# Patient Record
Sex: Female | Born: 1945
Health system: Southern US, Community
[De-identification: ages and names within clinical notes are randomized; demographics above are authoritative.]

## PROBLEM LIST (undated history)

## (undated) DIAGNOSIS — M1712 Unilateral primary osteoarthritis, left knee: Secondary | ICD-10-CM

## (undated) DIAGNOSIS — J45909 Unspecified asthma, uncomplicated: Secondary | ICD-10-CM

## (undated) DIAGNOSIS — I10 Essential (primary) hypertension: Secondary | ICD-10-CM

## (undated) DIAGNOSIS — G4700 Insomnia, unspecified: Secondary | ICD-10-CM

## (undated) DIAGNOSIS — M545 Low back pain, unspecified: Secondary | ICD-10-CM

## (undated) DIAGNOSIS — E785 Hyperlipidemia, unspecified: Secondary | ICD-10-CM

## (undated) DIAGNOSIS — E669 Obesity, unspecified: Secondary | ICD-10-CM

## (undated) HISTORY — DX: Low back pain, unspecified: M54.50

## (undated) HISTORY — DX: Insomnia, unspecified: G47.00

## (undated) HISTORY — PX: VAGINAL PROLAPSE REPAIR: SHX830

## (undated) HISTORY — DX: Unilateral primary osteoarthritis, left knee: M17.12

## (undated) HISTORY — DX: Hyperlipidemia, unspecified: E78.5

## (undated) HISTORY — DX: Low back pain: M54.5

## (undated) HISTORY — PX: OOPHORECTOMY: SHX86

## (undated) HISTORY — DX: Essential (primary) hypertension: I10

## (undated) HISTORY — PX: TUBAL LIGATION: SHX77

## (undated) HISTORY — DX: Obesity, unspecified: E66.9

## (undated) HISTORY — PX: ABDOMINAL HYSTERECTOMY: SHX81

---

## 2004-01-22 ENCOUNTER — Ambulatory Visit: Payer: Self-pay

## 2005-07-07 ENCOUNTER — Ambulatory Visit: Payer: Self-pay | Admitting: Family Medicine

## 2005-12-21 ENCOUNTER — Ambulatory Visit: Payer: Self-pay | Admitting: Unknown Physician Specialty

## 2006-07-21 ENCOUNTER — Ambulatory Visit: Payer: Self-pay

## 2006-08-04 ENCOUNTER — Ambulatory Visit: Payer: Self-pay | Admitting: Specialist

## 2006-10-12 ENCOUNTER — Ambulatory Visit: Payer: Self-pay | Admitting: Gastroenterology

## 2007-03-13 ENCOUNTER — Other Ambulatory Visit: Payer: Self-pay

## 2007-03-13 ENCOUNTER — Ambulatory Visit: Payer: Self-pay | Admitting: Unknown Physician Specialty

## 2007-03-22 ENCOUNTER — Inpatient Hospital Stay: Payer: Self-pay | Admitting: Unknown Physician Specialty

## 2007-09-27 ENCOUNTER — Ambulatory Visit: Payer: Self-pay | Admitting: Family Medicine

## 2009-09-24 DIAGNOSIS — M1710 Unilateral primary osteoarthritis, unspecified knee: Secondary | ICD-10-CM

## 2009-10-07 ENCOUNTER — Ambulatory Visit: Payer: Self-pay | Admitting: Family Medicine

## 2010-03-25 ENCOUNTER — Ambulatory Visit: Payer: Self-pay | Admitting: Family Medicine

## 2010-04-05 ENCOUNTER — Ambulatory Visit: Payer: Self-pay | Admitting: Orthopedic Surgery

## 2010-04-15 ENCOUNTER — Inpatient Hospital Stay: Payer: Self-pay | Admitting: Orthopedic Surgery

## 2010-04-18 HISTORY — PX: KNEE SURGERY: SHX244

## 2010-04-20 LAB — PATHOLOGY REPORT

## 2010-07-08 ENCOUNTER — Ambulatory Visit: Payer: Self-pay | Admitting: Orthopedic Surgery

## 2010-07-22 ENCOUNTER — Ambulatory Visit: Payer: Self-pay | Admitting: Pain Medicine

## 2010-08-04 ENCOUNTER — Ambulatory Visit: Payer: Self-pay | Admitting: Pain Medicine

## 2010-08-04 ENCOUNTER — Other Ambulatory Visit: Payer: Self-pay | Admitting: Pain Medicine

## 2010-08-05 ENCOUNTER — Ambulatory Visit: Payer: Self-pay | Admitting: Pain Medicine

## 2010-08-19 ENCOUNTER — Ambulatory Visit: Payer: Self-pay | Admitting: Pain Medicine

## 2010-08-30 ENCOUNTER — Ambulatory Visit: Payer: Self-pay | Admitting: Pain Medicine

## 2012-09-13 ENCOUNTER — Ambulatory Visit: Payer: Self-pay | Admitting: Family Medicine

## 2013-01-10 ENCOUNTER — Ambulatory Visit: Payer: Self-pay | Admitting: Family Medicine

## 2013-01-18 ENCOUNTER — Ambulatory Visit: Payer: Self-pay | Admitting: Family Medicine

## 2013-01-21 ENCOUNTER — Ambulatory Visit: Payer: Self-pay | Admitting: Family Medicine

## 2013-01-21 HISTORY — PX: BREAST BIOPSY: SHX20

## 2013-01-24 ENCOUNTER — Ambulatory Visit: Payer: Self-pay | Admitting: General Surgery

## 2013-02-05 ENCOUNTER — Ambulatory Visit: Payer: Self-pay | Admitting: General Surgery

## 2014-07-20 IMAGING — MG MM BREAST BX W/ LOC DEV 1ST LESION IMAGE BX SPEC STEREO GUIDE*R*
1 series · 1 of 1 positions shown · non-contrast
Comparison: Previous exams.

REASON FOR EXAM: rt calcs
COMMENTS:
CLINICAL DATA: Right breast calcifications from biopsy

EXAM:
STEREOTACTIC VACUUM ASSIST RIGHT

[R CC]
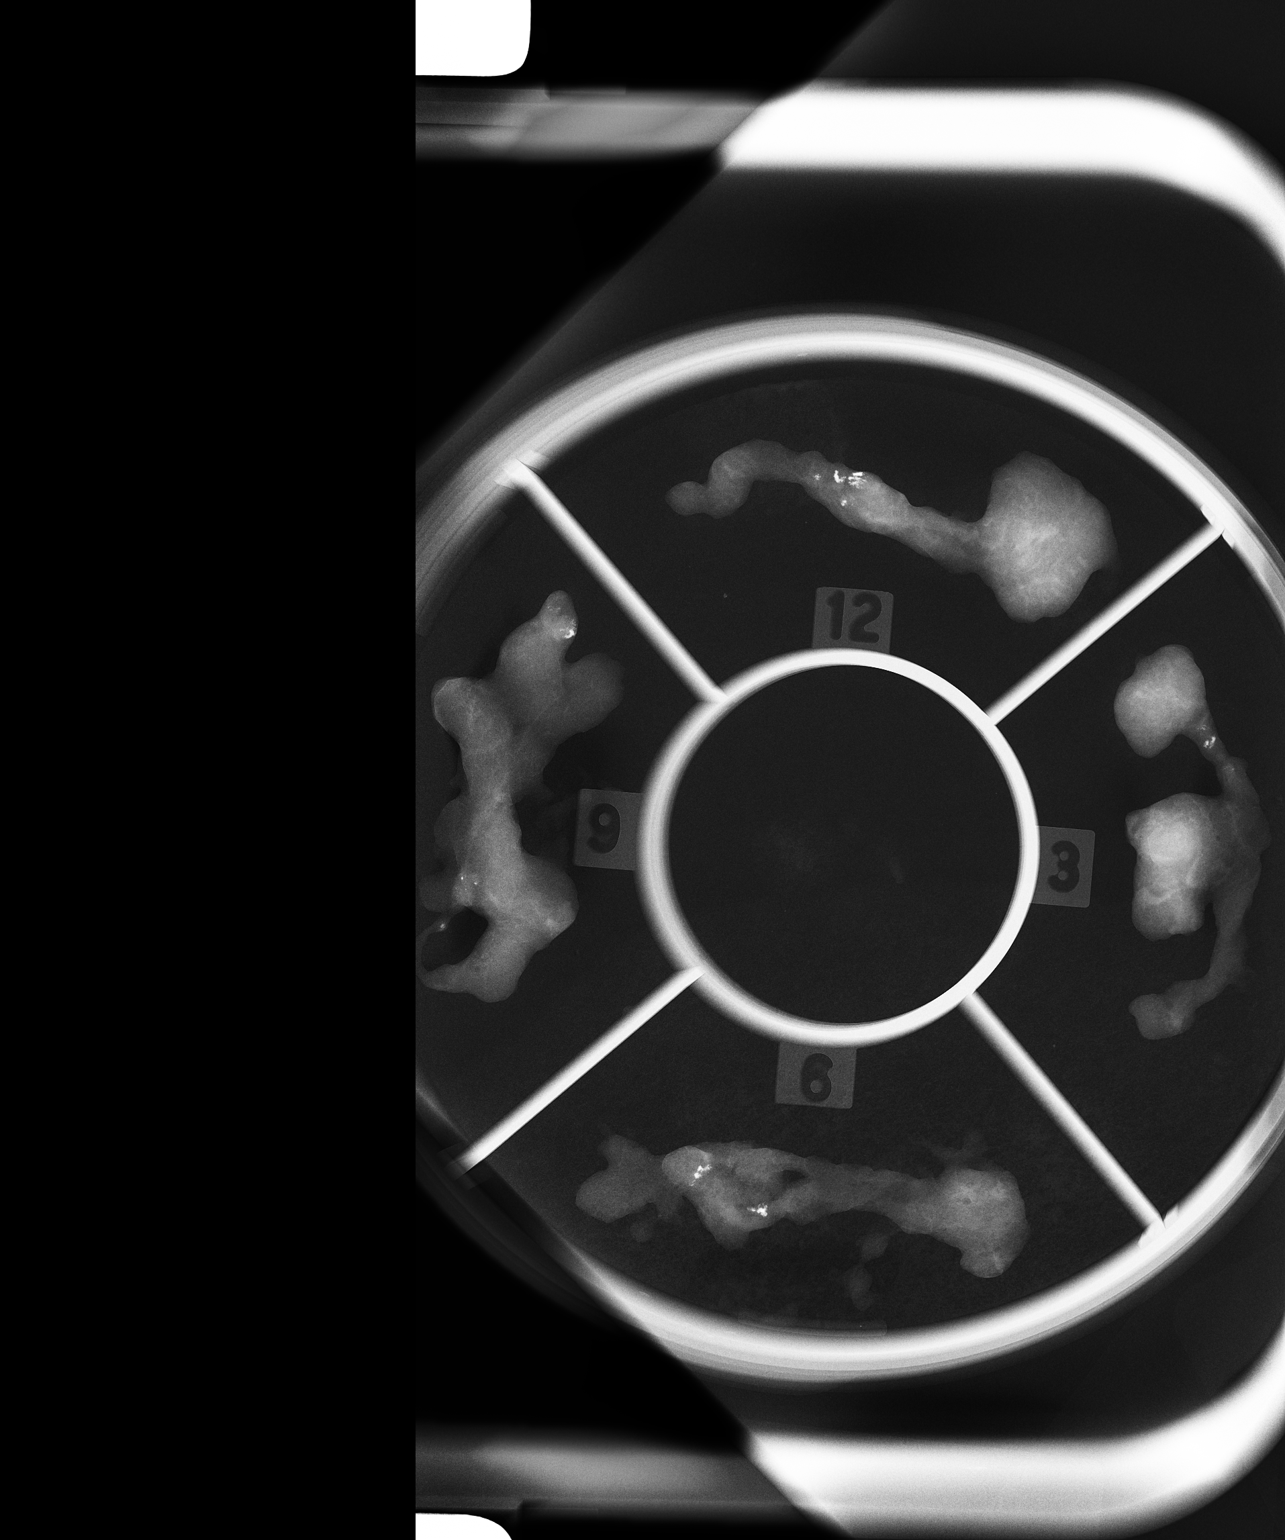

[1 of 1 positions shown; findings below may reference images not displayed]

PROCEDURE:     MAM - MAM STEREOTACTIC VACUUM ASSIST R  - January 21, 2013  [DATE]

ADDENDUM:
PATHOLOGY ADDENDUM:

Pathologyright breast calcifications: Benign fibroadenoma with
calcifications

Pathology concordance with imaging findings: Yes

Recommendation: Screening mammogram in January 2014

At the request of the patient, I spoke with her by telephone on
01/23/2013 at [DATE]. She reports doing well after the biopsy. She
reports no bleeding or bruising at the biopsy site..
FINDINGS: I met with the patient and we discussed the procedure of
stereotactic-guided biopsy, including benefits and alternatives. We
discussed the high likelihood of a successful procedure. We
discussed the risks of the procedure, including infection, bleeding,
tissue injury, clip migration, and inadequate sampling. Informed,
written consent was given.

Using sterile technique and 2% Lidocaine as local anesthetic, under
stereotactic guidance, a 9 gauge stereotactic vacuum assisted device
was used to perform core needle biopsy of calcifications in the
central posterior right breast using a medial approach. Specimen
radiograph was performed, showing inclusion of calcifications of
concern. Specimens with calcifications are identified for pathology.

At the conclusion of the procedure, a tissue marker clip was
deployed into the biopsy cavity. Follow-up 2-view mammogram
confirmed clip in the area of concern
IMPRESSION: Stereotactic-guided biopsy of right breast. No apparent
complications.

## 2014-08-28 DIAGNOSIS — H5210 Myopia, unspecified eye: Secondary | ICD-10-CM | POA: Diagnosis not present

## 2014-08-28 DIAGNOSIS — H524 Presbyopia: Secondary | ICD-10-CM | POA: Diagnosis not present

## 2014-10-27 ENCOUNTER — Encounter: Payer: Self-pay | Admitting: Family Medicine

## 2014-10-27 DIAGNOSIS — F5104 Psychophysiologic insomnia: Secondary | ICD-10-CM | POA: Insufficient documentation

## 2014-10-27 DIAGNOSIS — R51 Headache: Secondary | ICD-10-CM

## 2014-10-27 DIAGNOSIS — G4726 Circadian rhythm sleep disorder, shift work type: Secondary | ICD-10-CM | POA: Insufficient documentation

## 2014-10-27 DIAGNOSIS — G4700 Insomnia, unspecified: Secondary | ICD-10-CM | POA: Insufficient documentation

## 2014-10-27 DIAGNOSIS — E559 Vitamin D deficiency, unspecified: Secondary | ICD-10-CM | POA: Insufficient documentation

## 2014-10-27 DIAGNOSIS — R001 Bradycardia, unspecified: Secondary | ICD-10-CM | POA: Insufficient documentation

## 2014-10-27 DIAGNOSIS — E7800 Pure hypercholesterolemia, unspecified: Secondary | ICD-10-CM | POA: Insufficient documentation

## 2014-10-27 DIAGNOSIS — R519 Headache, unspecified: Secondary | ICD-10-CM | POA: Insufficient documentation

## 2014-10-27 DIAGNOSIS — I10 Essential (primary) hypertension: Secondary | ICD-10-CM | POA: Insufficient documentation

## 2014-10-27 DIAGNOSIS — E669 Obesity, unspecified: Secondary | ICD-10-CM | POA: Insufficient documentation

## 2014-10-30 ENCOUNTER — Ambulatory Visit (INDEPENDENT_AMBULATORY_CARE_PROVIDER_SITE_OTHER): Payer: BLUE CROSS/BLUE SHIELD | Admitting: Family Medicine

## 2014-10-30 ENCOUNTER — Encounter: Payer: Self-pay | Admitting: Family Medicine

## 2014-10-30 VITALS — BP 124/82 | HR 82 | Temp 97.8°F | Resp 18 | Ht 65.0 in | Wt 181.0 lb

## 2014-10-30 DIAGNOSIS — G8929 Other chronic pain: Secondary | ICD-10-CM | POA: Insufficient documentation

## 2014-10-30 DIAGNOSIS — Z1239 Encounter for other screening for malignant neoplasm of breast: Secondary | ICD-10-CM

## 2014-10-30 DIAGNOSIS — H6993 Unspecified Eustachian tube disorder, bilateral: Secondary | ICD-10-CM | POA: Diagnosis not present

## 2014-10-30 DIAGNOSIS — M170 Bilateral primary osteoarthritis of knee: Secondary | ICD-10-CM

## 2014-10-30 DIAGNOSIS — H6121 Impacted cerumen, right ear: Secondary | ICD-10-CM | POA: Diagnosis not present

## 2014-10-30 DIAGNOSIS — I10 Essential (primary) hypertension: Secondary | ICD-10-CM

## 2014-10-30 DIAGNOSIS — E7800 Pure hypercholesterolemia, unspecified: Secondary | ICD-10-CM

## 2014-10-30 DIAGNOSIS — G4726 Circadian rhythm sleep disorder, shift work type: Secondary | ICD-10-CM

## 2014-10-30 DIAGNOSIS — E559 Vitamin D deficiency, unspecified: Secondary | ICD-10-CM

## 2014-10-30 DIAGNOSIS — F5104 Psychophysiologic insomnia: Secondary | ICD-10-CM

## 2014-10-30 DIAGNOSIS — M545 Low back pain, unspecified: Secondary | ICD-10-CM | POA: Insufficient documentation

## 2014-10-30 MED ORDER — PRAVASTATIN SODIUM 40 MG PO TABS: 40.0000 mg | ORAL_TABLET | Freq: Every day | ORAL | Status: DC

## 2014-10-30 MED ORDER — ZOLPIDEM TARTRATE ER 12.5 MG PO TBCR: 12.5000 mg | EXTENDED_RELEASE_TABLET | Freq: Every day | ORAL | Status: DC

## 2014-10-30 MED ORDER — LISINOPRIL-HYDROCHLOROTHIAZIDE 20-12.5 MG PO TABS: 1.0000 | ORAL_TABLET | Freq: Two times a day (BID) | ORAL | Status: DC

## 2014-10-30 MED ORDER — TRAMADOL HCL 50 MG PO TABS: 50.0000 mg | ORAL_TABLET | Freq: Two times a day (BID) | ORAL | Status: DC | PRN

## 2014-10-30 MED ORDER — CYCLOBENZAPRINE HCL 10 MG PO TABS: 10.0000 mg | ORAL_TABLET | ORAL | Status: DC | PRN

## 2014-10-30 MED ORDER — FLUTICASONE PROPIONATE 50 MCG/ACT NA SUSP: 2.0000 | Freq: Every day | NASAL | Status: DC

## 2014-10-30 MED ORDER — AMLODIPINE BESYLATE 5 MG PO TABS: 5.0000 mg | ORAL_TABLET | Freq: Every day | ORAL | Status: DC

## 2014-10-30 NOTE — Patient Instructions (Signed)
Barotitis Media Barotitis media is inflammation of your middle ear. This occurs when the auditory tube (eustachian tube) leading from the back of your nose (nasopharynx) to your eardrum is blocked. This blockage may result from a cold, environmental allergies, or an upper respiratory infection. Unresolved barotitis media may lead to damage or hearing loss (barotrauma), which may become permanent. HOME CARE INSTRUCTIONS   Use medicines as recommended by your health care provider. Over-the-counter medicines will help unblock the canal and can help during times of air travel.  Do not put anything into your ears to clean or unplug them. Eardrops will not be helpful.  Do not swim, dive, or fly until your health care provider says it is all right to do so. If these activities are necessary, chewing gum with frequent, forceful swallowing may help. It is also helpful to hold your nose and gently blow to pop your ears for equalizing pressure changes. This forces air into the eustachian tube.  Only take over-the-counter or prescription medicines for pain, discomfort, or fever as directed by your health care provider.  A decongestant may be helpful in decongesting the middle ear and make pressure equalization easier. SEEK MEDICAL CARE IF:  You experience a serious form of dizziness in which you feel as if the room is spinning and you feel nauseated (vertigo).  Your symptoms only involve one ear. SEEK IMMEDIATE MEDICAL CARE IF:   You develop a severe headache, dizziness, or severe ear pain.  You have bloody or pus-like drainage from your ears.  You develop a fever.  Your problems do not improve or become worse. MAKE SURE YOU:   Understand these instructions.  Will watch your condition.  Will get help right away if you are not doing well or get worse. Document Released: 04/01/2000 Document Revised: 01/23/2013 Document Reviewed: 10/30/2012 Exit[employer] Patient Information 2015 [employer]. This  information is not intended to replace advice given to you by your health care provider. Make sure you discuss any questions you have with your health care provider.  

## 2014-10-30 NOTE — Progress Notes (Signed)
Name: Hayley Howe   MRN: [id number]    DOB: April 23, 1945   Date:10/30/2014       Progress Note  Subjective  Chief Complaint  Chief Complaint  Patient presents with  . Hypertension  . Hyperlipidemia  . Insomnia    pt states ambien not helping total sleep 6hrs, waking up and cannot go back to sleep  . Back Pain    unchanged  . Cerumen Impaction    possibly she states from cold    HPI  HTN: taking medication, denies side effects of medication, bp is at goal.  Hyperlipidemia: taking pravastatin and denies side effects of medication, due for labs  Chronic Low Back: she has a long history of low back pain, not secondary to trauma, DDD lumbar spine, taking Tramadol daily to control symptoms. Occasionally the pain radiates to right thigh, usually is described as a aching pain and sometimes sharp.  Pain at this time is 2/10.  Insomnia/Shift Work Sleep Disorder: she sleeps for about 6.5 hours when she sleeps during the day and about 5 hours during the night. Works third shift. Taking Ambien  Ear fullness: she states she had a cold /allergies for about one month and ears have been stopped up, feeling better but still not back to normal. Right is worse than left side  Patient Active Problem List   Diagnosis Date Noted  . Chronic low back pain 10/30/2014  . Benign essential HTN 10/27/2014  . Bradycardia 10/27/2014  . Insomnia 10/27/2014  . Headache, temporal 10/27/2014  . Hypercholesteremia 10/27/2014  . Obesity (BMI 30-39.9) 10/27/2014  . Changing sleep-work schedule, affecting sleep 10/27/2014  . Vitamin D deficiency 10/27/2014  . Arthritis of knee, degenerative 09/24/2009    No past surgical history on file.  No family history on file.  History   Social History  . Marital Status: Widowed    Spouse Name: N/A  . Number of Children: N/A  . Years of Education: N/A   Occupational History  . Not on file.   Social History Main Topics  . Smoking status: Former Research scientist (life sciences)  .  Smokeless tobacco: Never Used  . Alcohol Use: No  . Drug Use: No  . Sexual Activity: Not Currently   Other Topics Concern  . Not on file   Social History Narrative  . No narrative on file     Current outpatient prescriptions:  .  amLODipine (NORVASC) 5 MG tablet, Take 1 tablet (5 mg total) by mouth daily., Disp: 30 tablet, Rfl: 2 .  aspirin 81 MG tablet, Take 1 tablet by mouth daily., Disp: , Rfl:  .  Cholecalciferol (VITAMIN D) 2000 UNITS tablet, Take 1 tablet by mouth daily., Disp: , Rfl:  .  cyclobenzaprine (FLEXERIL) 10 MG tablet, Take 1 tablet (10 mg total) by mouth as needed., Disp: 30 tablet, Rfl: 2 .  lisinopril-hydrochlorothiazide (PRINZIDE,ZESTORETIC) 20-12.5 MG per tablet, Take 1 tablet by mouth 2 (two) times daily., Disp: 60 tablet, Rfl: 2 .  pravastatin (PRAVACHOL) 40 MG tablet, Take 1 tablet (40 mg total) by mouth daily., Disp: 30 tablet, Rfl: 2 .  traMADol (ULTRAM) 50 MG tablet, Take 1 tablet (50 mg total) by mouth 2 (two) times daily as needed., Disp: 60 tablet, Rfl: 2 .  zolpidem (AMBIEN CR) 12.5 MG CR tablet, Take 1 tablet (12.5 mg total) by mouth at bedtime., Disp: 30 tablet, Rfl: 2  No Known Allergies   ROS  Constitutional: Negative for fever or significant weight change.  Respiratory: Negative  for cough and shortness of breath.   Cardiovascular: Negative for chest pain or palpitations.  Gastrointestinal: Negative for abdominal pain, no bowel changes.  Musculoskeletal: Negative for gait problem or joint swelling.  Skin: Negative for rash.  Neurological: Negative for dizziness or headache.  No other specific complaints in a complete review of systems (except as listed in HPI above).  Objective  Filed Vitals:   10/30/14 1346  BP: 124/82  Pulse: 82  Temp: 97.8 F (36.6 C)  TempSrc: Oral  Resp: 18  Height: 5\' 5"  (1.651 m)  Weight: 181 lb (82.101 kg)  SpO2: 97%    Body mass index is 30.12 kg/(m^2).  Physical Exam  Constitutional: Patient appears  well-developed and well-nourished. No distress. Obese Eyes:  No scleral icterus. PERL Ears: cerumen impaction on right side Neck: Normal range of motion. Neck supple.  Cardiovascular: Normal rate, regular rhythm and normal heart sounds.  No murmur heard. No BLE edema. Pulmonary/Chest: Effort normal and breath sounds normal. No respiratory distress. Abdominal: Soft.  There is no tenderness. Psychiatric: Patient has a normal mood and affect. behavior is normal. Judgment and thought content normal.   PHQ2/9: Depression screen PHQ 2/9 10/30/2014  Decreased Interest 0  Down, Depressed, Hopeless 1  PHQ - 2 Score 1    Fall Risk: Fall Risk  10/30/2014  Falls in the past year? No     Assessment & Plan  1. Benign essential HTN At goal continue medication  - Comprehensive Metabolic Panel (CMET) - lisinopril-hydrochlorothiazide (PRINZIDE,ZESTORETIC) 20-12.5 MG per tablet; Take 1 tablet by mouth 2 (two) times daily.  Dispense: 60 tablet; Refill: 2 - amLODipine (NORVASC) 5 MG tablet; Take 1 tablet (5 mg total) by mouth daily.  Dispense: 30 tablet; Refill: 2  2. Hypercholesteremia Recheck labs - Lipid Profile - pravastatin (PRAVACHOL) 40 MG tablet; Take 1 tablet (40 mg total) by mouth daily.  Dispense: 30 tablet; Refill: 2  3. Vitamin D deficiency  - Vitamin D (25 hydroxy)  4. Primary osteoarthritis of both knees Discussed quad strength exercises  5. Chronic low back pain  - traMADol (ULTRAM) 50 MG tablet; Take 1 tablet (50 mg total) by mouth 2 (two) times daily as needed.  Dispense: 60 tablet; Refill: 2 - cyclobenzaprine (FLEXERIL) 10 MG tablet; Take 1 tablet (10 mg total) by mouth as needed.  Dispense: 30 tablet; Refill: 2  6. Changing sleep-work schedule, affecting sleep Continue Ambien for sleep   7. Chronic insomnia  - zolpidem (AMBIEN CR) 12.5 MG CR tablet; Take 1 tablet (12.5 mg total) by mouth at bedtime.  Dispense: 30 tablet; Refill: 2  8. Breast cancer screening  -  MM Digital Screening; Future  9. Cerumen impaction, right  - Ear Lavage Verbal consent given by patient She tolerated procedure well, elephant ear lavage with warm water and peroxide No complications   10. Eustachian tube disorder, bilateral  - fluticasone (FLONASE) 50 MCG/ACT nasal spray; Place 2 sprays into both nostrils daily.  Dispense: 16 g; Refill: 0

## 2014-10-31 ENCOUNTER — Telehealth: Payer: Self-pay

## 2014-10-31 LAB — COMPREHENSIVE METABOLIC PANEL
A/G RATIO: 1.3 (ref 1.1–2.5)
ALBUMIN: 4.6 g/dL (ref 3.6–4.8)
ALK PHOS: 57 IU/L (ref 39–117)
ALT: 20 IU/L (ref 0–32)
AST: 21 IU/L (ref 0–40)
BUN / CREAT RATIO: 21 (ref 11–26)
BUN: 19 mg/dL (ref 8–27)
Bilirubin Total: 0.2 mg/dL (ref 0.0–1.2)
CO2: 27 mmol/L (ref 18–29)
Calcium: 9.8 mg/dL (ref 8.7–10.3)
Chloride: 98 mmol/L (ref 97–108)
Creatinine, Ser: 0.89 mg/dL (ref 0.57–1.00)
GFR calc Af Amer: 78 mL/min/{1.73_m2} (ref >59)
GFR calc non Af Amer: 67 mL/min/{1.73_m2} (ref >59)
Globulin, Total: 3.5 g/dL (ref 1.5–4.5)
Glucose: 93 mg/dL (ref 65–99)
Potassium: 3.8 mmol/L (ref 3.5–5.2)
Sodium: 141 mmol/L (ref 134–144)
TOTAL PROTEIN: 8.1 g/dL (ref 6.0–8.5)

## 2014-10-31 LAB — LIPID PANEL
CHOL/HDL RATIO: 2.2 ratio (ref 0.0–4.4)
Cholesterol, Total: 197 mg/dL (ref 100–199)
HDL: 89 mg/dL (ref >39)
LDL Calculated: 97 mg/dL (ref 0–99)
Triglycerides: 57 mg/dL (ref 0–149)
VLDL CHOLESTEROL CAL: 11 mg/dL (ref 5–40)

## 2014-10-31 LAB — VITAMIN D 25 HYDROXY (VIT D DEFICIENCY, FRACTURES): Vit D, 25-Hydroxy: 32.5 ng/mL (ref 30.0–100.0)

## 2014-10-31 NOTE — Telephone Encounter (Signed)
Left message for patient to return my call due to labs.

## 2014-10-31 NOTE — Telephone Encounter (Signed)
-----   Message from Steele Sizer, MD sent at 10/31/2014 10:02 AM EDT ----- Labs reviewed and within normal limits Continue current medications Please notify patient, thank you

## 2014-11-03 ENCOUNTER — Telehealth: Payer: Self-pay

## 2014-11-03 NOTE — Telephone Encounter (Signed)
Left vm for patient to return my call for lab results. 

## 2014-11-03 NOTE — Telephone Encounter (Signed)
-----   Message from Steele Sizer, MD sent at 10/31/2014 10:02 AM EDT ----- Labs reviewed and within normal limits Continue current medications Please notify patient, thank you

## 2014-11-04 NOTE — Progress Notes (Signed)
Patient notified

## 2015-01-26 ENCOUNTER — Other Ambulatory Visit: Payer: Self-pay | Admitting: Family Medicine

## 2015-01-26 NOTE — Telephone Encounter (Signed)
Patient requesting refill. 

## 2015-02-25 DIAGNOSIS — M4696 Unspecified inflammatory spondylopathy, lumbar region: Secondary | ICD-10-CM | POA: Diagnosis not present

## 2015-02-27 ENCOUNTER — Other Ambulatory Visit: Payer: Self-pay | Admitting: Family Medicine

## 2015-03-05 ENCOUNTER — Ambulatory Visit (INDEPENDENT_AMBULATORY_CARE_PROVIDER_SITE_OTHER): Payer: BLUE CROSS/BLUE SHIELD | Admitting: Family Medicine

## 2015-03-05 ENCOUNTER — Encounter: Payer: Self-pay | Admitting: Family Medicine

## 2015-03-05 VITALS — BP 136/82 | HR 100 | Temp 98.3°F | Resp 14 | Ht 65.0 in | Wt 181.9 lb

## 2015-03-05 DIAGNOSIS — Z23 Encounter for immunization: Secondary | ICD-10-CM | POA: Diagnosis not present

## 2015-03-05 DIAGNOSIS — Z1231 Encounter for screening mammogram for malignant neoplasm of breast: Secondary | ICD-10-CM

## 2015-03-05 DIAGNOSIS — G8929 Other chronic pain: Secondary | ICD-10-CM | POA: Diagnosis not present

## 2015-03-05 DIAGNOSIS — M545 Low back pain, unspecified: Secondary | ICD-10-CM

## 2015-03-05 DIAGNOSIS — I10 Essential (primary) hypertension: Secondary | ICD-10-CM

## 2015-03-05 DIAGNOSIS — G4700 Insomnia, unspecified: Secondary | ICD-10-CM | POA: Diagnosis not present

## 2015-03-05 DIAGNOSIS — E7800 Pure hypercholesterolemia, unspecified: Secondary | ICD-10-CM

## 2015-03-05 DIAGNOSIS — M170 Bilateral primary osteoarthritis of knee: Secondary | ICD-10-CM | POA: Diagnosis not present

## 2015-03-05 DIAGNOSIS — H6993 Unspecified Eustachian tube disorder, bilateral: Secondary | ICD-10-CM | POA: Diagnosis not present

## 2015-03-05 DIAGNOSIS — J302 Other seasonal allergic rhinitis: Secondary | ICD-10-CM | POA: Diagnosis not present

## 2015-03-05 DIAGNOSIS — F5104 Psychophysiologic insomnia: Secondary | ICD-10-CM

## 2015-03-05 MED ORDER — LISINOPRIL-HYDROCHLOROTHIAZIDE 20-12.5 MG PO TABS: 1.0000 | ORAL_TABLET | Freq: Two times a day (BID) | ORAL | Status: DC

## 2015-03-05 MED ORDER — FLUTICASONE PROPIONATE 50 MCG/ACT NA SUSP: 2.0000 | Freq: Every day | NASAL | Status: DC

## 2015-03-05 MED ORDER — LORATADINE 10 MG PO TABS: 10.0000 mg | ORAL_TABLET | Freq: Every day | ORAL | Status: DC

## 2015-03-05 MED ORDER — TRAMADOL HCL 50 MG PO TABS: 50.0000 mg | ORAL_TABLET | Freq: Two times a day (BID) | ORAL | Status: DC | PRN

## 2015-03-05 MED ORDER — ZOLPIDEM TARTRATE ER 12.5 MG PO TBCR: 12.5000 mg | EXTENDED_RELEASE_TABLET | Freq: Every day | ORAL | Status: DC

## 2015-03-05 MED ORDER — AMLODIPINE BESYLATE 5 MG PO TABS: 5.0000 mg | ORAL_TABLET | Freq: Every day | ORAL | Status: DC

## 2015-03-05 MED ORDER — PRAVASTATIN SODIUM 40 MG PO TABS: 40.0000 mg | ORAL_TABLET | Freq: Every day | ORAL | Status: DC

## 2015-03-05 NOTE — Progress Notes (Signed)
Name: Hayley Howe   MRN: [id number]    DOB: 1946/01/21   Date:03/05/2015       Progress Note  Subjective  Chief Complaint  Chief Complaint  Patient presents with  . Medication Refill    follow-up  . Hypertension    some headaches  . Hyperlipidemia  . Insomnia  . Pain    back and neck  . URI    onset 4 day unchanged, syptoms runny nose coughing, scratchy throat.  Taking otc meds    HPI   HTN: taking medication, denies side effects of medication, bp is at goal, no chest pain or palpitation   Hyperlipidemia: taking pravastatin and denies side effects of medication, labs reviewed HDL excellent and LDL is at goal   Chronic Low Back: she has a long history of low back pain, not secondary to trauma, DDD lumbar spine, taking Tramadol daily to control symptoms. Occasionally the pain radiates to right thigh, usually is described as a aching pain and sometimes sharp. Pain at this time is 0/10 at this time, but she was off last night from work.  Had a flare within the past couple of weeks and had a trigger point injection done by Dr. Rudene Christians, she states symptoms have improved since.   Insomnia/Shift Work Sleep Disorder: she sleeps for about 6.5 hours when she sleeps during the day and about 5 hours during the night. Works third shift. Taking Ambien, denies side effects of medication   AR: she states that for the past month or so, she has noticed rhinorrhea, nasal congestion, ear fullness at times, and post-nasal drainage,no fever, no chills, no change in appetite.   Patient Active Problem List   Diagnosis Date Noted  . Chronic low back pain 10/30/2014  . Benign essential HTN 10/27/2014  . Bradycardia 10/27/2014  . Insomnia 10/27/2014  . Headache, temporal 10/27/2014  . Hypercholesteremia 10/27/2014  . Obesity (BMI 30-39.9) 10/27/2014  . Changing sleep-work schedule, affecting sleep 10/27/2014  . Vitamin D deficiency 10/27/2014  . Arthritis of knee, degenerative 09/24/2009     History reviewed. No pertinent past surgical history.  History reviewed. No pertinent family history.  Social History   Social History  . Marital Status: Widowed    Spouse Name: N/A  . Number of Children: N/A  . Years of Education: N/A   Occupational History  . Not on file.   Social History Main Topics  . Smoking status: Former Research scientist (life sciences)  . Smokeless tobacco: Never Used  . Alcohol Use: No  . Drug Use: No  . Sexual Activity: Not Currently   Other Topics Concern  . Not on file   Social History Narrative     Current outpatient prescriptions:  .  amLODipine (NORVASC) 5 MG tablet, Take 1 tablet (5 mg total) by mouth daily., Disp: 30 tablet, Rfl: 5 .  aspirin 81 MG tablet, Take 1 tablet by mouth daily., Disp: , Rfl:  .  Cholecalciferol (VITAMIN D) 2000 UNITS tablet, Take 1 tablet by mouth daily., Disp: , Rfl:  .  cyclobenzaprine (FLEXERIL) 10 MG tablet, Take 1 tablet (10 mg total) by mouth as needed., Disp: 30 tablet, Rfl: 2 .  fluticasone (FLONASE) 50 MCG/ACT nasal spray, Place 2 sprays into both nostrils daily., Disp: 16 g, Rfl: 2 .  lisinopril-hydrochlorothiazide (PRINZIDE,ZESTORETIC) 20-12.5 MG tablet, Take 1 tablet by mouth 2 (two) times daily., Disp: 60 tablet, Rfl: 5 .  Magnesium 250 MG TABS, Take 1 tablet by mouth daily., Disp: , Rfl:  .  pravastatin (PRAVACHOL) 40 MG tablet, Take 1 tablet (40 mg total) by mouth daily., Disp: 30 tablet, Rfl: 5 .  traMADol (ULTRAM) 50 MG tablet, Take 1 tablet (50 mg total) by mouth 2 (two) times daily as needed., Disp: 60 tablet, Rfl: 2 .  vitamin C (ASCORBIC ACID) 500 MG tablet, Take 1 tablet by mouth daily., Disp: , Rfl:  .  zolpidem (AMBIEN CR) 12.5 MG CR tablet, Take 1 tablet (12.5 mg total) by mouth at bedtime., Disp: 30 tablet, Rfl: 2  No Known Allergies   ROS  Constitutional: Negative for fever or weight change.  Respiratory: Mild cough from post-nasal drainage and shortness of breath.   Cardiovascular: Negative for chest  pain or palpitations.  Gastrointestinal: Negative for abdominal pain, no bowel changes.  Musculoskeletal: Negative for gait problem or joint swelling.  Skin: Negative for rash.  Neurological: Negative for dizziness or headache.  No other specific complaints in a complete review of systems (except as listed in HPI above).  Objective  Filed Vitals:   03/05/15 1333  BP: 136/82  Pulse: 100  Temp: 98.3 F (36.8 C)  TempSrc: Oral  Resp: 14  Height: 5\' 5"  (1.651 m)  Weight: 181 lb 14.4 oz (82.509 kg)  SpO2: 97%    Body mass index is 30.27 kg/(m^2).  Physical Exam  Constitutional: Patient appears well-developed and well-nourished. Obese  No distress.  HEENT: head atraumatic, normocephalic, pupils equal and reactive to light, ears normal TM neck supple, throat within normal limits, boggy turbinates, clear rhinorrhea Cardiovascular: Normal rate, regular rhythm and normal heart sounds.  No murmur heard. No BLE edema. Pulmonary/Chest: Effort normal and breath sounds normal. No respiratory distress. Abdominal: Soft.  There is no tenderness. Psychiatric: Patient has a normal mood and affect. behavior is normal. Judgment and thought content normal. Muscular skeletal: no pain during palpation of lumbar spine, negative straight leg raise  PHQ2/9: Depression screen [facility] 2/9 03/05/2015 10/30/2014  Decreased Interest 0 0  Down, Depressed, Hopeless 0 1  PHQ - 2 Score 0 1    Fall Risk: Fall Risk  03/05/2015 10/30/2014  Falls in the past year? No No    Functional Status Survey: Is the patient deaf or have difficulty hearing?: No Does the patient have difficulty seeing, even when wearing glasses/contacts?: Yes (glasses) Does the patient have difficulty concentrating, remembering, or making decisions?: No Does the patient have difficulty walking or climbing stairs?: No Does the patient have difficulty dressing or bathing?: No Does the patient have difficulty doing errands alone such as  visiting a doctor's office or shopping?: No    Assessment & Plan  1. Benign essential HTN  At goal, continue medications - lisinopril-hydrochlorothiazide (PRINZIDE,ZESTORETIC) 20-12.5 MG tablet; Take 1 tablet by mouth 2 (two) times daily.  Dispense: 60 tablet; Refill: 5 - amLODipine (NORVASC) 5 MG tablet; Take 1 tablet (5 mg total) by mouth daily.  Dispense: 30 tablet; Refill: 5  2. Needs flu shot  - Flu Vaccine QUAD 36+ mos PF IM (Fluarix & Fluzone Quad PF)  3. Primary osteoarthritis of both knees  Continue follow up with Dr. Rudene Christians  4. Hypercholesteremia  - pravastatin (PRAVACHOL) 40 MG tablet; Take 1 tablet (40 mg total) by mouth daily.  Dispense: 30 tablet; Refill: 5  5. Chronic low back pain  - traMADol (ULTRAM) 50 MG tablet; Take 1 tablet (50 mg total) by mouth 2 (two) times daily as needed.  Dispense: 60 tablet; Refill: 2  6. Chronic insomnia  - zolpidem (AMBIEN  CR) 12.5 MG CR tablet; Take 1 tablet (12.5 mg total) by mouth at bedtime.  Dispense: 30 tablet; Refill: 2   7. Eustachian tube disorder, bilateral  - fluticasone (FLONASE) 50 MCG/ACT nasal spray; Place 2 sprays into both nostrils daily.  Dispense: 16 g; Refill: 2  8. Encounter for screening mammogram for breast cancer  - MM Digital Screening; Future  9. Seasonal allergic rhinitis  We will add Loratadine 10 mg daily, also advised to resume Flonase every night and may use saline spray - fluticasone (FLONASE) 50 MCG/ACT nasal spray; Place 2 sprays into both nostrils daily.  Dispense: 16 g; Refill: 2

## 2015-06-09 ENCOUNTER — Other Ambulatory Visit: Payer: Self-pay | Admitting: Family Medicine

## 2015-06-09 NOTE — Telephone Encounter (Signed)
Patient requesting refill. 

## 2015-06-12 ENCOUNTER — Ambulatory Visit
Admission: RE | Admit: 2015-06-12 | Discharge: 2015-06-12 | Disposition: A | Discharge status: Home / Self Care | Payer: BLUE CROSS/BLUE SHIELD | Source: Ambulatory Visit | Attending: Family Medicine | Admitting: Family Medicine

## 2015-06-12 DIAGNOSIS — Z1231 Encounter for screening mammogram for malignant neoplasm of breast: Secondary | ICD-10-CM | POA: Diagnosis present

## 2015-07-02 ENCOUNTER — Ambulatory Visit (INDEPENDENT_AMBULATORY_CARE_PROVIDER_SITE_OTHER): Payer: BLUE CROSS/BLUE SHIELD | Admitting: Family Medicine

## 2015-07-02 ENCOUNTER — Encounter: Payer: Self-pay | Admitting: Family Medicine

## 2015-07-02 VITALS — BP 118/64 | HR 87 | Temp 98.6°F | Resp 18 | Ht 65.0 in | Wt 178.8 lb

## 2015-07-02 DIAGNOSIS — G4700 Insomnia, unspecified: Secondary | ICD-10-CM

## 2015-07-02 DIAGNOSIS — E7800 Pure hypercholesterolemia, unspecified: Secondary | ICD-10-CM | POA: Diagnosis not present

## 2015-07-02 DIAGNOSIS — J302 Other seasonal allergic rhinitis: Secondary | ICD-10-CM

## 2015-07-02 DIAGNOSIS — E669 Obesity, unspecified: Secondary | ICD-10-CM

## 2015-07-02 DIAGNOSIS — R011 Cardiac murmur, unspecified: Secondary | ICD-10-CM

## 2015-07-02 DIAGNOSIS — G8929 Other chronic pain: Secondary | ICD-10-CM

## 2015-07-02 DIAGNOSIS — E559 Vitamin D deficiency, unspecified: Secondary | ICD-10-CM

## 2015-07-02 DIAGNOSIS — I10 Essential (primary) hypertension: Secondary | ICD-10-CM | POA: Diagnosis not present

## 2015-07-02 DIAGNOSIS — Z23 Encounter for immunization: Secondary | ICD-10-CM

## 2015-07-02 DIAGNOSIS — F5104 Psychophysiologic insomnia: Secondary | ICD-10-CM

## 2015-07-02 DIAGNOSIS — M170 Bilateral primary osteoarthritis of knee: Secondary | ICD-10-CM

## 2015-07-02 DIAGNOSIS — M545 Low back pain, unspecified: Secondary | ICD-10-CM

## 2015-07-02 DIAGNOSIS — R0989 Other specified symptoms and signs involving the circulatory and respiratory systems: Secondary | ICD-10-CM

## 2015-07-02 MED ORDER — AMLODIPINE BESYLATE 5 MG PO TABS: 5.0000 mg | ORAL_TABLET | Freq: Every day | ORAL | Status: DC

## 2015-07-02 MED ORDER — PRAVASTATIN SODIUM 40 MG PO TABS: 40.0000 mg | ORAL_TABLET | Freq: Every day | ORAL | Status: DC

## 2015-07-02 MED ORDER — TRAMADOL HCL 50 MG PO TABS: 50.0000 mg | ORAL_TABLET | Freq: Two times a day (BID) | ORAL | Status: DC | PRN

## 2015-07-02 MED ORDER — LISINOPRIL-HYDROCHLOROTHIAZIDE 20-12.5 MG PO TABS
1.0000 | ORAL_TABLET | Freq: Two times a day (BID) | ORAL | Status: DC
Start: 2015-07-02 — End: 2015-11-05

## 2015-07-02 MED ORDER — ZOLPIDEM TARTRATE ER 12.5 MG PO TBCR: 12.5000 mg | EXTENDED_RELEASE_TABLET | Freq: Every day | ORAL | Status: DC

## 2015-07-02 MED ORDER — METAXALONE 800 MG PO TABS
800.0000 mg | ORAL_TABLET | Freq: Three times a day (TID) | ORAL | Status: DC | PRN
Start: 2015-07-02 — End: 2015-11-05

## 2015-07-02 NOTE — Progress Notes (Signed)
Name: Hayley Howe   MRN: [id number]    DOB: 09-07-1945   Date:07/02/2015       Progress Note  Subjective  Chief Complaint  Chief Complaint  Patient presents with  . Medication Refill    4 month F/U  . Hypertension  . Insomnia    Doing well on medication, sleeping 6 hours nightly on average-has trouble staying asleep  . Allergic Rhinitis     Due to weather change her allergies have been inflammed with bloody mucus, sneezing, coughing and nasal congestion  . Hyperlipidemia    Muscles cramps in her thigh, but is taking otc magnesium to help with symptoms  . Spasms    Well controlled    HPI    HTN: taking medication, denies side effects of medication, bp is at goal, no chest pain or palpitation. She denies orthostatic changes, still works full time. BP at home has been normal ( but she does not recall the value)    Hyperlipidemia: taking pravastatin and denies side effects of medication, labs reviewed HDL excellent and LDL is at goal   Chronic Low Back: she has a long history of low back pain, not secondary to trauma, DDD lumbar spine, taking Tramadol daily to control symptoms and Flexeril prn - she has noticed some drowsiness when she takes Flexeril.  Occasionally the pain radiates to right thigh, usually is described as a aching pain and sometimes sharp. Pain at this time is 1/10 at this time, but she was off last night from work. Pain at the end of her shift can be 7/10.   Insomnia/Shift Work Sleep Disorder: she sleeps for about 7 hours when she sleeps during the day and about 5 hours during the night even with Ambien  Works third shift. Taking Ambien, denies side effects of medication   AR: she takes Loratadine, at this time she has rhinorrhea, she had one episode of nose bleed a couple of weeks ago but it resolved.   OA: she has aching pain on left knee, s/p knee replaced, only triggered by kneeling down, surgery was done by Dr. Rudene Christians in 2012  Patient Active Problem List   Diagnosis Date Noted  . Chronic low back pain 10/30/2014  . Benign essential HTN 10/27/2014  . Bradycardia 10/27/2014  . Insomnia 10/27/2014  . Headache, temporal 10/27/2014  . Hypercholesteremia 10/27/2014  . Obesity (BMI 30-39.9) 10/27/2014  . Changing sleep-work schedule, affecting sleep 10/27/2014  . Vitamin D deficiency 10/27/2014  . Arthritis of knee, degenerative 09/24/2009    Past Surgical History  Procedure Laterality Date  . Breast biopsy Right 01/21/2013    stereo - benign    Family History  Problem Relation Age of Onset  . Breast cancer Sister 37    in year 2013    Social History   Social History  . Marital Status: Widowed    Spouse Name: N/A  . Number of Children: N/A  . Years of Education: N/A   Occupational History  . Not on file.   Social History Main Topics  . Smoking status: Former Research scientist (life sciences)  . Smokeless tobacco: Never Used  . Alcohol Use: No  . Drug Use: No  . Sexual Activity: Not Currently   Other Topics Concern  . Not on file   Social History Narrative     Current outpatient prescriptions:  .  amLODipine (NORVASC) 5 MG tablet, Take 1 tablet (5 mg total) by mouth daily., Disp: 30 tablet, Rfl: 3 .  aspirin 81  MG tablet, Take 1 tablet by mouth daily., Disp: , Rfl:  .  Cholecalciferol (VITAMIN D) 2000 UNITS tablet, Take 1 tablet by mouth daily., Disp: , Rfl:  .  fluticasone (FLONASE) 50 MCG/ACT nasal spray, Place 2 sprays into both nostrils daily., Disp: 16 g, Rfl: 2 .  lisinopril-hydrochlorothiazide (PRINZIDE,ZESTORETIC) 20-12.5 MG tablet, Take 1 tablet by mouth 2 (two) times daily., Disp: 60 tablet, Rfl: 3 .  loratadine (CLARITIN) 10 MG tablet, Take 1 tablet (10 mg total) by mouth daily., Disp: 30 tablet, Rfl: 5 .  Magnesium 250 MG TABS, Take 1 tablet by mouth daily., Disp: , Rfl:  .  pravastatin (PRAVACHOL) 40 MG tablet, Take 1 tablet (40 mg total) by mouth daily., Disp: 30 tablet, Rfl: 3 .  traMADol (ULTRAM) 50 MG tablet, Take 1 tablet (50  mg total) by mouth 2 (two) times daily as needed., Disp: 60 tablet, Rfl: 3 .  vitamin C (ASCORBIC ACID) 500 MG tablet, Take 1 tablet by mouth daily., Disp: , Rfl:  .  zolpidem (AMBIEN CR) 12.5 MG CR tablet, Take 1 tablet (12.5 mg total) by mouth at bedtime., Disp: 30 tablet, Rfl: 3 .  metaxalone (SKELAXIN) 800 MG tablet, Take 1 tablet (800 mg total) by mouth 3 (three) times daily as needed for muscle spasms., Disp: 90 tablet, Rfl: 1  No Known Allergies   ROS  Constitutional: Negative for fever, positive for mild  weight change - portion control.  Respiratory: Negative for cough and shortness of breath.   Cardiovascular: Negative for chest pain or palpitations.  Gastrointestinal: Negative for abdominal pain, no bowel changes.  Musculoskeletal: Negative for gait problem or joint swelling.  Skin: Negative for rash.  Neurological: Negative for dizziness or headache.  No other specific complaints in a complete review of systems (except as listed in HPI above).  Objective  Filed Vitals:   07/02/15 0842  BP: 118/64  Pulse: 87  Temp: 98.6 F (37 C)  TempSrc: Oral  Resp: 18  Height: 5\' 5"  (1.651 m)  Weight: 178 lb 12.8 oz (81.103 kg)  SpO2: 98%    Body mass index is 29.75 kg/(m^2).  Physical Exam  Constitutional: Patient appears well-developed and well-nourished. Obese No distress.  HEENT: head atraumatic, normocephalic, pupils equal and reactive to light, ears normal TM neck supple, throat within normal limits, boggy turbinates, clear rhinorrhea Cardiovascular: Normal rate, regular rhythm and normal heart sounds. No murmur heard. No BLE edema. Mild Systolic Ejection murmur, right carotid is pulsating with a bruit on the right side only  Pulmonary/Chest: Effort normal and breath sounds normal. No respiratory distress. Abdominal: Soft. There is no tenderness. Psychiatric: Patient has a normal mood and affect. behavior is normal. Judgment and thought content normal. Muscular  skeletal: no pain during palpation of lumbar spine, negative straight leg raise  PHQ2/9: Depression screen [facility] 2/9 07/02/2015 03/05/2015 10/30/2014  Decreased Interest 0 0 0  Down, Depressed, Hopeless 0 0 1  PHQ - 2 Score 0 0 1     Fall Risk: Fall Risk  07/02/2015 03/05/2015 10/30/2014  Falls in the past year? No No No      Functional Status Survey: Is the patient deaf or have difficulty hearing?: No Does the patient have difficulty seeing, even when wearing glasses/contacts?: No Does the patient have difficulty concentrating, remembering, or making decisions?: No Does the patient have difficulty walking or climbing stairs?: No Does the patient have difficulty dressing or bathing?: No Does the patient have difficulty doing errands alone such  as visiting a doctor's office or shopping?: No    Assessment & Plan  1. Benign essential HTN  - lisinopril-hydrochlorothiazide (PRINZIDE,ZESTORETIC) 20-12.5 MG tablet; Take 1 tablet by mouth 2 (two) times daily.  Dispense: 60 tablet; Refill: 3 - amLODipine (NORVASC) 5 MG tablet; Take 1 tablet (5 mg total) by mouth daily.  Dispense: 30 tablet; Refill: 3  2. Chronic insomnia  - zolpidem (AMBIEN CR) 12.5 MG CR tablet; Take 1 tablet (12.5 mg total) by mouth at bedtime.  Dispense: 30 tablet; Refill: 3  3. Hypercholesteremia  - pravastatin (PRAVACHOL) 40 MG tablet; Take 1 tablet (40 mg total) by mouth daily.  Dispense: 30 tablet; Refill: 3  4. Vitamin D deficiency  Taking otc supplementation, last level was normal   5. Seasonal allergic rhinitis  Some rhinorrhea, but she does not want to add any medication at this time  6. Obesity (BMI 30-39.9)  Lost a few lbs since last visit, but changing portion size  7. Chronic low back pain  We will change to Skelaxin since she is getting drowsy with Flexeril  - traMADol (ULTRAM) 50 MG tablet; Take 1 tablet (50 mg total) by mouth 2 (two) times daily as needed.  Dispense: 60 tablet; Refill: 3 -  metaxalone (SKELAXIN) 800 MG tablet; Take 1 tablet (800 mg total) by mouth 3 (three) times daily as needed for muscle spasms.  Dispense: 90 tablet; Refill: 1  8. Primary osteoarthritis of both knees  Taking it prn, doing much better - traMADol (ULTRAM) 50 MG tablet; Take 1 tablet (50 mg total) by mouth 2 (two) times daily as needed.  Dispense: 60 tablet; Refill: 3  9. Right carotid bruit  - US Carotid Duplex Bilateral; Future  10. Systolic ejection murmur  She would like to hold off on Echo for now  11. Need for vaccination for Strep pneumoniae  - Pneumococcal polysaccharide vaccine 23-valent greater than or equal to 2yo subcutaneous/IM

## 2015-07-06 ENCOUNTER — Ambulatory Visit
Admission: RE | Admit: 2015-07-06 | Discharge: 2015-07-06 | Disposition: A | Discharge status: Home / Self Care | Payer: BLUE CROSS/BLUE SHIELD | Source: Ambulatory Visit | Attending: Family Medicine | Admitting: Family Medicine

## 2015-07-06 ENCOUNTER — Ambulatory Visit: Payer: BLUE CROSS/BLUE SHIELD

## 2015-07-06 DIAGNOSIS — R0989 Other specified symptoms and signs involving the circulatory and respiratory systems: Secondary | ICD-10-CM | POA: Diagnosis not present

## 2015-11-02 DIAGNOSIS — Z96659 Presence of unspecified artificial knee joint: Secondary | ICD-10-CM | POA: Diagnosis not present

## 2015-11-02 DIAGNOSIS — T8484XA Pain due to internal orthopedic prosthetic devices, implants and grafts, initial encounter: Secondary | ICD-10-CM | POA: Diagnosis not present

## 2015-11-02 DIAGNOSIS — M1612 Unilateral primary osteoarthritis, left hip: Secondary | ICD-10-CM | POA: Diagnosis not present

## 2015-11-02 DIAGNOSIS — M25562 Pain in left knee: Secondary | ICD-10-CM | POA: Diagnosis not present

## 2015-11-02 DIAGNOSIS — M25552 Pain in left hip: Secondary | ICD-10-CM | POA: Diagnosis not present

## 2015-11-05 ENCOUNTER — Encounter: Payer: Self-pay | Admitting: Family Medicine

## 2015-11-05 ENCOUNTER — Ambulatory Visit (INDEPENDENT_AMBULATORY_CARE_PROVIDER_SITE_OTHER): Payer: BLUE CROSS/BLUE SHIELD | Admitting: Family Medicine

## 2015-11-05 VITALS — BP 122/64 | HR 88 | Temp 98.0°F | Resp 16 | Ht 65.0 in | Wt 176.4 lb

## 2015-11-05 DIAGNOSIS — E559 Vitamin D deficiency, unspecified: Secondary | ICD-10-CM

## 2015-11-05 DIAGNOSIS — F5104 Psychophysiologic insomnia: Secondary | ICD-10-CM

## 2015-11-05 DIAGNOSIS — G8929 Other chronic pain: Secondary | ICD-10-CM

## 2015-11-05 DIAGNOSIS — M1612 Unilateral primary osteoarthritis, left hip: Secondary | ICD-10-CM | POA: Diagnosis not present

## 2015-11-05 DIAGNOSIS — R0602 Shortness of breath: Secondary | ICD-10-CM | POA: Diagnosis not present

## 2015-11-05 DIAGNOSIS — I10 Essential (primary) hypertension: Secondary | ICD-10-CM

## 2015-11-05 DIAGNOSIS — M419 Scoliosis, unspecified: Secondary | ICD-10-CM

## 2015-11-05 DIAGNOSIS — M545 Low back pain, unspecified: Secondary | ICD-10-CM

## 2015-11-05 DIAGNOSIS — G4700 Insomnia, unspecified: Secondary | ICD-10-CM

## 2015-11-05 DIAGNOSIS — M170 Bilateral primary osteoarthritis of knee: Secondary | ICD-10-CM

## 2015-11-05 DIAGNOSIS — E7800 Pure hypercholesterolemia, unspecified: Secondary | ICD-10-CM | POA: Diagnosis not present

## 2015-11-05 LAB — CBC WITH DIFFERENTIAL/PLATELET
BASOS ABS: 42 {cells}/uL (ref 0–200)
Basophils Relative: 1 %
EOS ABS: 126 {cells}/uL (ref 15–500)
Eosinophils Relative: 3 %
HEMATOCRIT: 36.8 % (ref 35.0–45.0)
HEMOGLOBIN: 12.1 g/dL (ref 11.7–15.5)
LYMPHS ABS: 2058 {cells}/uL (ref 850–3900)
Lymphocytes Relative: 49 %
MCH: 27 pg (ref 27.0–33.0)
MCHC: 32.9 g/dL (ref 32.0–36.0)
MCV: 82.1 fL (ref 80.0–100.0)
MONO ABS: 336 {cells}/uL (ref 200–950)
MPV: 9.8 fL (ref 7.5–12.5)
Monocytes Relative: 8 %
NEUTROS PCT: 39 %
Neutro Abs: 1638 cells/uL (ref 1500–7800)
Platelets: 292 10*3/uL (ref 140–400)
RBC: 4.48 MIL/uL (ref 3.80–5.10)
RDW: 15.2 % — ABNORMAL HIGH (ref 11.0–15.0)
WBC: 4.2 10*3/uL (ref 3.8–10.8)

## 2015-11-05 LAB — COMPREHENSIVE METABOLIC PANEL
ALT: 18 U/L (ref 6–29)
AST: 19 U/L (ref 10–35)
Albumin: 4.1 g/dL (ref 3.6–5.1)
Alkaline Phosphatase: 56 U/L (ref 33–130)
BUN: 21 mg/dL (ref 7–25)
CHLORIDE: 103 mmol/L (ref 98–110)
CO2: 27 mmol/L (ref 20–31)
Calcium: 9.5 mg/dL (ref 8.6–10.4)
Creat: 1.04 mg/dL — ABNORMAL HIGH (ref 0.50–0.99)
GLUCOSE: 89 mg/dL (ref 65–99)
POTASSIUM: 5.1 mmol/L (ref 3.5–5.3)
Sodium: 138 mmol/L (ref 135–146)
Total Bilirubin: 0.4 mg/dL (ref 0.2–1.2)
Total Protein: 7.8 g/dL (ref 6.1–8.1)

## 2015-11-05 LAB — LIPID PANEL
CHOL/HDL RATIO: 2.2 ratio (ref <=5.0)
Cholesterol: 215 mg/dL — ABNORMAL HIGH (ref 125–200)
HDL: 96 mg/dL (ref >=46)
LDL CALC: 105 mg/dL (ref <130)
TRIGLYCERIDES: 71 mg/dL (ref <150)
VLDL: 14 mg/dL (ref <30)

## 2015-11-05 MED ORDER — TRAZODONE HCL 50 MG PO TABS: 25.0000 mg | ORAL_TABLET | Freq: Every evening | ORAL | Status: DC | PRN

## 2015-11-05 MED ORDER — LISINOPRIL-HYDROCHLOROTHIAZIDE 20-12.5 MG PO TABS: 1.0000 | ORAL_TABLET | Freq: Two times a day (BID) | ORAL | Status: DC

## 2015-11-05 MED ORDER — ZOLPIDEM TARTRATE ER 6.25 MG PO TBCR
6.2500 mg | EXTENDED_RELEASE_TABLET | Freq: Every day | ORAL | Status: DC
Start: 2015-11-05 — End: 2015-11-23

## 2015-11-05 MED ORDER — AMLODIPINE BESYLATE 5 MG PO TABS: 5.0000 mg | ORAL_TABLET | Freq: Every day | ORAL | Status: DC

## 2015-11-05 MED ORDER — METAXALONE 800 MG PO TABS: 800.0000 mg | ORAL_TABLET | Freq: Three times a day (TID) | ORAL | Status: DC | PRN

## 2015-11-05 MED ORDER — TRAMADOL HCL 50 MG PO TABS: 50.0000 mg | ORAL_TABLET | Freq: Two times a day (BID) | ORAL | Status: DC | PRN

## 2015-11-05 MED ORDER — PRAVASTATIN SODIUM 40 MG PO TABS: 40.0000 mg | ORAL_TABLET | Freq: Every day | ORAL | Status: DC

## 2015-11-05 NOTE — Progress Notes (Addendum)
Name: Hayley Howe   MRN: [id number]    DOB: 03-24-1946   Date:11/05/2015       Progress Note  Subjective  Chief Complaint  Chief Complaint  Patient presents with  . Medication Refill    4 month F/U  . Hypertension    Patient states she is shortwinded with exertion   . Hyperlipidemia  . Allergic Rhinitis     Bloody mucus when blowing her nose   . Insomnia    Stable with medication-sleeps 6 hour nightly w/ Ambien. Trouble staying asleep  . Back Pain    Patient states the Skelaxin and Tramadol is helping relief some of her back pain, and doing well on Skelaxin no side effects    HPI  HTN: taking medication, denies side effects of medication, bp is at goal. No chest pain, palpitation but has intermittent SOB with activity. Discussed referral for stress test but she would like to hold off.   Hyperlipidemia: taking pravastatin and denies side effects of medication  Chronic Low Back: she has a long history of low back pain, not secondary to trauma, DDD lumbar spine, taking Tramadol daily to control symptoms. Occasionally the pain radiates to right thigh, usually is described as a aching pain and sometimes sharp. Pain at this time is 4/10.  Left hip OA: she saw Dr. Rudene Christians. She states the pain was very intense two weeks ago, causing difficulty raising left leg to get in and out of the car. Pain has improved with the rx he gave her. Nabumetone 500 mg twice daily  Insomnia/Shift Work Sleep Disorder: she sleeps for about 6 hours when she sleeps during the day and about 5 hours during the night. Works third shift. Taking Ambien high dose, discussed FDA guidelines and she is willing to go down on the dose   Patient Active Problem List   Diagnosis Date Noted  . Chronic low back pain 10/30/2014  . Benign essential HTN 10/27/2014  . Bradycardia 10/27/2014  . Insomnia 10/27/2014  . Headache, temporal 10/27/2014  . Hypercholesteremia 10/27/2014  . Obesity (BMI 30-39.9) 10/27/2014  .  Changing sleep-work schedule, affecting sleep 10/27/2014  . Vitamin D deficiency 10/27/2014  . Arthritis of knee, degenerative 09/24/2009    Past Surgical History  Procedure Laterality Date  . Breast biopsy Right 01/21/2013    stereo - benign  . Tubal ligation    . Abdominal hysterectomy    . Vaginal prolapse repair    . Knee surgery Left 2012    Dr. Rudene Christians    Family History  Problem Relation Age of Onset  . Breast cancer Sister 50    in year 2013  . Cancer Sister     Breast-2 sisters  . Hyperlipidemia Mother   . Diabetes Mother   . Cancer Father     Leukemia  . Hyperlipidemia Father   . Cancer Brother     Lung    Social History   Social History  . Marital Status: Widowed    Spouse Name: N/A  . Number of Children: N/A  . Years of Education: N/A   Occupational History  . Not on file.   Social History Main Topics  . Smoking status: Former Research scientist (life sciences)  . Smokeless tobacco: Never Used  . Alcohol Use: No  . Drug Use: No  . Sexual Activity: Not Currently   Other Topics Concern  . Not on file   Social History Narrative     Current outpatient prescriptions:  .  amLODipine (  NORVASC) 5 MG tablet, Take 1 tablet (5 mg total) by mouth daily., Disp: 30 tablet, Rfl: 3 .  aspirin 81 MG tablet, Take 1 tablet by mouth daily., Disp: , Rfl:  .  Cholecalciferol (VITAMIN D) 2000 UNITS tablet, Take 1 tablet by mouth daily., Disp: , Rfl:  .  fluticasone (FLONASE) 50 MCG/ACT nasal spray, Place 2 sprays into both nostrils daily., Disp: 16 g, Rfl: 2 .  lisinopril-hydrochlorothiazide (PRINZIDE,ZESTORETIC) 20-12.5 MG tablet, Take 1 tablet by mouth 2 (two) times daily., Disp: 60 tablet, Rfl: 3 .  loratadine (CLARITIN) 10 MG tablet, Take 1 tablet (10 mg total) by mouth daily., Disp: 30 tablet, Rfl: 5 .  Magnesium 250 MG TABS, Take 1 tablet by mouth daily., Disp: , Rfl:  .  metaxalone (SKELAXIN) 800 MG tablet, Take 1 tablet (800 mg total) by mouth 3 (three) times daily as needed for muscle  spasms., Disp: 90 tablet, Rfl: 1 .  nabumetone (RELAFEN) 500 MG tablet, Take by mouth., Disp: , Rfl:  .  pravastatin (PRAVACHOL) 40 MG tablet, Take 1 tablet (40 mg total) by mouth daily., Disp: 30 tablet, Rfl: 3 .  traMADol (ULTRAM) 50 MG tablet, Take 1 tablet (50 mg total) by mouth 2 (two) times daily as needed., Disp: 60 tablet, Rfl: 3 .  vitamin C (ASCORBIC ACID) 500 MG tablet, Take 1 tablet by mouth daily., Disp: , Rfl:  .  zolpidem (AMBIEN CR) 12.5 MG CR tablet, Take 1 tablet (12.5 mg total) by mouth at bedtime., Disp: 30 tablet, Rfl: 3  No Known Allergies   ROS  Constitutional: Negative for fever or weight change.  Respiratory: Negative for cough , positive for intermittent  shortness of breath.   Cardiovascular: Negative for chest pain or palpitations.  Gastrointestinal: Negative for abdominal pain, no bowel changes.  Musculoskeletal: Negative for gait problem or joint swelling.  Skin: Negative for rash.  Neurological: Intermittent  Dizziness ( seldom ) or headache.  No other specific complaints in a complete review of systems (except as listed in HPI above).  Objective  Filed Vitals:   11/05/15 0849  BP: 122/64  Pulse: 88  Temp: 98 F (36.7 C)  TempSrc: Oral  Resp: 16  Height: 5\' 5"  (1.651 m)  Weight: 176 lb 6.4 oz (80.015 kg)  SpO2: 98%    Body mass index is 29.35 kg/(m^2).  Physical Exam  Constitutional: Patient appears well-developed and well-nourished. Obese No distress.  HEENT: head atraumatic, normocephalic, pupils equal and reactive to light,  neck supple, throat within normal limits Cardiovascular: Normal rate, regular rhythm and normal heart sounds.  No murmur heard. No BLE edema. Pulmonary/Chest: Effort normal and breath sounds normal. No respiratory distress. Abdominal: Soft.  There is no tenderness. Psychiatric: Patient has a normal mood and affect. behavior is normal. Judgment and thought content normal. Muscular Skeletal: scoliosis, s/p left knee  replacement, pain with internal rotation of the hip  PHQ2/9: Depression screen [facility] 2/9 11/05/2015 07/02/2015 03/05/2015 10/30/2014  Decreased Interest 0 0 0 0  Down, Depressed, Hopeless 0 0 0 1  PHQ - 2 Score 0 0 0 1     Fall Risk: Fall Risk  11/05/2015 07/02/2015 03/05/2015 10/30/2014  Falls in the past year? No No No No    Functional Status Survey: Is the patient deaf or have difficulty hearing?: No Does the patient have difficulty seeing, even when wearing glasses/contacts?: No Does the patient have difficulty concentrating, remembering, or making decisions?: No Does the patient have difficulty walking or  climbing stairs?: No Does the patient have difficulty dressing or bathing?: No Does the patient have difficulty doing errands alone such as visiting a doctor's office or shopping?: No    Assessment & Plan  1. Benign essential HTN  At goal, advised to stay hydrated to avoid dizziness - amLODipine (NORVASC) 5 MG tablet; Take 1 tablet (5 mg total) by mouth daily.  Dispense: 30 tablet; Refill: 3 - lisinopril-hydrochlorothiazide (PRINZIDE,ZESTORETIC) 20-12.5 MG tablet; Take 1 tablet by mouth 2 (two) times daily.  Dispense: 60 tablet; Refill: 3 -comp panel  2. Chronic insomnia  She will try lower dose of Ambien 6.25 or Trazodone, she will try either one and let me know which medication works best - zolpidem (AMBIEN CR) 6.25 MG CR tablet; Take 1 tablet (6.25 mg total) by mouth at bedtime.  Dispense: 30 tablet; Refill: 0 - traZODone (DESYREL) 50 MG tablet; Take 0.5-1 tablets (25-50 mg total) by mouth at bedtime as needed for sleep. Either this of Ambien to see which one works best  Dispense: 30 tablet; Refill: 0  3. Hypercholesteremia  - pravastatin (PRAVACHOL) 40 MG tablet; Take 1 tablet (40 mg total) by mouth daily.  Dispense: 30 tablet; Refill: 3 -lipid panel   4. Vitamin D deficiency  Recheck labs -vitamin D  5. Primary osteoarthritis of both knees  - traMADol (ULTRAM)  50 MG tablet; Take 1 tablet (50 mg total) by mouth 2 (two) times daily as needed.  Dispense: 60 tablet; Refill: 3  6. Primary osteoarthritis of left hip  Continue follow up with Dr. Rudene Christians  7. Scoliosis   8. Chronic low back pain  - metaxalone (SKELAXIN) 800 MG tablet; Take 1 tablet (800 mg total) by mouth 3 (three) times daily as needed for muscle spasms.  Dispense: 90 tablet; Refill: 1 - traMADol (ULTRAM) 50 MG tablet; Take 1 tablet (50 mg total) by mouth 2 (two) times daily as needed.  Dispense: 60 tablet; Refill: 3   9. SOB (shortness of breath) on exertion  -CBC Discussed referral to cardiologist but she would like to hold off

## 2015-11-06 LAB — VITAMIN D 25 HYDROXY (VIT D DEFICIENCY, FRACTURES): Vit D, 25-Hydroxy: 32 ng/mL (ref 30–100)

## 2015-11-10 ENCOUNTER — Other Ambulatory Visit: Payer: Self-pay | Admitting: Family Medicine

## 2015-11-10 NOTE — Telephone Encounter (Signed)
Patient requesting refill. 

## 2015-11-23 ENCOUNTER — Other Ambulatory Visit: Payer: Self-pay | Admitting: Family Medicine

## 2015-11-23 ENCOUNTER — Telehealth: Payer: Self-pay | Admitting: Family Medicine

## 2015-11-23 DIAGNOSIS — F5104 Psychophysiologic insomnia: Secondary | ICD-10-CM

## 2015-11-23 MED ORDER — ZOLPIDEM TARTRATE ER 12.5 MG PO TBCR: 12.5000 mg | EXTENDED_RELEASE_TABLET | Freq: Every day | ORAL | 2 refills | Status: DC

## 2015-11-23 NOTE — Telephone Encounter (Signed)
Please advise 

## 2015-11-23 NOTE — Telephone Encounter (Signed)
Changed to 125mg

## 2015-11-23 NOTE — Telephone Encounter (Signed)
Gave to [place] to fax and notify patient.

## 2015-12-03 ENCOUNTER — Encounter: Payer: BLUE CROSS/BLUE SHIELD | Admitting: Family Medicine

## 2016-01-11 ENCOUNTER — Other Ambulatory Visit: Payer: Self-pay

## 2016-01-11 DIAGNOSIS — G8929 Other chronic pain: Secondary | ICD-10-CM

## 2016-01-11 DIAGNOSIS — I10 Essential (primary) hypertension: Secondary | ICD-10-CM

## 2016-01-11 DIAGNOSIS — M170 Bilateral primary osteoarthritis of knee: Secondary | ICD-10-CM

## 2016-01-11 DIAGNOSIS — M545 Low back pain: Secondary | ICD-10-CM

## 2016-01-11 MED ORDER — AMLODIPINE BESYLATE 5 MG PO TABS: 5.0000 mg | ORAL_TABLET | Freq: Every day | ORAL | 3 refills | Status: DC

## 2016-01-11 MED ORDER — TRAMADOL HCL 50 MG PO TABS: 50.0000 mg | ORAL_TABLET | Freq: Two times a day (BID) | ORAL | 3 refills | Status: DC | PRN

## 2016-01-11 MED ORDER — METAXALONE 800 MG PO TABS: 800.0000 mg | ORAL_TABLET | Freq: Three times a day (TID) | ORAL | 1 refills | Status: DC | PRN

## 2016-01-11 NOTE — Telephone Encounter (Addendum)
Patient requesting refill of Amlodipine, Metaxalone and Zena.

## 2016-01-20 ENCOUNTER — Ambulatory Visit (INDEPENDENT_AMBULATORY_CARE_PROVIDER_SITE_OTHER): Payer: BLUE CROSS/BLUE SHIELD | Admitting: Family Medicine

## 2016-01-20 ENCOUNTER — Encounter: Payer: Self-pay | Admitting: Family Medicine

## 2016-01-20 VITALS — BP 128/78 | HR 77 | Temp 97.8°F | Resp 16 | Ht 65.0 in | Wt 178.5 lb

## 2016-01-20 DIAGNOSIS — F5104 Psychophysiologic insomnia: Secondary | ICD-10-CM | POA: Diagnosis not present

## 2016-01-20 DIAGNOSIS — M5441 Lumbago with sciatica, right side: Secondary | ICD-10-CM | POA: Diagnosis not present

## 2016-01-20 DIAGNOSIS — G8929 Other chronic pain: Secondary | ICD-10-CM | POA: Diagnosis not present

## 2016-01-20 DIAGNOSIS — E669 Obesity, unspecified: Secondary | ICD-10-CM

## 2016-01-20 DIAGNOSIS — E7800 Pure hypercholesterolemia, unspecified: Secondary | ICD-10-CM

## 2016-01-20 DIAGNOSIS — M1612 Unilateral primary osteoarthritis, left hip: Secondary | ICD-10-CM | POA: Diagnosis not present

## 2016-01-20 DIAGNOSIS — Z23 Encounter for immunization: Secondary | ICD-10-CM

## 2016-01-20 DIAGNOSIS — M170 Bilateral primary osteoarthritis of knee: Secondary | ICD-10-CM

## 2016-01-20 DIAGNOSIS — I10 Essential (primary) hypertension: Secondary | ICD-10-CM | POA: Diagnosis not present

## 2016-01-20 MED ORDER — LISINOPRIL-HYDROCHLOROTHIAZIDE 20-12.5 MG PO TABS: 1.0000 | ORAL_TABLET | Freq: Two times a day (BID) | ORAL | 3 refills | Status: DC

## 2016-01-20 MED ORDER — TRAMADOL HCL 50 MG PO TABS: 50.0000 mg | ORAL_TABLET | Freq: Two times a day (BID) | ORAL | 3 refills | Status: DC | PRN

## 2016-01-20 MED ORDER — PRAVASTATIN SODIUM 40 MG PO TABS: 40.0000 mg | ORAL_TABLET | Freq: Every day | ORAL | 3 refills | Status: DC

## 2016-01-20 NOTE — Progress Notes (Signed)
Name: Hayley Howe   MRN: [id number]    DOB: [birth year]   Date:01/20/2016       Progress Note  Subjective  Chief Complaint  Chief Complaint  Patient presents with  . Medication Refill  . Hypertension  . Flu Vaccine    HPI    HTN: taking medication, denies side effects of medication, bp is at goal. No chest pain, palpitation, she denies recent SOB with activity  Hyperlipidemia: taking pravastatin and denies side effects of medication  Chronic Low Back: she has a long history of low back pain, not secondary to trauma, DDD lumbar spine, taking Tramadol once or twice daily to control symptoms. Occasionally the pain radiates to right thigh, usually is described as a aching pain and sometimes sharp. Pain at this time is 5/10.  Left hip OA: she saw Dr. Rudene Christians. She states not as severe as it was, taking Relafen prn now, she also goes to chiropractor once a month ( Dr. Rock Nephew )  Insomnia/Shift Work Sleep Disorder: she sleeps for about 6 hours when she sleeps during the day and about 5 hours during the night. Works third shift. Taking Ambien high dose, discussed FDA guidelines and we tried going down on dose but she was unable to sleep, Trazodone did not work for her  OA: she had a left knee replacement, since surgery is doing better, but occasionally has pain that radiates from left hip to left knee.   Patient Active Problem List   Diagnosis Date Noted  . Primary osteoarthritis of left hip 11/05/2015  . Scoliosis 11/05/2015  . Chronic low back pain 10/30/2014  . Benign essential HTN 10/27/2014  . Bradycardia 10/27/2014  . Insomnia 10/27/2014  . Headache, temporal 10/27/2014  . Hypercholesteremia 10/27/2014  . Obesity (BMI 30-39.9) 10/27/2014  . Changing sleep-work schedule, affecting sleep 10/27/2014  . Vitamin D deficiency 10/27/2014  . Arthritis of knee, degenerative 09/24/2009    Past Surgical History:  Procedure Laterality Date  . ABDOMINAL HYSTERECTOMY    . BREAST  BIOPSY Right 01/21/2013   stereo - benign  . KNEE SURGERY Left 2012   Dr. Rudene Christians  . TUBAL LIGATION    . VAGINAL PROLAPSE REPAIR      Family History  Problem Relation Age of Onset  . Breast cancer Sister 10    in year 2013  . Cancer Sister     Breast-2 sisters  . Hyperlipidemia Mother   . Diabetes Mother   . Cancer Father     Leukemia  . Hyperlipidemia Father   . Cancer Brother     Lung    Social History   Social History  . Marital status: Widowed    Spouse name: N/A  . Number of children: N/A  . Years of education: N/A   Occupational History  . Not on file.   Social History Main Topics  . Smoking status: Former Research scientist (life sciences)  . Smokeless tobacco: Never Used  . Alcohol use No  . Drug use: No  . Sexual activity: Not Currently   Other Topics Concern  . Not on file   Social History Narrative  . No narrative on file     Current Outpatient Prescriptions:  .  amLODipine (NORVASC) 5 MG tablet, Take 1 tablet (5 mg total) by mouth daily., Disp: 30 tablet, Rfl: 3 .  aspirin 81 MG tablet, Take 1 tablet by mouth daily., Disp: , Rfl:  .  Cholecalciferol (VITAMIN D) 2000 UNITS tablet, Take 1 tablet by mouth daily.,  Disp: , Rfl:  .  fluticasone (FLONASE) 50 MCG/ACT nasal spray, Place 2 sprays into both nostrils daily., Disp: 16 g, Rfl: 2 .  lisinopril-hydrochlorothiazide (PRINZIDE,ZESTORETIC) 20-12.5 MG tablet, Take 1 tablet by mouth 2 (two) times daily., Disp: 60 tablet, Rfl: 3 .  loratadine (CLARITIN) 10 MG tablet, Take 1 tablet (10 mg total) by mouth daily., Disp: 30 tablet, Rfl: 5 .  Magnesium 250 MG TABS, Take 1 tablet by mouth daily., Disp: , Rfl:  .  metaxalone (SKELAXIN) 800 MG tablet, Take 1 tablet (800 mg total) by mouth 3 (three) times daily as needed for muscle spasms., Disp: 90 tablet, Rfl: 1 .  nabumetone (RELAFEN) 500 MG tablet, Take by mouth., Disp: , Rfl:  .  pravastatin (PRAVACHOL) 40 MG tablet, Take 1 tablet (40 mg total) by mouth daily., Disp: 30 tablet, Rfl: 3 .   traMADol (ULTRAM) 50 MG tablet, Take 1 tablet (50 mg total) by mouth 2 (two) times daily as needed., Disp: 60 tablet, Rfl: 3 .  vitamin C (ASCORBIC ACID) 500 MG tablet, Take 1 tablet by mouth daily., Disp: , Rfl:  .  zolpidem (AMBIEN CR) 12.5 MG CR tablet, Take 1 tablet (12.5 mg total) by mouth at bedtime., Disp: 30 tablet, Rfl: 2  No Known Allergies   ROS  Constitutional: Negative for fever or weight change.  Respiratory: Negative for cough and shortness of breath.   Cardiovascular: Negative for chest pain or palpitations.  Gastrointestinal: Negative for abdominal pain, no bowel changes.  Musculoskeletal: Positive  for gait problem no joint swelling.  Skin: Negative for rash.  Neurological: Negative for dizziness or headache.  No other specific complaints in a complete review of systems (except as listed in HPI above).  Objective  Vitals:   01/20/16 0904  BP: 128/78  Pulse: 77  Resp: 16  Temp: 97.8 F (36.6 C)  SpO2: 97%  Weight: 178 lb 8 oz (81 kg)  Height: _0  (1.651 m)    Body mass index is 29.7 kg/m.  Physical Exam  Constitutional: Patient appears well-developed and well-nourished. Obese  No distress.  HEENT: head atraumatic, normocephalic, pupils equal and reactive to light, neck supple, throat within normal limits Cardiovascular: Normal rate, regular rhythm and normal heart sounds.  No murmur heard. No BLE edema. Pulmonary/Chest: Effort normal and breath sounds normal. No respiratory distress. Abdominal: Soft.  There is no tenderness. Psychiatric: Patient has a normal mood and affect. behavior is normal. Judgment and thought content normal. Muscular Skeletal: normal rom of both knees, mild pain during palpation of right lower back, negative straight leg raise, pain during internal rotation of left hip  Recent Results (from the past 2160 hour(s))  VITAMIN D 25 Hydroxy (Vit-D Deficiency, Fractures)     Status: None   Collection Time: 11/05/15 10:01 AM  Result  Value Ref Range   Vit D, 25-Hydroxy 32 30 - 100 ng/mL    Comment: Vitamin D Status           25-OH Vitamin D        Deficiency                <20 ng/mL        Insufficiency         20 - 29 ng/mL        Optimal             > or = 30 ng/mL   For 25-OH Vitamin D testing on patients on D2-supplementation and patients  for whom quantitation of D2 and D3 fractions is required, the Quest[facility]ureD 25-OH VIT D, (D2,D3), LC/MS/MS is recommended: order code [id number] (patients > 2 yrs).   Lipid panel     Status: Abnormal   Collection Time: 11/05/15 10:01 AM  Result Value Ref Range   Cholesterol 215 (H) 125 - 200 mg/dL   Triglycerides 71 <150 mg/dL   HDL 96 >=46 mg/dL   Total CHOL/HDL Ratio 2.2 <=5.0 Ratio   VLDL 14 <30 mg/dL   LDL Cholesterol 105 <130 mg/dL    Comment:   Total Cholesterol/HDL Ratio:CHD Risk                        Coronary Heart Disease Risk Table                                        Men       Women          1/2 Average Risk              3.4        3.3              Average Risk              5.0        4.4           2X Average Risk              9.6        7.1           3X Average Risk             23.4       11.0 Use the calculated Patient Ratio above and the CHD Risk table  to determine the patient's CHD Risk.   Comprehensive metabolic panel     Status: Abnormal   Collection Time: 11/05/15 10:01 AM  Result Value Ref Range   Sodium 138 135 - 146 mmol/L   Potassium 5.1 3.5 - 5.3 mmol/L   Chloride 103 98 - 110 mmol/L   CO2 27 20 - 31 mmol/L   Glucose, Bld 89 65 - 99 mg/dL   BUN 21 7 - 25 mg/dL   Creat 1.04 (H) 0.50 - 0.99 mg/dL    Comment:   For patients > or = 68 years of age: The upper reference limit for Creatinine is approximately 13% higher for people identified as African-American.      Total Bilirubin 0.4 0.2 - 1.2 mg/dL   Alkaline Phosphatase 56 33 - 130 U/L   AST 19 10 - 35 U/L   ALT 18 6 - 29 U/L   Total Protein 7.8 6.1 - 8.1 g/dL   Albumin 4.1 3.6 - 5.1  g/dL   Calcium 9.5 8.6 - 10.4 mg/dL  CBC with Differential/Platelet     Status: Abnormal   Collection Time: 11/05/15 10:01 AM  Result Value Ref Range   WBC 4.2 3.8 - 10.8 K/uL   RBC 4.48 3.80 - 5.10 MIL/uL   Hemoglobin 12.1 11.7 - 15.5 g/dL   HCT 36.8 35.0 - 45.0 %   MCV 82.1 80.0 - 100.0 fL   MCH 27.0 27.0 - 33.0 pg   MCHC 32.9 32.0 - 36.0 g/dL   RDW 15.2 (H) 11.0 - 15.0 %   Platelets 292 140 - 400  K/uL   MPV 9.8 7.5 - 12.5 fL   Neutro Abs 1,638 1,500 - 7,800 cells/uL   Lymphs Abs 2,058 850 - 3,900 cells/uL   Monocytes Absolute 336 200 - 950 cells/uL   Eosinophils Absolute 126 15 - 500 cells/uL   Basophils Absolute 42 0 - 200 cells/uL   Neutrophils Relative % 39 %   Lymphocytes Relative 49 %   Monocytes Relative 8 %   Eosinophils Relative 3 %   Basophils Relative 1 %   Smear Review Criteria for review not met     Comment: ** Please note change in unit of measure and reference range(s). **     PHQ2/9: Depression screen [facility] 2/9 01/20/2016 11/05/2015 07/02/2015 03/05/2015 10/30/2014  Decreased Interest 0 0 0 0 0  Down, Depressed, Hopeless 0 0 0 0 1  PHQ - 2 Score 0 0 0 0 1    Fall Risk: Fall Risk  01/20/2016 11/05/2015 07/02/2015 03/05/2015 10/30/2014  Falls in the past year? _0     Functional Status Survey: Is the patient deaf or have difficulty hearing?: No Does the patient have difficulty seeing, even when wearing glasses/contacts?: No Does the patient have difficulty concentrating, remembering, or making decisions?: No Does the patient have difficulty walking or climbing stairs?: No Does the patient have difficulty dressing or bathing?: No Does the patient have difficulty doing errands alone such as visiting a doctor's office or shopping?: No    Assessment & Plan  1. Chronic insomnia  Unable to go down on Ambien dose  2. Need for influenza vaccination  - Flu vaccine HIGH DOSE PF (Fluzone High dose)  3. Hypercholesteremia  - pravastatin (PRAVACHOL)  40 MG tablet; Take 1 tablet (40 mg total) by mouth daily.  Dispense: 30 tablet; Refill: 3  4. Primary osteoarthritis of both knees  - traMADol (ULTRAM) 50 MG tablet; Take 1 tablet (50 mg total) by mouth 2 (two) times daily as needed.  Dispense: 60 tablet; Refill: 3  5. Benign essential HTN  - lisinopril-hydrochlorothiazide (PRINZIDE,ZESTORETIC) 20-12.5 MG tablet; Take 1 tablet by mouth 2 (two) times daily.  Dispense: 60 tablet; Refill: 3  6. Primary osteoarthritis of left hip  Continue follow up with Dr. Rudene Christians  7. Obesity (BMI 30-39.9)  Discussed with the patient the risk posed by an increased BMI. Discussed importance of portion control, calorie counting and at least 150 minutes of physical activity weekly. Avoid sweet beverages and drink more water. Eat at least 6 servings of fruit and vegetables daily   8. Chronic right-sided low back pain with right-sided sciatica  - traMADol (ULTRAM) 50 MG tablet; Take 1 tablet (50 mg total) by mouth 2 (two) times daily as needed.  Dispense: 60 tablet; Refill: 3

## 2016-03-15 ENCOUNTER — Other Ambulatory Visit: Payer: Self-pay | Admitting: Family Medicine

## 2016-03-15 DIAGNOSIS — F5104 Psychophysiologic insomnia: Secondary | ICD-10-CM

## 2016-05-05 ENCOUNTER — Encounter: Payer: BLUE CROSS/BLUE SHIELD | Admitting: Family Medicine

## 2016-06-02 ENCOUNTER — Ambulatory Visit (INDEPENDENT_AMBULATORY_CARE_PROVIDER_SITE_OTHER): Payer: BLUE CROSS/BLUE SHIELD | Admitting: Family Medicine

## 2016-06-02 ENCOUNTER — Encounter: Payer: Self-pay | Admitting: Family Medicine

## 2016-06-02 VITALS — BP 118/68 | HR 72 | Temp 98.1°F | Resp 16 | Ht 65.0 in | Wt 174.0 lb

## 2016-06-02 DIAGNOSIS — M5441 Lumbago with sciatica, right side: Secondary | ICD-10-CM | POA: Diagnosis not present

## 2016-06-02 DIAGNOSIS — I10 Essential (primary) hypertension: Secondary | ICD-10-CM

## 2016-06-02 DIAGNOSIS — E663 Overweight: Secondary | ICD-10-CM | POA: Diagnosis not present

## 2016-06-02 DIAGNOSIS — Z129 Encounter for screening for malignant neoplasm, site unspecified: Secondary | ICD-10-CM | POA: Diagnosis not present

## 2016-06-02 DIAGNOSIS — E7800 Pure hypercholesterolemia, unspecified: Secondary | ICD-10-CM | POA: Diagnosis not present

## 2016-06-02 DIAGNOSIS — G8929 Other chronic pain: Secondary | ICD-10-CM

## 2016-06-02 DIAGNOSIS — F5104 Psychophysiologic insomnia: Secondary | ICD-10-CM

## 2016-06-02 DIAGNOSIS — M170 Bilateral primary osteoarthritis of knee: Secondary | ICD-10-CM | POA: Diagnosis not present

## 2016-06-02 MED ORDER — TRAMADOL HCL 50 MG PO TABS: 50.0000 mg | ORAL_TABLET | Freq: Two times a day (BID) | ORAL | 2 refills | Status: DC | PRN

## 2016-06-02 MED ORDER — ZOLPIDEM TARTRATE ER 12.5 MG PO TBCR: 12.5000 mg | EXTENDED_RELEASE_TABLET | Freq: Every day | ORAL | 2 refills | Status: DC

## 2016-06-02 MED ORDER — LISINOPRIL-HYDROCHLOROTHIAZIDE 20-12.5 MG PO TABS: 1.0000 | ORAL_TABLET | Freq: Every day | ORAL | 1 refills | Status: DC

## 2016-06-02 MED ORDER — PRAVASTATIN SODIUM 40 MG PO TABS: 40.0000 mg | ORAL_TABLET | Freq: Every day | ORAL | 1 refills | Status: DC

## 2016-06-02 NOTE — Progress Notes (Signed)
Name: Hayley Howe   MRN: [id number]    DOB: Sep 05, 1945   Date:06/02/2016       Progress Note  Subjective  Chief Complaint  Chief Complaint  Patient presents with  . Hypertension    4 month follow up  . Insomnia  . Hyperlipidemia  . Obesity    HPI  HTN: taking medication, denies side effects of medication, bp is towards low end of normal, we will go down on dose of lisinopril HCTZ from two pills daily to one pill daily,  no chest pain or palpitation. She denies orthostatic changes, still works full time.   Hyperlipidemia: taking pravastatin and denies myalgias, , labs reviewed HDL excellent and LDL is at goal   Chronic Low Back: she has a long history of low back pain, not secondary to trauma, DDD lumbar spine, taking Tramadol daily to control symptoms and now on Skelaxin instead of Flexeril because it is less sedating.  Occasionally the pain radiates to right thigh, usually is described as a aching pain and sometimes sharp. Pain at this time is 2/10 at this time, but she was off last night from work. Pain at the end of the day is down to 6/10. She goes to chiropractor once a month and seems to be helping.   Insomnia/Shift Work Sleep Disorder: she sleeps for about 7 hours when she sleeps during the day and about 5 hours during the night even with Ambien works third shift. Taking Ambien, denies side effects of medication , she is aware of FDA guidelines however unable to sleep with lower dose of Ambien   AR: she takes Loratadine, at this time she has rhinorrhea, she had one episode of nose bleed a couple of weeks ago but it resolved.   OA: she has aching pain on left knee, s/p knee replaced, only triggered by kneeling down, surgery was done by Dr. Rudene Christians in 2012, she was given rx of Relafen but only takes it prn    Patient Active Problem List   Diagnosis Date Noted  . Primary osteoarthritis of left hip 11/05/2015  . Scoliosis 11/05/2015  . Chronic low back pain 10/30/2014  .  Benign essential HTN 10/27/2014  . Bradycardia 10/27/2014  . Insomnia 10/27/2014  . Headache, temporal 10/27/2014  . Hypercholesteremia 10/27/2014  . Obesity (BMI 30-39.9) 10/27/2014  . Changing sleep-work schedule, affecting sleep 10/27/2014  . Vitamin D deficiency 10/27/2014  . Arthritis of knee, degenerative 09/24/2009    Past Surgical History:  Procedure Laterality Date  . ABDOMINAL HYSTERECTOMY    . BREAST BIOPSY Right 01/21/2013   stereo - benign  . KNEE SURGERY Left 2012   Dr. Rudene Christians  . TUBAL LIGATION    . VAGINAL PROLAPSE REPAIR      Family History  Problem Relation Age of Onset  . Breast cancer Sister 59    in year 2013  . Cancer Sister     Breast-2 sisters  . Hyperlipidemia Mother   . Diabetes Mother   . Cancer Father     Leukemia  . Hyperlipidemia Father   . Cancer Brother     Lung    Social History   Social History  . Marital status: Widowed    Spouse name: N/A  . Number of children: N/A  . Years of education: N/A   Occupational History  . Not on file.   Social History Main Topics  . Smoking status: Former Research scientist (life sciences)  . Smokeless tobacco: Never Used  . Alcohol use  No  . Drug use: No  . Sexual activity: Not Currently   Other Topics Concern  . Not on file   Social History Narrative  . No narrative on file     Current Outpatient Prescriptions:  .  amLODipine (NORVASC) 5 MG tablet, Take 1 tablet (5 mg total) by mouth daily., Disp: 30 tablet, Rfl: 3 .  aspirin 81 MG tablet, Take 1 tablet by mouth daily., Disp: , Rfl:  .  Cholecalciferol (VITAMIN D) 2000 UNITS tablet, Take 1 tablet by mouth daily., Disp: , Rfl:  .  fluticasone (FLONASE) 50 MCG/ACT nasal spray, Place 2 sprays into both nostrils daily., Disp: 16 g, Rfl: 2 .  lisinopril-hydrochlorothiazide (PRINZIDE,ZESTORETIC) 20-12.5 MG tablet, Take 1 tablet by mouth daily., Disp: 90 tablet, Rfl: 1 .  loratadine (CLARITIN) 10 MG tablet, Take 1 tablet (10 mg total) by mouth daily., Disp: 30 tablet,  Rfl: 5 .  Magnesium 250 MG TABS, Take 1 tablet by mouth daily., Disp: , Rfl:  .  metaxalone (SKELAXIN) 800 MG tablet, Take 1 tablet (800 mg total) by mouth 3 (three) times daily as needed for muscle spasms., Disp: 90 tablet, Rfl: 1 .  nabumetone (RELAFEN) 500 MG tablet, Take by mouth., Disp: , Rfl:  .  pravastatin (PRAVACHOL) 40 MG tablet, Take 1 tablet (40 mg total) by mouth daily., Disp: 90 tablet, Rfl: 1 .  traMADol (ULTRAM) 50 MG tablet, Take 1 tablet (50 mg total) by mouth 2 (two) times daily as needed., Disp: 60 tablet, Rfl: 2 .  vitamin C (ASCORBIC ACID) 500 MG tablet, Take 1 tablet by mouth daily., Disp: , Rfl:  .  zolpidem (AMBIEN CR) 12.5 MG CR tablet, Take 1 tablet (12.5 mg total) by mouth at bedtime., Disp: 30 tablet, Rfl: 2  No Known Allergies   ROS  Constitutional: Negative for fever or weight change.  Respiratory: Negative for cough and shortness of breath.   Cardiovascular: Negative for chest pain or palpitations.  Gastrointestinal: Negative for abdominal pain, no bowel changes.  Musculoskeletal: Negative for gait problem or joint swelling.  Skin: Negative for rash.  Neurological: Negative for dizziness or headache.  No other specific complaints in a complete review of systems (except as listed in HPI above).  Objective  Vitals:   06/02/16 0807  BP: 118/68  Pulse: 72  Resp: 16  Temp: 98.1 F (36.7 C)  SpO2: 91%  Weight: 174 lb (78.9 kg)  Height: 5\' 5"  (1.651 m)    Body mass index is 28.96 kg/m.  Physical Exam  Constitutional: Patient appears well-developed and well-nourished. Obese No distress.  HEENT: head atraumatic, normocephalic, pupils equal and reactive to light, neck supple, throat within normal limits Cardiovascular: Normal rate, regular rhythm and normal heart sounds.  No murmur heard. No BLE edema. Pulmonary/Chest: Effort normal and breath sounds normal. No respiratory distress. Abdominal: Soft.  There is no tenderness. Psychiatric: Patient has  a normal mood and affect. behavior is normal. Judgment and thought content normal.  PHQ2/9: Depression screen [facility] 2/9 01/20/2016 11/05/2015 07/02/2015 03/05/2015 10/30/2014  Decreased Interest 0 0 0 0 0  Down, Depressed, Hopeless 0 0 0 0 1  PHQ - 2 Score 0 0 0 0 1     Fall Risk: Fall Risk  01/20/2016 11/05/2015 07/02/2015 03/05/2015 10/30/2014  Falls in the past year? No No No No No      Assessment & Plan  1. Chronic insomnia  - zolpidem (AMBIEN CR) 12.5 MG CR tablet; Take 1 tablet (12.5  mg total) by mouth at bedtime.  Dispense: 30 tablet; Refill: 2  2. Hypercholesteremia  - pravastatin (PRAVACHOL) 40 MG tablet; Take 1 tablet (40 mg total) by mouth daily.  Dispense: 90 tablet; Refill: 1  3. Benign essential HTN  - lisinopril-hydrochlorothiazide (PRINZIDE,ZESTORETIC) 20-12.5 MG tablet; Take 1 tablet by mouth daily.  Dispense: 90 tablet; Refill: 1  4. Primary osteoarthritis of right hip  Stable, taking relafen prn   5. Overweight (BMI 25.0-29.9)  Doing well, lost some weigh since last visit  6. Primary osteoarthritis of both knees  - traMADol (ULTRAM) 50 MG tablet; Take 1 tablet (50 mg total) by mouth 2 (two) times daily as needed.  Dispense: 60 tablet; Refill: 2  7. Chronic right-sided low back pain with right-sided sciatica  - traMADol (ULTRAM) 50 MG tablet; Take 1 tablet (50 mg total) by mouth 2 (two) times daily as needed.  Dispense: 60 tablet; Refill: 2  8. Encounter for cancer screening  - Ambulatory referral to Gastroenterology

## 2016-06-07 ENCOUNTER — Telehealth: Payer: Self-pay

## 2016-06-07 ENCOUNTER — Other Ambulatory Visit: Payer: Self-pay

## 2016-06-07 NOTE — Telephone Encounter (Signed)
Gastroenterology Pre-Procedure Review  Request Date:  Requesting Physician: Dr.   PATIENT REVIEW QUESTIONS: The patient responded to the following health history questions as indicated:    1. Are you having any GI issues? no 2. Do you have a personal history of Polyps? yes (yes) 3. Do you have a family history of Colon Cancer or Polyps? yes (sister polyps) 4. Diabetes Mellitus? no 5. Joint replacements in the past 12 months?no 6. Major health problems in the past 3 months?no 7. Any artificial heart valves, MVP, or defibrillator?no    MEDICATIONS & ALLERGIES:    Patient reports the following regarding taking any anticoagulation/antiplatelet therapy:   Plavix, Coumadin, Eliquis, Xarelto, Lovenox, Pradaxa, Brilinta, or Effient? no Aspirin? no  Patient confirms/reports the following medications:  Current Outpatient Prescriptions  Medication Sig Dispense Refill  . amLODipine (NORVASC) 5 MG tablet Take 1 tablet (5 mg total) by mouth daily. 30 tablet 3  . aspirin 81 MG tablet Take 1 tablet by mouth daily.    . Cholecalciferol (VITAMIN D) 2000 UNITS tablet Take 1 tablet by mouth daily.    . fluticasone (FLONASE) 50 MCG/ACT nasal spray Place 2 sprays into both nostrils daily. 16 g 2  . lisinopril-hydrochlorothiazide (PRINZIDE,ZESTORETIC) 20-12.5 MG tablet Take 1 tablet by mouth daily. 90 tablet 1  . loratadine (CLARITIN) 10 MG tablet Take 1 tablet (10 mg total) by mouth daily. 30 tablet 5  . Magnesium 250 MG TABS Take 1 tablet by mouth daily.    . metaxalone (SKELAXIN) 800 MG tablet Take 1 tablet (800 mg total) by mouth 3 (three) times daily as needed for muscle spasms. 90 tablet 1  . nabumetone (RELAFEN) 500 MG tablet Take by mouth.    . pravastatin (PRAVACHOL) 40 MG tablet Take 1 tablet (40 mg total) by mouth daily. 90 tablet 1  . traMADol (ULTRAM) 50 MG tablet Take 1 tablet (50 mg total) by mouth 2 (two) times daily as needed. 60 tablet 2  . vitamin C (ASCORBIC ACID) 500 MG tablet Take 1  tablet by mouth daily.    Marland Kitchen zolpidem (AMBIEN CR) 12.5 MG CR tablet Take 1 tablet (12.5 mg total) by mouth at bedtime. 30 tablet 2   No current facility-administered medications for this visit.     Patient confirms/reports the following allergies:  No Known Allergies  No orders of the defined types were placed in this encounter.   AUTHORIZATION INFORMATION Primary Insurance: 1D#: Group #:  Secondary Insurance: 1D#: Group #:  SCHEDULE INFORMATION: Date: 06/30/16 Time: Location: [place]

## 2016-06-10 ENCOUNTER — Telehealth: Payer: Self-pay | Admitting: Gastroenterology

## 2016-06-10 NOTE — Telephone Encounter (Signed)
Patient will be having a colonoscopy and was wondering if she will need to be on a antibiotic for this procedure due to a knee replacement that she had. She takes one for dental work but really hasn't had any other procedures. She would like for you to call her and let her know. She works third shift so you may leave a message.

## 2016-06-16 ENCOUNTER — Telehealth: Payer: Self-pay | Admitting: Gastroenterology

## 2016-06-16 NOTE — Telephone Encounter (Signed)
Patient called and stated that she doesn't have to be on an antibiotic for her procedure only when she goes to the dentist. Please call her if you have any questions.

## 2016-06-30 ENCOUNTER — Ambulatory Visit: Payer: BLUE CROSS/BLUE SHIELD | Admitting: Certified Registered"

## 2016-06-30 ENCOUNTER — Encounter
Admission: RE | Disposition: A | Discharge status: Home / Self Care | Payer: Self-pay | Source: Ambulatory Visit | Attending: Gastroenterology

## 2016-06-30 ENCOUNTER — Ambulatory Visit
Admission: RE | Admit: 2016-06-30 | Discharge: 2016-06-30 | Disposition: A | Discharge status: Home / Self Care | Payer: BLUE CROSS/BLUE SHIELD | Source: Ambulatory Visit | Attending: Gastroenterology | Admitting: Gastroenterology

## 2016-06-30 ENCOUNTER — Encounter: Payer: Self-pay | Admitting: *Deleted

## 2016-06-30 DIAGNOSIS — G4700 Insomnia, unspecified: Secondary | ICD-10-CM | POA: Insufficient documentation

## 2016-06-30 DIAGNOSIS — E785 Hyperlipidemia, unspecified: Secondary | ICD-10-CM | POA: Insufficient documentation

## 2016-06-30 DIAGNOSIS — M1990 Unspecified osteoarthritis, unspecified site: Secondary | ICD-10-CM | POA: Insufficient documentation

## 2016-06-30 DIAGNOSIS — D122 Benign neoplasm of ascending colon: Secondary | ICD-10-CM | POA: Insufficient documentation

## 2016-06-30 DIAGNOSIS — Z86010 Personal history of colonic polyps: Secondary | ICD-10-CM | POA: Insufficient documentation

## 2016-06-30 DIAGNOSIS — Z7982 Long term (current) use of aspirin: Secondary | ICD-10-CM | POA: Insufficient documentation

## 2016-06-30 DIAGNOSIS — Z955 Presence of coronary angioplasty implant and graft: Secondary | ICD-10-CM | POA: Diagnosis not present

## 2016-06-30 DIAGNOSIS — K6389 Other specified diseases of intestine: Secondary | ICD-10-CM | POA: Diagnosis not present

## 2016-06-30 DIAGNOSIS — Z1211 Encounter for screening for malignant neoplasm of colon: Secondary | ICD-10-CM | POA: Insufficient documentation

## 2016-06-30 DIAGNOSIS — I10 Essential (primary) hypertension: Secondary | ICD-10-CM | POA: Diagnosis not present

## 2016-06-30 DIAGNOSIS — I252 Old myocardial infarction: Secondary | ICD-10-CM | POA: Diagnosis not present

## 2016-06-30 DIAGNOSIS — K640 First degree hemorrhoids: Secondary | ICD-10-CM | POA: Insufficient documentation

## 2016-06-30 DIAGNOSIS — Z79899 Other long term (current) drug therapy: Secondary | ICD-10-CM | POA: Diagnosis not present

## 2016-06-30 DIAGNOSIS — Z87891 Personal history of nicotine dependence: Secondary | ICD-10-CM | POA: Diagnosis not present

## 2016-06-30 DIAGNOSIS — K5730 Diverticulosis of large intestine without perforation or abscess without bleeding: Secondary | ICD-10-CM | POA: Insufficient documentation

## 2016-06-30 HISTORY — PX: COLONOSCOPY WITH PROPOFOL: SHX5780

## 2016-06-30 SURGERY — COLONOSCOPY WITH PROPOFOL
Anesthesia: General

## 2016-06-30 MED ORDER — PROPOFOL 500 MG/50ML IV EMUL
INTRAVENOUS | Status: DC | PRN
  Administered 2016-06-30: 150 ug/kg/min via INTRAVENOUS

## 2016-06-30 MED ORDER — PROPOFOL 10 MG/ML IV BOLUS
INTRAVENOUS | Status: DC | PRN
  Administered 2016-06-30: 20 mg via INTRAVENOUS
  Administered 2016-06-30: 50 mg via INTRAVENOUS

## 2016-06-30 MED ORDER — SODIUM CHLORIDE 0.9 % IV SOLN
INTRAVENOUS | Status: DC
  Administered 2016-06-30: 11:00:00 via INTRAVENOUS

## 2016-06-30 MED ORDER — PROPOFOL 500 MG/50ML IV EMUL
INTRAVENOUS | Status: AC
  Filled 2016-06-30: qty 50

## 2016-06-30 NOTE — Anesthesia Procedure Notes (Signed)
Performed by: Danaysha Kirn Pre-anesthesia Checklist: Patient identified, Emergency Drugs available, Suction available, Patient being monitored and Timeout performed Patient Re-evaluated:Patient Re-evaluated prior to inductionOxygen Delivery Method: Nasal cannula Preoxygenation: Pre-oxygenation with 100% oxygen Intubation Type: IV induction       

## 2016-06-30 NOTE — Anesthesia Post-op Follow-up Note (Cosign Needed)
Anesthesia QCDR form completed.        

## 2016-06-30 NOTE — Op Note (Signed)
[facility] Gastroenterology Patient Name: Hayley Howe Procedure Date: 06/30/2016 11:48 AM MRN: [id number] Account #: [id number] Date of Birth: September 30, 1945 Admit Type: Outpatient Age: 71 Room: [facility] ENDO ROOM 4 Gender: Female Note Status: Finalized Procedure:            Colonoscopy Indications:          High risk colon cancer surveillance: Personal history                        of colonic polyps, Last colonoscopy: June 2008 Providers:            Jonathon Bellows MD, MD Referring MD:         Bethena Roys. Sowles, MD (Referring MD) Medicines:            Monitored Anesthesia Care Complications:        No immediate complications. Procedure:            Pre-Anesthesia Assessment:                       - Prior to the procedure, a History and Physical was                        performed, and patient medications, allergies and                        sensitivities were reviewed. The patient's tolerance of                        previous anesthesia was reviewed.                       - The risks and benefits of the procedure and the                        sedation options and risks were discussed with the                        patient. All questions were answered and informed                        consent was obtained.                       - ASA Grade Assessment: II - A patient with mild                        systemic disease.                       After obtaining informed consent, the colonoscope was                        passed under direct vision. Throughout the procedure,                        the patient's blood pressure, pulse, and oxygen                        saturations were monitored continuously. The Olympus  CF-HQ190L Colonoscope (S#. [id number]) was introduced                        through the anus and advanced to the the cecum,                        identified by the appendiceal orifice, IC valve and   transillumination. The colonoscopy was performed with                        ease. The patient tolerated the procedure well. The                        quality of the bowel preparation was good. Findings:      The perianal and digital rectal examinations were normal.      A 5 mm polyp was found in the ascending colon. The polyp was sessile.       The polyp was removed with a cold biopsy forceps. Resection and       retrieval were complete.      Multiple medium-mouthed diverticula were found in the sigmoid colon.      Non-bleeding internal hemorrhoids were found during retroflexion. The       hemorrhoids were small and Grade I (internal hemorrhoids that do not       prolapse).      The exam was otherwise without abnormality. Impression:           - One 5 mm polyp in the ascending colon, removed with a                        cold biopsy forceps. Resected and retrieved.                       - Diverticulosis in the sigmoid colon.                       - Non-bleeding internal hemorrhoids.                       - The examination was otherwise normal. Recommendation:       - Discharge patient to home (with escort).                       - Resume previous diet.                       - Continue present medications.                       - Await pathology results.                       - Repeat colonoscopy in 5 years for surveillance. Procedure Code(s):    --- Professional ---                       [phone], Colonoscopy, flexible; with biopsy, single or                        multiple Diagnosis Code(s):    --- Professional ---  Z86.010, Personal history of colonic polyps                       D12.2, Benign neoplasm of ascending colon                       K64.0, First degree hemorrhoids                       K57.30, Diverticulosis of large intestine without                        perforation or abscess without bleeding CPT copyright 2016 American Medical Association. All  rights reserved. The codes documented in this report are preliminary and upon coder review may  be revised to meet current compliance requirements. Jonathon Bellows, MD Jonathon Bellows MD, MD 06/30/2016 12:24:16 PM This report has been signed electronically. Number of Addenda: 0 Note Initiated On: 06/30/2016 11:48 AM Scope Withdrawal Time: 0 hours 15 minutes 0 seconds  Total Procedure Duration: 0 hours 20 minutes 51 seconds       [facility]

## 2016-06-30 NOTE — H&P (Signed)
Jonathon Bellows MD [address] [place], [place] [address] Phone: [phone] Fax : [phone]  Primary Care Physician:  Loistine Chance, MD Primary Gastroenterologist:  Dr. Jonathon Bellows   Pre-Procedure History & Physical: HPI:  Hayley Howe is a 71 y.o. female is here for an colonoscopy.   Past Medical History:  Diagnosis Date  . Hyperlipidemia   . Hypertension   . Insomnia   . Low back pain   . Obesity   . Osteoarthritis of left knee     Past Surgical History:  Procedure Laterality Date  . ABDOMINAL HYSTERECTOMY    . BREAST BIOPSY Right 01/21/2013   stereo - benign  . KNEE SURGERY Left 2012   Dr. Rudene Christians  . TUBAL LIGATION    . VAGINAL PROLAPSE REPAIR      Prior to Admission medications   Medication Sig Start Date End Date Taking? Authorizing Provider  amLODipine (NORVASC) 5 MG tablet Take 1 tablet (5 mg total) by mouth daily. 01/11/16  Yes Steele Sizer, MD  aspirin 81 MG tablet Take 1 tablet by mouth daily. 10/08/10  Yes Historical Provider, MD  Cholecalciferol (VITAMIN D) 2000 UNITS tablet Take 1 tablet by mouth daily. 02/11/11  Yes Historical Provider, MD  lisinopril-hydrochlorothiazide (PRINZIDE,ZESTORETIC) 20-12.5 MG tablet Take 1 tablet by mouth daily. 06/02/16  Yes Steele Sizer, MD  metaxalone (SKELAXIN) 800 MG tablet Take 1 tablet (800 mg total) by mouth 3 (three) times daily as needed for muscle spasms. 01/11/16  Yes Steele Sizer, MD  nabumetone (RELAFEN) 500 MG tablet Take by mouth. 11/02/15 11/01/16 Yes Hessie Knows, MD  pravastatin (PRAVACHOL) 40 MG tablet Take 1 tablet (40 mg total) by mouth daily. 06/02/16  Yes Steele Sizer, MD  traMADol (ULTRAM) 50 MG tablet Take 1 tablet (50 mg total) by mouth 2 (two) times daily as needed. 06/02/16  Yes Steele Sizer, MD  zolpidem (AMBIEN CR) 12.5 MG CR tablet Take 1 tablet (12.5 mg total) by mouth at bedtime. 06/02/16  Yes Steele Sizer, MD  fluticasone (FLONASE) 50 MCG/ACT nasal spray Place 2 sprays into both  nostrils daily. Patient not taking: Reported on 06/30/2016 03/05/15   Steele Sizer, MD  loratadine (CLARITIN) 10 MG tablet Take 1 tablet (10 mg total) by mouth daily. Patient not taking: Reported on 06/30/2016 03/05/15   Steele Sizer, MD  Magnesium 250 MG TABS Take 1 tablet by mouth daily.    Historical Provider, MD  vitamin C (ASCORBIC ACID) 500 MG tablet Take 1 tablet by mouth daily.    Historical Provider, MD    Allergies as of 06/07/2016  . (No Known Allergies)    Family History  Problem Relation Age of Onset  . Breast cancer Sister 56    in year 2013  . Cancer Sister     Breast-2 sisters  . Hyperlipidemia Mother   . Diabetes Mother   . Cancer Father     Leukemia  . Hyperlipidemia Father   . Cancer Brother     Lung    Social History   Social History  . Marital status: Widowed    Spouse name: N/A  . Number of children: N/A  . Years of education: N/A   Occupational History  . Not on file.   Social History Main Topics  . Smoking status: Former Research scientist (life sciences)  . Smokeless tobacco: Never Used  . Alcohol use No  . Drug use: No  . Sexual activity: Not Currently   Other Topics Concern  . Not on  file   Social History Narrative  . No narrative on file    Review of Systems: See HPI, otherwise negative ROS  Physical Exam: BP (!) 150/91   Pulse 85   Temp 97.7 F (36.5 C) (Tympanic)   Resp 18   Ht 5\' 4"  (1.626 m)   Wt 174 lb (78.9 kg)   SpO2 100%   BMI 29.87 kg/m  General:   Alert,  pleasant and cooperative in NAD Head:  Normocephalic and atraumatic. Neck:  Supple; no masses or thyromegaly. Lungs:  Clear throughout to auscultation.    Heart:  Regular rate and rhythm. Abdomen:  Soft, nontender and nondistended. Normal bowel sounds, without guarding, and without rebound.   Neurologic:  Alert and  oriented x4;  grossly normal neurologically.  Impression/Plan: Hayley Howe is here for an colonoscopy to be performed for surveillance due to personal history  of colon polyps   Risks, benefits, limitations, and alternatives regarding  colonoscopy have been reviewed with the patient.  Questions have been answered.  All parties agreeable.   Jonathon Bellows, MD  06/30/2016, 11:53 AM

## 2016-06-30 NOTE — Anesthesia Postprocedure Evaluation (Signed)
Anesthesia Post Note  Patient: Hayley Howe  Procedure(s) Performed: Procedure(s) (LRB): COLONOSCOPY WITH PROPOFOL (N/A)  Patient location during evaluation: Endoscopy Anesthesia Type: General Level of consciousness: awake and alert Pain management: pain level controlled Vital Signs Assessment: post-procedure vital signs reviewed and stable Respiratory status: spontaneous breathing, nonlabored ventilation, respiratory function stable and patient connected to nasal cannula oxygen Cardiovascular status: blood pressure returned to baseline and stable Postop Assessment: no signs of nausea or vomiting Anesthetic complications: no     Last Vitals:  Vitals:   06/30/16 1245 06/30/16 1255  BP: 133/78 132/77  Pulse: 60 60  Resp: 18 19  Temp:      Last Pain:  Vitals:   06/30/16 1225  TempSrc: Tympanic  PainSc: Asleep                 Martha Clan

## 2016-06-30 NOTE — Transfer of Care (Signed)
Immediate Anesthesia Transfer of Care Note  Patient: Hayley Howe  Procedure(s) Performed: Procedure(s): COLONOSCOPY WITH PROPOFOL (N/A)  Patient Location: PACU  Anesthesia Type:General  Level of Consciousness: sedated and responds to stimulation  Airway & Oxygen Therapy: Patient Spontanous Breathing and Patient connected to nasal cannula oxygen  Post-op Assessment: Report given to RN and Post -op Vital signs reviewed and stable  Post vital signs: Reviewed and stable  Last Vitals:  Vitals:   06/30/16 1225 06/30/16 1226  BP: (P) 119/70 119/70  Pulse:  74  Resp:  (!) 22  Temp: (!) (P) 35.9 C     Last Pain:  Vitals:   06/30/16 1225  TempSrc: (P) Tympanic         Complications: No apparent anesthesia complications

## 2016-06-30 NOTE — Anesthesia Preprocedure Evaluation (Signed)
Anesthesia Evaluation  Patient identified by MRN, date of birth, ID band Patient awake    Reviewed: Allergy & Precautions, H&P , NPO status , Patient's Chart, lab work & pertinent test results, reviewed documented beta blocker date and time   History of Anesthesia Complications Negative for: history of anesthetic complications  Airway Mallampati: I  TM Distance: >3 FB Neck ROM: full    Dental  (+) Missing   Pulmonary neg pulmonary ROS, former smoker,           Cardiovascular Exercise Tolerance: Good hypertension, On Medications (-) angina(-) CAD, (-) Past MI, (-) Cardiac Stents and (-) CABG (-) dysrhythmias (-) Valvular Problems/Murmurs     Neuro/Psych negative neurological ROS  negative psych ROS   GI/Hepatic negative GI ROS, Neg liver ROS,   Endo/Other  negative endocrine ROS  Renal/GU negative Renal ROS  negative genitourinary   Musculoskeletal   Abdominal   Peds  Hematology negative hematology ROS (+)   Anesthesia Other Findings Past Medical History: No date: Hyperlipidemia No date: Hypertension No date: Insomnia No date: Low back pain No date: Obesity No date: Osteoarthritis of left knee   Reproductive/Obstetrics negative OB ROS                             Anesthesia Physical Anesthesia Plan  ASA: II  Anesthesia Plan: General   Post-op Pain Management:    Induction:   Airway Management Planned:   Additional Equipment:   Intra-op Plan:   Post-operative Plan:   Informed Consent: I have reviewed the patients History and Physical, chart, labs and discussed the procedure including the risks, benefits and alternatives for the proposed anesthesia with the patient or authorized representative who has indicated his/her understanding and acceptance.   Dental Advisory Given  Plan Discussed with: Anesthesiologist, CRNA and Surgeon  Anesthesia Plan Comments:          Anesthesia Quick Evaluation

## 2016-07-01 ENCOUNTER — Encounter: Payer: Self-pay | Admitting: Gastroenterology

## 2016-07-01 LAB — SURGICAL PATHOLOGY

## 2016-08-12 ENCOUNTER — Encounter: Payer: Self-pay | Admitting: Family Medicine

## 2016-08-12 ENCOUNTER — Ambulatory Visit (INDEPENDENT_AMBULATORY_CARE_PROVIDER_SITE_OTHER): Payer: BLUE CROSS/BLUE SHIELD | Admitting: Family Medicine

## 2016-08-12 VITALS — BP 136/64 | HR 67 | Temp 98.5°F | Resp 16 | Ht 63.25 in | Wt 174.8 lb

## 2016-08-12 DIAGNOSIS — K5909 Other constipation: Secondary | ICD-10-CM

## 2016-08-12 DIAGNOSIS — Z131 Encounter for screening for diabetes mellitus: Secondary | ICD-10-CM | POA: Diagnosis not present

## 2016-08-12 DIAGNOSIS — I10 Essential (primary) hypertension: Secondary | ICD-10-CM | POA: Diagnosis not present

## 2016-08-12 DIAGNOSIS — Z01419 Encounter for gynecological examination (general) (routine) without abnormal findings: Secondary | ICD-10-CM | POA: Diagnosis not present

## 2016-08-12 DIAGNOSIS — M5441 Lumbago with sciatica, right side: Secondary | ICD-10-CM | POA: Diagnosis not present

## 2016-08-12 DIAGNOSIS — E559 Vitamin D deficiency, unspecified: Secondary | ICD-10-CM | POA: Diagnosis not present

## 2016-08-12 DIAGNOSIS — Z1231 Encounter for screening mammogram for malignant neoplasm of breast: Secondary | ICD-10-CM | POA: Diagnosis not present

## 2016-08-12 DIAGNOSIS — Z23 Encounter for immunization: Secondary | ICD-10-CM

## 2016-08-12 DIAGNOSIS — G8929 Other chronic pain: Secondary | ICD-10-CM

## 2016-08-12 DIAGNOSIS — Z1239 Encounter for other screening for malignant neoplasm of breast: Secondary | ICD-10-CM

## 2016-08-12 DIAGNOSIS — M170 Bilateral primary osteoarthritis of knee: Secondary | ICD-10-CM | POA: Diagnosis not present

## 2016-08-12 DIAGNOSIS — G4726 Circadian rhythm sleep disorder, shift work type: Secondary | ICD-10-CM

## 2016-08-12 DIAGNOSIS — F5104 Psychophysiologic insomnia: Secondary | ICD-10-CM

## 2016-08-12 DIAGNOSIS — E7800 Pure hypercholesterolemia, unspecified: Secondary | ICD-10-CM | POA: Diagnosis not present

## 2016-08-12 LAB — LIPID PANEL
Cholesterol: 186 mg/dL (ref <200)
HDL: 90 mg/dL (ref >50)
LDL Cholesterol: 86 mg/dL (ref <100)
TRIGLYCERIDES: 50 mg/dL (ref <150)
Total CHOL/HDL Ratio: 2.1 Ratio (ref <5.0)
VLDL: 10 mg/dL (ref <30)

## 2016-08-12 LAB — COMPLETE METABOLIC PANEL WITH GFR
AG Ratio: 1.3 Ratio (ref 1.0–2.5)
ALBUMIN: 4.2 g/dL (ref 3.6–5.1)
ALK PHOS: 54 U/L (ref 33–130)
ALT: 17 U/L (ref 6–29)
AST: 22 U/L (ref 10–35)
BILIRUBIN TOTAL: 0.3 mg/dL (ref 0.2–1.2)
BUN / CREAT RATIO: 19.8 ratio (ref 6–22)
BUN: 18 mg/dL (ref 7–25)
CALCIUM: 9.3 mg/dL (ref 8.6–10.4)
CO2: 28 mmol/L (ref 20–31)
CREATININE: 0.91 mg/dL (ref 0.50–0.99)
Chloride: 102 mmol/L (ref 98–110)
GFR, Est African American: 74 mL/min (ref >=60)
GFR, Est Non African American: 65 mL/min (ref >=60)
GLUCOSE: 93 mg/dL (ref 65–99)
Globulin: 3.2 g/dL (ref 1.9–3.7)
POTASSIUM: 4.6 mmol/L (ref 3.5–5.3)
Sodium: 139 mmol/L (ref 135–146)
TOTAL PROTEIN: 7.4 g/dL (ref 6.1–8.1)

## 2016-08-12 LAB — CBC WITH DIFFERENTIAL/PLATELET
Basophils Absolute: 43 cells/uL (ref 0–200)
Basophils Relative: 1 %
EOS PCT: 2 %
Eosinophils Absolute: 86 cells/uL (ref 15–500)
HCT: 36.8 % (ref 35.0–45.0)
Hemoglobin: 11.8 g/dL (ref 11.7–15.5)
LYMPHS PCT: 49 %
Lymphs Abs: 2107 cells/uL (ref 850–3900)
MCH: 26.8 pg — ABNORMAL LOW (ref 27.0–33.0)
MCHC: 32.1 g/dL (ref 32.0–36.0)
MCV: 83.4 fL (ref 80.0–100.0)
MPV: 9.8 fL (ref 7.5–12.5)
Monocytes Absolute: 344 cells/uL (ref 200–950)
Monocytes Relative: 8 %
NEUTROS PCT: 40 %
Neutro Abs: 1720 cells/uL (ref 1500–7800)
Platelets: 329 10*3/uL (ref 140–400)
RBC: 4.41 MIL/uL (ref 3.80–5.10)
RDW: 15.5 % — AB (ref 11.0–15.0)
WBC: 4.3 10*3/uL (ref 3.8–10.8)

## 2016-08-12 LAB — VITAMIN B12: Vitamin B-12: 408 pg/mL (ref 200–1100)

## 2016-08-12 MED ORDER — LUBIPROSTONE 24 MCG PO CAPS: 24.0000 ug | ORAL_CAPSULE | Freq: Two times a day (BID) | ORAL | 5 refills | Status: DC

## 2016-08-12 MED ORDER — TRAMADOL HCL 50 MG PO TABS: 50.0000 mg | ORAL_TABLET | Freq: Two times a day (BID) | ORAL | 2 refills | Status: DC | PRN

## 2016-08-12 MED ORDER — ZOLPIDEM TARTRATE ER 12.5 MG PO TBCR: 12.5000 mg | EXTENDED_RELEASE_TABLET | Freq: Every day | ORAL | 2 refills | Status: DC

## 2016-08-12 NOTE — Progress Notes (Signed)
Name: Hayley Howe   MRN: [id number]    DOB: 14-Oct-1945   Date:08/12/2016       Progress Note  Subjective  Chief Complaint  Chief Complaint  Patient presents with  . Annual Exam    HPI  Well Woman: she denies bladder problems such as urgency or frequency, no vaginal discharge, no change is bowel movements ( always constipation ). Up to date with bone density and colonoscopy. Due for labs, needs Td and also mammogram.   Chronic constipation: she has always been constipated, bowel movements only with laxatives and about 3 times per week, colonoscopy showed melanosis coli. Discussed options and she is willing to try Amitiza  HTN: taking medication, denies side effects of medication bp is at goal with lower dose of lisinopril HCTZ from two pills daily to one pill daily,  no chest pain or palpitation. She denies orthostatic changes, still works full time.   Hyperlipidemia: taking pravastatin and denies myalgias, we will recheck labs today   Chronic Low Back: she has a long history of low back pain, not secondary to trauma, DDD lumbar spine, taking Tramadol daily to control symptoms and now on Skelaxin instead of Flexeril because it is less sedating. Occasionally the pain radiates to right thigh, usually is described as a aching pain and sometimes sharp. Pain at this time is 4/10 at this tim.  Pain at the end of the day is down to 6/10. She goes to chiropractor once a month and seems to be helping.   Insomnia/Shift Work Sleep Disorder: she sleeps for about 6-7 hours when she sleeps during the day and about 5 hours during the night. Taking Ambien, denies side effects of medication , she is aware of FDA guidelines however unable to sleep with lower dose of Ambien   OA: she has aching pain on left knee, s/p knee replaced, only triggered by kneeling down, surgery was done by Dr. Rudene Christians in 2012, she was given rx of Relafen but only takes it prn   Patient Active Problem List   Diagnosis Date  Noted  . Primary osteoarthritis of left hip 11/05/2015  . Scoliosis 11/05/2015  . Chronic low back pain 10/30/2014  . Benign essential HTN 10/27/2014  . Bradycardia 10/27/2014  . Insomnia 10/27/2014  . Headache, temporal 10/27/2014  . Hypercholesteremia 10/27/2014  . Obesity (BMI 30-39.9) 10/27/2014  . Changing sleep-work schedule, affecting sleep 10/27/2014  . Vitamin D deficiency 10/27/2014  . Arthritis of knee, degenerative 09/24/2009    Past Surgical History:  Procedure Laterality Date  . ABDOMINAL HYSTERECTOMY    . BREAST BIOPSY Right 01/21/2013   stereo - benign  . COLONOSCOPY WITH PROPOFOL N/A 06/30/2016   Procedure: COLONOSCOPY WITH PROPOFOL;  Surgeon: Jonathon Bellows, MD;  Location: [facility] ENDOSCOPY;  Service: Endoscopy;  Laterality: N/A;  . KNEE SURGERY Left 2012   Dr. Rudene Christians  . TUBAL LIGATION    . VAGINAL PROLAPSE REPAIR      Family History  Problem Relation Age of Onset  . Breast cancer Sister 74    in year 2013  . Cancer Sister     Breast-2 sisters  . Hyperlipidemia Mother   . Diabetes Mother   . Cancer Father     Leukemia  . Hyperlipidemia Father   . Cancer Brother     Lung    Social History   Social History  . Marital status: Widowed    Spouse name: N/A  . Number of children: N/A  . Years of  education: N/A   Occupational History  . Not on file.   Social History Main Topics  . Smoking status: Former Smoker    Years: 1.00    Types: Cigarettes    Start date: 08/13/1967    Quit date: 04/18/1977  . Smokeless tobacco: Never Used     Comment: Only smoked for a year  . Alcohol use No  . Drug use: No  . Sexual activity: Not Currently   Other Topics Concern  . Not on file   Social History Narrative  . No narrative on file     Current Outpatient Prescriptions:  .  amLODipine (NORVASC) 5 MG tablet, Take 1 tablet (5 mg total) by mouth daily., Disp: 30 tablet, Rfl: 3 .  aspirin 81 MG tablet, Take 1 tablet by mouth daily., Disp: , Rfl:  .   Cholecalciferol (VITAMIN D) 2000 UNITS tablet, Take 1 tablet by mouth daily., Disp: , Rfl:  .  lisinopril-hydrochlorothiazide (PRINZIDE,ZESTORETIC) 20-12.5 MG tablet, Take 1 tablet by mouth daily., Disp: 90 tablet, Rfl: 1 .  Magnesium 250 MG TABS, Take 1 tablet by mouth daily., Disp: , Rfl:  .  metaxalone (SKELAXIN) 800 MG tablet, Take 1 tablet (800 mg total) by mouth 3 (three) times daily as needed for muscle spasms., Disp: 90 tablet, Rfl: 1 .  nabumetone (RELAFEN) 500 MG tablet, Take by mouth., Disp: , Rfl:  .  pravastatin (PRAVACHOL) 40 MG tablet, Take 1 tablet (40 mg total) by mouth daily., Disp: 90 tablet, Rfl: 1 .  traMADol (ULTRAM) 50 MG tablet, Take 1 tablet (50 mg total) by mouth 2 (two) times daily as needed., Disp: 60 tablet, Rfl: 2 .  vitamin C (ASCORBIC ACID) 500 MG tablet, Take 1 tablet by mouth daily., Disp: , Rfl:  .  zolpidem (AMBIEN CR) 12.5 MG CR tablet, Take 1 tablet (12.5 mg total) by mouth at bedtime., Disp: 30 tablet, Rfl: 2 .  fluticasone (FLONASE) 50 MCG/ACT nasal spray, Place 2 sprays into both nostrils daily. (Patient not taking: Reported on 08/12/2016), Disp: 16 g, Rfl: 2 .  loratadine (CLARITIN) 10 MG tablet, Take 1 tablet (10 mg total) by mouth daily. (Patient not taking: Reported on 08/12/2016), Disp: 30 tablet, Rfl: 5  No Known Allergies   ROS  Constitutional: Negative for fever or weight change.  Respiratory: Negative for cough and shortness of breath.   Cardiovascular: Negative for chest pain or palpitations.  Gastrointestinal: Negative for abdominal pain, no bowel changes.  Musculoskeletal: Negative for gait problem or joint swelling.  Skin: Negative for rash.   Neurological: Negative for dizziness or headache.  No other specific complaints in a complete review of systems (except as listed in HPI above).  Objective  Vitals:   08/12/16 0844  BP: 136/64  Pulse: 67  Resp: 16  Temp: 98.5 F (36.9 C)  TempSrc: Oral  SpO2: 97%  Weight: 174 lb 12.8 oz  (79.3 kg)  Height: 5' 3.25" (1.607 m)    Body mass index is 30.72 kg/m.  Physical Exam  Constitutional: Patient appears well-developed and obese. No distress.  HENT: Head: Normocephalic and atraumatic. Ears: B TMs ok, no erythema or effusion; Nose: Nose normal. Mouth/Throat: Oropharynx is clear and moist. No oropharyngeal exudate.  Eyes: Conjunctivae and EOM are normal. Pupils are equal, round, and reactive to light. No scleral icterus.  Neck: Normal range of motion. Neck supple. No JVD present. No thyromegaly present.  Cardiovascular: Normal rate, regular rhythm and normal heart sounds.  No murmur heard.  No BLE edema. Pulmonary/Chest: Effort normal and breath sounds normal. No respiratory distress. Abdominal: Soft. Bowel sounds are normal, no distension. There is no tenderness. no masses Breast: no lumps or masses, no nipple discharge or rashes FEMALE GENITALIA:  External genitalia normal External urethra normal Pelvic not done RECTAL: not done Musculoskeletal: Normal range of motion, no joint effusions. Left knee status post replacement, no crepitus on right side Neurological: he is alert and oriented to person, place, and time. No cranial nerve deficit. Coordination, balance, strength, speech and gait are normal.  Skin: Skin is warm and dry. No rash noted. No erythema.  Psychiatric: Patient has a normal mood and affect. behavior is normal. Judgment and thought content normal.  Recent Results (from the past 2160 hour(s))  Surgical pathology     Status: None   Collection Time: 06/30/16 12:10 PM  Result Value Ref Range   SURGICAL PATHOLOGY      Surgical Pathology CASE: [id number] PATIENT: [facility] Surgical Pathology Report     SPECIMEN SUBMITTED: A. Colon polyp, ascending; cbx  CLINICAL HISTORY: None provided  PRE-OPERATIVE DIAGNOSIS: HX of colon polyps Z86.010  POST-OPERATIVE DIAGNOSIS: Colon polyp, diverticulosis, hemorrhoids     DIAGNOSIS: A. COLON  POLYP, ASCENDING; COLD BIOPSY: - TUBULAR ADENOMA. - NEGATIVE FOR HIGH-GRADE DYSPLASIA AND MALIGNANCY. - MELANOSIS COLI.   GROSS DESCRIPTION: A. Labeled: Ascending colon polyp cold biopsy  Tissue fragment(s): 1  Size: 0.6 cm  Description: Tan tissue fragment  Entirely submitted in one cassette(s).     Final Diagnosis performed by Delorse Lek, MD.  Electronically signed 07/01/2016 9:02:08AM    The electronic signature indicates that the named Attending Pathologist has evaluated the specimen  Technical component performed at [facility], [address] [place], [place] [address] Lab: [id number] Dir: Darrick Penna. Evette Doffing, MD  P rofessional component performed at [facility], [facility], [place], [place], [place] [address] Lab: [phone] Dir: Dellia Nims. Rubinas, MD        PHQ2/9: Depression screen [facility] 2/9 08/12/2016 01/20/2016 11/05/2015 07/02/2015 03/05/2015  Decreased Interest 0 0 0 0 0  Down, Depressed, Hopeless 0 0 0 0 0  PHQ - 2 Score 0 0 0 0 0     Fall Risk: Fall Risk  08/12/2016 01/20/2016 11/05/2015 07/02/2015 03/05/2015  Falls in the past year? No No No No No     Functional Status Survey: Is the patient deaf or have difficulty hearing?: No Does the patient have difficulty seeing, even when wearing glasses/contacts?: No Does the patient have difficulty concentrating, remembering, or making decisions?: No Does the patient have difficulty walking or climbing stairs?: No Does the patient have difficulty dressing or bathing?: No Does the patient have difficulty doing errands alone such as visiting a doctor's office or shopping?: No   Assessment & Plan  1. Well woman exam  Discussed importance of 150 minutes of physical activity weekly, eat two servings of fish weekly, eat one serving of tree nuts ( cashews, pistachios, pecans, almonds.Marland Kitchen) every other day, eat 6 servings of fruit/vegetables daily and drink plenty of water and avoid sweet  beverages.  - COMPLETE METABOLIC PANEL WITH GFR - CBC with Differential/Platelet - Lipid panel - Insulin, fasting - Hemoglobin A1c - VITAMIN D 25 Hydroxy (Vit-D Deficiency, Fractures) - Vitamin B12 - EKG 12-Lead  2. Need for Td vaccine  - Td : Tetanus/diphtheria >7yo Preservative  free  3. Breast cancer screening  - MM Digital Screening; Future  4. Primary osteoarthritis of both knees  -  traMADol (ULTRAM) 50 MG tablet; Take 1 tablet (50 mg total) by mouth 2 (two) times daily as needed.  Dispense: 60 tablet; Refill: 2  5. Changing sleep-work schedule, affecting sleep  Takes Ambien every night, unable to sleep on lower dose, aware of FDA ruling  6. Chronic insomnia  - zolpidem (AMBIEN CR) 12.5 MG CR tablet; Take 1 tablet (12.5 mg total) by mouth at bedtime.  Dispense: 30 tablet; Refill: 2  7. Chronic right-sided low back pain with right-sided sciatica  - traMADol (ULTRAM) 50 MG tablet; Take 1 tablet (50 mg total) by mouth 2 (two) times daily as needed.  Dispense: 60 tablet; Refill: 2  8. Benign essential HTN  - COMPLETE METABOLIC PANEL WITH GFR - CBC with Differential/Platelet - EKG 12-Lead  9. Hypercholesteremia  - Lipid panel  10. Vitamin D deficiency  - VITAMIN D 25 Hydroxy (Vit-D Deficiency, Fractures)  11. Diabetes mellitus screening  - Insulin, fasting - Hemoglobin A1c

## 2016-08-13 LAB — HEMOGLOBIN A1C
HEMOGLOBIN A1C: 5.8 % — AB (ref <5.7)
MEAN PLASMA GLUCOSE: 120 mg/dL

## 2016-08-13 LAB — VITAMIN D 25 HYDROXY (VIT D DEFICIENCY, FRACTURES): VIT D 25 HYDROXY: 40 ng/mL (ref 30–100)

## 2016-08-15 LAB — INSULIN, FASTING: Insulin fasting, serum: 14.1 u[IU]/mL (ref 2.0–19.6)

## 2016-08-25 ENCOUNTER — Ambulatory Visit: Payer: BLUE CROSS/BLUE SHIELD | Admitting: Family Medicine

## 2016-10-03 ENCOUNTER — Other Ambulatory Visit: Payer: Self-pay | Admitting: Family Medicine

## 2016-10-03 DIAGNOSIS — I10 Essential (primary) hypertension: Secondary | ICD-10-CM

## 2016-10-03 NOTE — Telephone Encounter (Signed)
Patient requesting refill of Lisinopril-HCTZ and Amlodipine to [facility].

## 2016-12-15 ENCOUNTER — Encounter: Payer: Self-pay | Admitting: Family Medicine

## 2016-12-15 ENCOUNTER — Ambulatory Visit (INDEPENDENT_AMBULATORY_CARE_PROVIDER_SITE_OTHER): Payer: BLUE CROSS/BLUE SHIELD | Admitting: Family Medicine

## 2016-12-15 VITALS — BP 138/64 | HR 75 | Temp 98.2°F | Resp 16 | Ht 63.0 in | Wt 172.9 lb

## 2016-12-15 DIAGNOSIS — G4700 Insomnia, unspecified: Secondary | ICD-10-CM | POA: Diagnosis not present

## 2016-12-15 DIAGNOSIS — M5441 Lumbago with sciatica, right side: Secondary | ICD-10-CM | POA: Diagnosis not present

## 2016-12-15 DIAGNOSIS — I10 Essential (primary) hypertension: Secondary | ICD-10-CM | POA: Diagnosis not present

## 2016-12-15 DIAGNOSIS — E7800 Pure hypercholesterolemia, unspecified: Secondary | ICD-10-CM

## 2016-12-15 DIAGNOSIS — K5909 Other constipation: Secondary | ICD-10-CM | POA: Diagnosis not present

## 2016-12-15 DIAGNOSIS — G4726 Circadian rhythm sleep disorder, shift work type: Secondary | ICD-10-CM

## 2016-12-15 DIAGNOSIS — Z23 Encounter for immunization: Secondary | ICD-10-CM

## 2016-12-15 DIAGNOSIS — G8929 Other chronic pain: Secondary | ICD-10-CM

## 2016-12-15 DIAGNOSIS — M170 Bilateral primary osteoarthritis of knee: Secondary | ICD-10-CM | POA: Diagnosis not present

## 2016-12-15 MED ORDER — ZOLPIDEM TARTRATE ER 12.5 MG PO TBCR: 12.5000 mg | EXTENDED_RELEASE_TABLET | Freq: Every day | ORAL | 2 refills | Status: DC

## 2016-12-15 MED ORDER — TRAMADOL HCL 50 MG PO TABS: 50.0000 mg | ORAL_TABLET | Freq: Two times a day (BID) | ORAL | 2 refills | Status: DC | PRN

## 2016-12-15 MED ORDER — PRAVASTATIN SODIUM 40 MG PO TABS: 40.0000 mg | ORAL_TABLET | Freq: Every day | ORAL | 1 refills | Status: DC

## 2016-12-15 MED ORDER — METAXALONE 800 MG PO TABS: 800.0000 mg | ORAL_TABLET | Freq: Three times a day (TID) | ORAL | 1 refills | Status: DC | PRN

## 2016-12-15 MED ORDER — LUBIPROSTONE 24 MCG PO CAPS
24.0000 ug | ORAL_CAPSULE | Freq: Two times a day (BID) | ORAL | 5 refills | Status: DC
Start: 2016-12-15 — End: 2017-03-23

## 2016-12-15 NOTE — Patient Instructions (Addendum)
- Call your pharmacy and ask about your Amitiza Prescription - it should be at the pharmacy already.  Back Exercises If you have pain in your back, do these exercises 2-3 times each day or as told by your doctor. When the pain goes away, do the exercises once each day, but repeat the steps more times for each exercise (do more repetitions). If you do not have pain in your back, do these exercises once each day or as told by your doctor. Exercises Single Knee to Chest  Do these steps 3-5 times in a row for each leg: 1. Lie on your back on a firm bed or the floor with your legs stretched out. 2. Bring one knee to your chest. 3. Hold your knee to your chest by grabbing your knee or thigh. 4. Pull on your knee until you feel a gentle stretch in your lower back. 5. Keep doing the stretch for 10-30 seconds. 6. Slowly let go of your leg and straighten it.  Pelvic Tilt  Do these steps 5-10 times in a row: 1. Lie on your back on a firm bed or the floor with your legs stretched out. 2. Bend your knees so they point up to the ceiling. Your feet should be flat on the floor. 3. Tighten your lower belly (abdomen) muscles to press your lower back against the floor. This will make your tailbone point up to the ceiling instead of pointing down to your feet or the floor. 4. Stay in this position for 5-10 seconds while you gently tighten your muscles and breathe evenly.  Cat-Cow  Do these steps until your lower back bends more easily: 1. Get on your hands and knees on a firm surface. Keep your hands under your shoulders, and keep your knees under your hips. You may put padding under your knees. 2. Let your head hang down, and make your tailbone point down to the floor so your lower back is round like the back of a cat. 3. Stay in this position for 5 seconds. 4. Slowly lift your head and make your tailbone point up to the ceiling so your back hangs low (sags) like the back of a cow. 5. Stay in this  position for 5 seconds.  Press-Ups  Do these steps 5-10 times in a row: 1. Lie on your belly (face-down) on the floor. 2. Place your hands near your head, about shoulder-width apart. 3. While you keep your back relaxed and keep your hips on the floor, slowly straighten your arms to raise the top half of your body and lift your shoulders. Do not use your back muscles. To make yourself more comfortable, you may change where you place your hands. 4. Stay in this position for 5 seconds. 5. Slowly return to lying flat on the floor.  Bridges  Do these steps 10 times in a row: 1. Lie on your back on a firm surface. 2. Bend your knees so they point up to the ceiling. Your feet should be flat on the floor. 3. Tighten your butt muscles and lift your butt off of the floor until your waist is almost as high as your knees. If you do not feel the muscles working in your butt and the back of your thighs, slide your feet 1-2 inches farther away from your butt. 4. Stay in this position for 3-5 seconds. 5. Slowly lower your butt to the floor, and let your butt muscles relax.  If this exercise is too easy,  try doing it with your arms crossed over your chest. Belly Crunches  Do these steps 5-10 times in a row: 1. Lie on your back on a firm bed or the floor with your legs stretched out. 2. Bend your knees so they point up to the ceiling. Your feet should be flat on the floor. 3. Cross your arms over your chest. 4. Tip your chin a little bit toward your chest but do not bend your neck. 5. Tighten your belly muscles and slowly raise your chest just enough to lift your shoulder blades a tiny bit off of the floor. 6. Slowly lower your chest and your head to the floor.  Back Lifts Do these steps 5-10 times in a row: 1. Lie on your belly (face-down) with your arms at your sides, and rest your forehead on the floor. 2. Tighten the muscles in your legs and your butt. 3. Slowly lift your chest off of the floor  while you keep your hips on the floor. Keep the back of your head in line with the curve in your back. Look at the floor while you do this. 4. Stay in this position for 3-5 seconds. 5. Slowly lower your chest and your face to the floor.  Contact a doctor if:  Your back pain gets a lot worse when you do an exercise.  Your back pain does not lessen 2 hours after you exercise. If you have any of these problems, stop doing the exercises. Do not do them again unless your doctor says it is okay. Get help right away if:  You have sudden, very bad back pain. If this happens, stop doing the exercises. Do not do them again unless your doctor says it is okay. This information is not intended to replace advice given to you by your health care provider. Make sure you discuss any questions you have with your health care provider. Document Released: 05/07/2010 Document Revised: 09/10/2015 Document Reviewed: 05/29/2014 Elsevier Interactive Patient Education  [employer].

## 2016-12-15 NOTE — Addendum Note (Signed)
Addended by: Steele Sizer F on: 12/15/2016 10:45 AM   Modules accepted: Orders

## 2016-12-15 NOTE — Progress Notes (Signed)
Name: Hayley Howe   MRN: [id number]    DOB: 25-Nov-1945   Date:12/15/2016       Progress Note  Subjective  Chief Complaint  Chief Complaint  Patient presents with  . Insomnia    4 month follow up  . Pain  . Hypertension    medication refill    HPI  Chronic constipation: she has always been constipated, bowel movements only with laxatives and about 3 times per week, colonoscopy showed melanosis coli. Discussed options on her last visit and we sent Amitiza,  but she never started it because she didn't know she had a prescription at the pharmacy.   HTN: taking medication, denies side effects of medication bp is at goal with lower dose of lisinopril HCTZ from two pills daily to one pill daily,she denies  blurred vision, headaches, BLE swelling, chest pain or palpitation. She denies orthostatic changes, still works full time. Does not check BP's at home.  Hyperlipidemia: taking pravastatin and denies myalgias; last Lipids 07/2016 looks excellent, we will continue medication.  Chronic Low Back: she has a long history of low back pain, not secondary to trauma, DDD lumbar spine, taking Tramadol daily to control symptoms (takes 1 daily, takes 2nd tablets a few times a week) and now on Skelaxin several times a week instead of Flexeril because it is less sedating. Occasionally the pain radiates to bilateral thighs, and when radiating it is described as numbness. Pain at this time is 3/10 at this time - says she was off work last night so she's feeling good today.  Pain at the end of a work day is down to 6-7/10. She goes to chiropractor ( Dr. Rock Nephew) once a month and seems to be helping.  She does walk almost daily, is not doing any back strengthening exercises - we will provide today.  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 She sleeps from around 1:30pm-8:30pm for about 5-7 hours when she sleeps during the day and about 5 hours during the night.  Taking Ambien, denies side effects of medication, she is aware of FDA guidelines however unable to sleep with lower dose of Ambien.  OA: she has aching pain on left knee, s/p knee replaced, only triggered by kneeling down, surgery was done by Dr. Rudene Christians in 2012, she was given rx of Relafen but only takes it prn   Health Maintenance: Needs Mammogram and Flu shot.  Patient Active Problem List   Diagnosis Date Noted  . Primary osteoarthritis of left hip 11/05/2015  . Scoliosis 11/05/2015  . Chronic low back pain 10/30/2014  . Benign essential HTN 10/27/2014  . Bradycardia 10/27/2014  . Insomnia 10/27/2014  . Headache, temporal 10/27/2014  . Hypercholesteremia 10/27/2014  . Obesity (BMI 30-39.9) 10/27/2014  . Changing sleep-work schedule, affecting sleep 10/27/2014  . Vitamin D deficiency 10/27/2014  . Arthritis of knee, degenerative 09/24/2009    Past Surgical History:  Procedure Laterality Date  . ABDOMINAL HYSTERECTOMY    . BREAST BIOPSY Right 01/21/2013   stereo - benign  . COLONOSCOPY WITH PROPOFOL N/A 06/30/2016   Procedure: COLONOSCOPY WITH PROPOFOL;  Surgeon: Jonathon Bellows, MD;  Location: [facility] ENDOSCOPY;  Service: Endoscopy;  Laterality: N/A;  . KNEE SURGERY Left 2012   Dr. Rudene Christians  . TUBAL LIGATION    . VAGINAL PROLAPSE REPAIR      Family History  Problem Relation Age of Onset  . Breast cancer Sister 43  in year 2013  . Cancer Sister        Breast-2 sisters  . Hyperlipidemia Mother   . Diabetes Mother   . Cancer Father        Leukemia  . Hyperlipidemia Father   . Cancer Brother        Lung    Social History   Social History  . Marital status: Widowed    Spouse name: N/A  . Number of children: N/A  . Years of education: N/A   Occupational History  . Not on file.   Social History Main Topics  . Smoking status: Former Smoker    Years: 1.00    Types: Cigarettes    Start date: 08/13/1967    Quit date: 04/18/1977  . Smokeless tobacco: Never Used      Comment: Only smoked for a year  . Alcohol use No  . Drug use: No  . Sexual activity: Not Currently   Other Topics Concern  . Not on file   Social History Narrative  . No narrative on file     Current Outpatient Prescriptions:  .  amLODipine (NORVASC) 5 MG tablet, TAKE ONE TABLET BY MOUTH ONCE DAILY, Disp: 90 tablet, Rfl: 1 .  aspirin 81 MG tablet, Take 1 tablet by mouth daily., Disp: , Rfl:  .  Cholecalciferol (VITAMIN D) 2000 UNITS tablet, Take 1 tablet by mouth daily., Disp: , Rfl:  .  fluticasone (FLONASE) 50 MCG/ACT nasal spray, Place 2 sprays into both nostrils daily., Disp: 16 g, Rfl: 2 .  lisinopril-hydrochlorothiazide (PRINZIDE,ZESTORETIC) 20-12.5 MG tablet, TAKE 1 TABLET BY MOUTH ONCE DAILY, Disp: 90 tablet, Rfl: 1 .  loratadine (CLARITIN) 10 MG tablet, Take 1 tablet (10 mg total) by mouth daily., Disp: 30 tablet, Rfl: 5 .  lubiprostone (AMITIZA) 24 MCG capsule, Take 1 capsule (24 mcg total) by mouth 2 (two) times daily with a meal., Disp: 60 capsule, Rfl: 5 .  Magnesium 250 MG TABS, Take 1 tablet by mouth daily., Disp: , Rfl:  .  metaxalone (SKELAXIN) 800 MG tablet, Take 1 tablet (800 mg total) by mouth 3 (three) times daily as needed for muscle spasms., Disp: 90 tablet, Rfl: 1 .  pravastatin (PRAVACHOL) 40 MG tablet, Take 1 tablet (40 mg total) by mouth daily., Disp: 90 tablet, Rfl: 1 .  traMADol (ULTRAM) 50 MG tablet, Take 1 tablet (50 mg total) by mouth 2 (two) times daily as needed., Disp: 60 tablet, Rfl: 2 .  vitamin C (ASCORBIC ACID) 500 MG tablet, Take 1 tablet by mouth daily., Disp: , Rfl:  .  zolpidem (AMBIEN CR) 12.5 MG CR tablet, Take 1 tablet (12.5 mg total) by mouth at bedtime., Disp: 30 tablet, Rfl: 2  No Known Allergies   ROS  Constitutional: Negative for fever or weight change.  Respiratory: Negative for cough and shortness of breath.   Cardiovascular: Negative for chest pain or palpitations.  Gastrointestinal: Negative for abdominal pain, no bowel  changes.  Musculoskeletal: Negative for gait problem or joint swelling. See HPI Skin: Negative for rash.  Neurological: Negative for dizziness or headache.  No other specific complaints in a complete review of systems (except as listed in HPI above).  Objective  Vitals:   12/15/16 0813  BP: 138/64  Pulse: 75  Resp: 16  Temp: 98.2 F (36.8 C)  TempSrc: Oral  SpO2: 97%  Weight: 172 lb 14.4 oz (78.4 kg)  Height: 5\' 3"  (1.6 m)   Body mass index is 30.63 kg/m.  Physical Exam   Constitutional: Patient appears well-developed and well-nourished. Obese No distress.  HEENT: head atraumatic, normocephalic, pupils equal and reactive to light, neck supple, throat within normal limits Cardiovascular: Normal rate, regular rhythm and normal heart sounds.  No murmur heard. No BLE edema. Pulmonary/Chest: Effort normal and breath sounds normal. No respiratory distress. Abdominal: Soft.  There is no tenderness. Psychiatric: Patient has a normal mood and affect. behavior is normal. Judgment and thought content normal. Muscular Skeletal: no tenderness during palpation of lumbar spine, negative straight leg raise, pain worse with extension , normal flexion and lateral bending  No results found for this or any previous visit (from the past 2160 hour(s)).  PHQ2/9: Depression screen [facility] 2/9 08/12/2016 01/20/2016 11/05/2015 07/02/2015 03/05/2015  Decreased Interest 0 0 0 0 0  Down, Depressed, Hopeless 0 0 0 0 0  PHQ - 2 Score 0 0 0 0 0    Fall Risk: Fall Risk  08/12/2016 01/20/2016 11/05/2015 07/02/2015 03/05/2015  Falls in the past year? No No No No No   Assessment & Plan  1. Benign essential HTN - Stable, continue medications as prescribed  2. Primary osteoarthritis of both knees - traMADol (ULTRAM) 50 MG tablet; Take 1 tablet (50 mg total) by mouth 2 (two) times daily as needed.  Dispense: 60 tablet; Refill: 2  3. Chronic right-sided low back pain with right-sided sciatica  - Start back  exercises as tolerated. - traMADol (ULTRAM) 50 MG tablet; Take 1 tablet (50 mg total) by mouth 2 (two) times daily as needed.  Dispense: 60 tablet; Refill: 2 - metaxalone (SKELAXIN) 800 MG tablet; Take 1 tablet (800 mg total) by mouth 3 (three) times daily as needed for muscle spasms.  Dispense: 90 tablet; Refill: 1  4. Insomnia, unspecified type - zolpidem (AMBIEN CR) 12.5 MG CR tablet; Take 1 tablet (12.5 mg total) by mouth at bedtime.  Dispense: 30 tablet; Refill: 2  5. Changing sleep-work schedule, affecting sleep - zolpidem (AMBIEN CR) 12.5 MG CR tablet; Take 1 tablet (12.5 mg total) by mouth at bedtime.  Dispense: 30 tablet; Refill: 2  6. Hypercholesteremia - pravastatin (PRAVACHOL) 40 MG tablet; Take 1 tablet (40 mg total) by mouth daily.  Dispense: 90 tablet; Refill: 1  7. Chronic constipation Fill amitiza Rx at the pharmacy - if no longer able to fill, pt will call and we will reorder  8. Need for influenza vaccination - Flu vaccine HIGH DOSE PF (Fluzone High Dose)  -Reviewed Health Maintenance: Will call to schedule mammogram - [facility] card given

## 2017-02-27 ENCOUNTER — Telehealth: Payer: Self-pay

## 2017-02-27 NOTE — Telephone Encounter (Signed)
Left voicemail regarding patient calling earlier about her BP concerns. Informed patient to call back with concerns so I could route her questions to Dr. Ancil Boozer.

## 2017-02-27 NOTE — Telephone Encounter (Signed)
Copied from [place] [id number]. Topic: General - Other >> Feb 27, 2017 11:57 AM Darl Householder, RMA wrote: Reason for CRM: patient is requesting a call back from Dr. Ancil Boozer [place] concerning her BP, please return pt call at [phone]

## 2017-03-01 ENCOUNTER — Ambulatory Visit (INDEPENDENT_AMBULATORY_CARE_PROVIDER_SITE_OTHER): Payer: BLUE CROSS/BLUE SHIELD | Admitting: Family Medicine

## 2017-03-01 ENCOUNTER — Encounter: Payer: Self-pay | Admitting: Family Medicine

## 2017-03-01 DIAGNOSIS — I10 Essential (primary) hypertension: Secondary | ICD-10-CM

## 2017-03-01 MED ORDER — AMLODIPINE BESYLATE 5 MG PO TABS: 10.0000 mg | ORAL_TABLET | Freq: Every day | ORAL | 0 refills | Status: DC

## 2017-03-01 NOTE — Patient Instructions (Addendum)
Please take 10mg  of your Amlodipine (2tablets of the 5mg  Amlodipine) at 8:00am with your other daily medications.   Please take your Lisinopril-Hydrochlorothiazide at night before work. DASH Eating Plan DASH stands for "Dietary Approaches to Stop Hypertension." The DASH eating plan is a healthy eating plan that has been shown to reduce high blood pressure (hypertension). It may also reduce your risk for type 2 diabetes, heart disease, and stroke. The DASH eating plan may also help with weight loss. What are tips for following this plan? General guidelines  Avoid eating more than 2,300 mg (milligrams) of salt (sodium) a day. If you have hypertension, you may need to reduce your sodium intake to 1,500 mg a day.  Limit alcohol intake to no more than 1 drink a day for nonpregnant women and 2 drinks a day for men. One drink equals 12 oz of beer, 5 oz of wine, or 1 oz of hard liquor.  Work with your health care provider to maintain a healthy body weight or to lose weight. Ask what an ideal weight is for you.  Get at least 30 minutes of exercise that causes your heart to beat faster (aerobic exercise) most days of the week. Activities may include walking, swimming, or biking.  Work with your health care provider or diet and nutrition specialist (dietitian) to adjust your eating plan to your individual calorie needs. Reading food labels  Check food labels for the amount of sodium per serving. Choose foods with less than 5 percent of the Daily Value of sodium. Generally, foods with less than 300 mg of sodium per serving fit into this eating plan.  To find whole grains, look for the word "whole" as the first word in the ingredient list. Shopping  Buy products labeled as "low-sodium" or "no salt added."  Buy fresh foods. Avoid canned foods and premade or frozen meals. Cooking  Avoid adding salt when cooking. Use salt-free seasonings or herbs instead of table salt or sea salt. Check with your  health care provider or pharmacist before using salt substitutes.  Do not fry foods. Cook foods using healthy methods such as baking, boiling, grilling, and broiling instead.  Cook with heart-healthy oils, such as olive, canola, soybean, or sunflower oil. Meal planning   Eat a balanced diet that includes: ? 5 or more servings of fruits and vegetables each day. At each meal, try to fill half of your plate with fruits and vegetables. ? Up to 6-8 servings of whole grains each day. ? Less than 6 oz of lean meat, poultry, or fish each day. A 3-oz serving of meat is about the same size as a deck of cards. One egg equals 1 oz. ? 2 servings of low-fat dairy each day. ? A serving of nuts, seeds, or beans 5 times each week. ? Heart-healthy fats. Healthy fats called Omega-3 fatty acids are found in foods such as flaxseeds and coldwater fish, like sardines, salmon, and mackerel.  Limit how much you eat of the following: ? Canned or prepackaged foods. ? Food that is high in trans fat, such as fried foods. ? Food that is high in saturated fat, such as fatty meat. ? Sweets, desserts, sugary drinks, and other foods with added sugar. ? Full-fat dairy products.  Do not salt foods before eating.  Try to eat at least 2 vegetarian meals each week.  Eat more home-cooked food and less restaurant, buffet, and fast food.  When eating at a restaurant, ask that your food be  prepared with less salt or no salt, if possible. What foods are recommended? The items listed may not be a complete list. Talk with your dietitian about what dietary choices are best for you. Grains Whole-grain or whole-wheat bread. Whole-grain or whole-wheat pasta. Brown rice. [place]. Bulgur. Whole-grain and low-sodium cereals. Pita bread. Low-fat, low-sodium crackers. Whole-wheat flour tortillas. Vegetables Fresh or frozen vegetables (raw, steamed, roasted, or grilled). Low-sodium or reduced-sodium tomato and vegetable juice.  Low-sodium or reduced-sodium tomato sauce and tomato paste. Low-sodium or reduced-sodium canned vegetables. Fruits All fresh, dried, or frozen fruit. Canned fruit in natural juice (without added sugar). Meat and other protein foods Skinless chicken or [place]. Ground chicken or [place]. Pork with fat trimmed off. Fish and seafood. Egg whites. Dried beans, peas, or lentils. Unsalted nuts, nut butters, and seeds. Unsalted canned beans. Lean cuts of beef with fat trimmed off. Low-sodium, lean deli meat. Dairy Low-fat (1%) or fat-free (skim) milk. Fat-free, low-fat, or reduced-fat cheeses. Nonfat, low-sodium ricotta or cottage cheese. Low-fat or nonfat yogurt. Low-fat, low-sodium cheese. Fats and oils Soft margarine without trans fats. Vegetable oil. Low-fat, reduced-fat, or light mayonnaise and salad dressings (reduced-sodium). Canola, safflower, olive, soybean, and sunflower oils. Avocado. Seasoning and other foods Herbs. Spices. Seasoning mixes without salt. Unsalted popcorn and pretzels. Fat-free sweets. What foods are not recommended? The items listed may not be a complete list. Talk with your dietitian about what dietary choices are best for you. Grains Baked goods made with fat, such as croissants, muffins, or some breads. Dry pasta or rice meal packs. Vegetables Creamed or fried vegetables. Vegetables in a cheese sauce. Regular canned vegetables (not low-sodium or reduced-sodium). Regular canned tomato sauce and paste (not low-sodium or reduced-sodium). Regular tomato and vegetable juice (not low-sodium or reduced-sodium). Angie Fava. Olives. Fruits Canned fruit in a light or heavy syrup. Fried fruit. Fruit in cream or butter sauce. Meat and other protein foods Fatty cuts of meat. Ribs. Fried meat. Berniece Salines. Sausage. Bologna and other processed lunch meats. Salami. Fatback. Hotdogs. Bratwurst. Salted nuts and seeds. Canned beans with added salt. Canned or smoked fish. Whole eggs or egg yolks. Chicken  or [place] with skin. Dairy Whole or 2% milk, cream, and half-and-half. Whole or full-fat cream cheese. Whole-fat or sweetened yogurt. Full-fat cheese. Nondairy creamers. Whipped toppings. Processed cheese and cheese spreads. Fats and oils Butter. Stick margarine. Lard. Shortening. Ghee. Bacon fat. Tropical oils, such as coconut, palm kernel, or palm oil. Seasoning and other foods Salted popcorn and pretzels. Onion salt, garlic salt, seasoned salt, table salt, and sea salt. Worcestershire sauce. Tartar sauce. Barbecue sauce. Teriyaki sauce. Soy sauce, including reduced-sodium. Steak sauce. Canned and packaged gravies. Fish sauce. Oyster sauce. Cocktail sauce. Horseradish that you find on the shelf. Ketchup. Mustard. Meat flavorings and tenderizers. Bouillon cubes. Hot sauce and Tabasco sauce. Premade or packaged marinades. Premade or packaged taco seasonings. Relishes. Regular salad dressings. Where to find more information:  National Heart, Lung, and [place]: https://wilson-eaton.com/  American Heart Association: www.heart.org Summary  The DASH eating plan is a healthy eating plan that has been shown to reduce high blood pressure (hypertension). It may also reduce your risk for type 2 diabetes, heart disease, and stroke.  With the DASH eating plan, you should limit salt (sodium) intake to 2,300 mg a day. If you have hypertension, you may need to reduce your sodium intake to 1,500 mg a day.  When on the DASH eating plan, aim to eat more fresh fruits and vegetables, whole grains,  lean proteins, low-fat dairy, and heart-healthy fats.  Work with your health care provider or diet and nutrition specialist (dietitian) to adjust your eating plan to your individual calorie needs. This information is not intended to replace advice given to you by your health care provider. Make sure you discuss any questions you have with your health care provider. Document Released: 03/24/2011 Document Revised:  03/28/2016 Document Reviewed: 03/28/2016 Elsevier Interactive Patient Education  2017 [employer].

## 2017-03-01 NOTE — Progress Notes (Addendum)
Name: Hayley Howe   MRN: [id number]    DOB: [birth year]   Date:03/01/2017       Progress Note  Subjective  Chief Complaint  Chief Complaint  Patient presents with  . Hypertension    BP elevated for 3 days 175/95, 164/92   . Headache    HPI  Patient presents with concern for increased BP with intermittent headaches for about a week. 3 days ago she developed a headache and checked her BP and it was 175/95.  She brings readings from yesterday with her today - 155/85, 153/78, 164/92. Today in the office her BP is 180/88.  She endorses a mild headache at present. Her headache is intermittent and around her temples; pain is described as throbbing and "nagging".  Labs in April 2018 show good kidney function; has not been taking any NSAIDS.  Current HTN Medications - takes all of her medications 8:00am when she gets home from work. Amlodipine 5mg  Lisinopril-HCTZ 20-12.5mg  Also taking pravastatin and aspirin.  She has not taken any medications yet today as she came straight from work.  Patient Active Problem List   Diagnosis Date Noted  . Chronic constipation 12/15/2016  . Primary osteoarthritis of left hip 11/05/2015  . Scoliosis 11/05/2015  . Chronic low back pain 10/30/2014  . Benign essential HTN 10/27/2014  . Bradycardia 10/27/2014  . Insomnia 10/27/2014  . Headache, temporal 10/27/2014  . Hypercholesteremia 10/27/2014  . Obesity (BMI 30-39.9) 10/27/2014  . Changing sleep-work schedule, affecting sleep 10/27/2014  . Vitamin D deficiency 10/27/2014  . Arthritis of knee, degenerative 09/24/2009    Social History   Tobacco Use  . Smoking status: Former Smoker    Years: 1.00    Types: Cigarettes    Start date: 08/13/1967    Last attempt to quit: 04/18/1977    Years since quitting: 39.8  . Smokeless tobacco: Never Used  . Tobacco comment: Only smoked for a year  Substance Use Topics  . Alcohol use: No    Alcohol/week: 0.0 oz     Current Outpatient Medications:  .   amLODipine (NORVASC) 5 MG tablet, Take 2 tablets (10 mg total) daily by mouth., Disp: 90 tablet, Rfl: 0 .  aspirin 81 MG tablet, Take 1 tablet by mouth daily., Disp: , Rfl:  .  Cholecalciferol (VITAMIN D) 2000 UNITS tablet, Take 1 tablet by mouth daily., Disp: , Rfl:  .  fluticasone (FLONASE) 50 MCG/ACT nasal spray, Place 2 sprays into both nostrils daily., Disp: 16 g, Rfl: 2 .  lisinopril-hydrochlorothiazide (PRINZIDE,ZESTORETIC) 20-12.5 MG tablet, TAKE 1 TABLET BY MOUTH ONCE DAILY, Disp: 90 tablet, Rfl: 1 .  loratadine (CLARITIN) 10 MG tablet, Take 1 tablet (10 mg total) by mouth daily., Disp: 30 tablet, Rfl: 5 .  lubiprostone (AMITIZA) 24 MCG capsule, Take 1 capsule (24 mcg total) by mouth 2 (two) times daily with a meal., Disp: 60 capsule, Rfl: 5 .  Magnesium 250 MG TABS, Take 1 tablet by mouth daily., Disp: , Rfl:  .  pravastatin (PRAVACHOL) 40 MG tablet, Take 1 tablet (40 mg total) by mouth daily., Disp: 90 tablet, Rfl: 1 .  traMADol (ULTRAM) 50 MG tablet, Take 1 tablet (50 mg total) by mouth 2 (two) times daily as needed., Disp: 60 tablet, Rfl: 2 .  vitamin C (ASCORBIC ACID) 500 MG tablet, Take 1 tablet by mouth daily., Disp: , Rfl:  .  zolpidem (AMBIEN CR) 12.5 MG CR tablet, Take 1 tablet (12.5 mg total) by mouth at bedtime., Disp:  30 tablet, Rfl: 2 .  metaxalone (SKELAXIN) 800 MG tablet, Take 1 tablet (800 mg total) by mouth 3 (three) times daily as needed for muscle spasms. (Patient not taking: Reported on 03/01/2017), Disp: 90 tablet, Rfl: 1  No Known Allergies  ROS  Constitutional: Negative for fever or weight change.  Respiratory: Negative for cough and shortness of breath.   Cardiovascular: Negative for chest pain or palpitations.  Gastrointestinal: Negative for abdominal pain, no bowel changes.  Musculoskeletal: Negative for gait problem or joint swelling.  Skin: Negative for rash.  Neurological: Negative for dizziness; endorses headache. No confusion, extremity weakness,  lightheadedness, vision changes, or BLE edema.  No other specific complaints in a complete review of systems (except as listed in HPI above).  Objective  Vitals:   03/01/17 0804 03/01/17 0834  BP: (!) 180/88 (!) 142/78  Pulse: 71   Resp: 16   Temp: 98 F (36.7 C)   TempSrc: Oral   SpO2: 95%   Weight: 171 lb 14.4 oz (78 kg)   Height: 5\' 3"  (1.6 m)    Body mass index is 30.45 kg/m.  Nursing Note and Vital Signs reviewed. BP elevated as expected - see Plan for details  Physical Exam  Constitutional: Patient appears well-developed and well-nourished.  No distress.  HEENT: head atraumatic, normocephalic, pupils equal and reactive to light, EOM's intact Cardiovascular: Normal rate, regular rhythm, S1/S2 present.  No murmur or rub heard. No BLE edema. Pulmonary/Chest: Effort normal and breath sounds clear. No respiratory distress or retractions. Psychiatric: Patient has a normal mood and affect. behavior is normal. Judgment and thought content normal. Musculoskeletal: Normal range of motion, no joint effusions. No gross deformities Neurological: he is alert and oriented to person, place, and time. No cranial nerve deficit. Coordination, balance, strength, speech and gait are normal.  Skin: Skin is warm and dry. No rash noted. No erythema.   No results found for this or any previous visit (from the past 2160 hour(s)).   Assessment & Plan  1. Benign essential HTN - amLODipine (NORVASC) 5 MG tablet; Take 2 tablets (10 mg total) daily by mouth.  Dispense: 90 tablet; Refill: 0 - We will increased Amlodipine to 10mg  and patient will return in 2 days for BP check. If showing improvement, we will keep patient at this dose, if not improving, we will consider increasing HCTZ dosing as well. - Stroke signs and symptoms reviewed with patient including FAST pneumonic. - DASH diet reinforced    -Red flags and when to present for emergency care or RTC including fever >101.35F, chest pain,  shortness of breath, new/worsening/un-resolving symptoms, stroke symptoms, reviewed with patient at time of visit. Follow up and care instructions discussed and provided in AVS.

## 2017-03-03 ENCOUNTER — Ambulatory Visit (INDEPENDENT_AMBULATORY_CARE_PROVIDER_SITE_OTHER): Payer: Medicare HMO

## 2017-03-03 DIAGNOSIS — I10 Essential (primary) hypertension: Secondary | ICD-10-CM

## 2017-03-03 NOTE — Progress Notes (Signed)
Patient was here today for a blood pressure check. Her blood pressure was 160/100 when checked the first time with a pulse of 81. After sitting for about 5 minutes her blood pressure decreased to 132/80 and her pulse was 80. She has been monitoring her blood pressure at home and it has been in the range of 129/72-157/87. She denied having chest pain, sob, palpitations or visual disturbances.

## 2017-03-23 ENCOUNTER — Encounter: Payer: Self-pay | Admitting: Family Medicine

## 2017-03-23 ENCOUNTER — Ambulatory Visit (INDEPENDENT_AMBULATORY_CARE_PROVIDER_SITE_OTHER): Payer: BLUE CROSS/BLUE SHIELD | Admitting: Family Medicine

## 2017-03-23 DIAGNOSIS — G8929 Other chronic pain: Secondary | ICD-10-CM | POA: Diagnosis not present

## 2017-03-23 DIAGNOSIS — I10 Essential (primary) hypertension: Secondary | ICD-10-CM

## 2017-03-23 DIAGNOSIS — M5441 Lumbago with sciatica, right side: Secondary | ICD-10-CM

## 2017-03-23 DIAGNOSIS — M170 Bilateral primary osteoarthritis of knee: Secondary | ICD-10-CM | POA: Diagnosis not present

## 2017-03-23 DIAGNOSIS — G4726 Circadian rhythm sleep disorder, shift work type: Secondary | ICD-10-CM | POA: Diagnosis not present

## 2017-03-23 DIAGNOSIS — G4700 Insomnia, unspecified: Secondary | ICD-10-CM | POA: Diagnosis not present

## 2017-03-23 MED ORDER — AMLODIPINE BESYLATE 10 MG PO TABS: 10.0000 mg | ORAL_TABLET | Freq: Every day | ORAL | 1 refills | Status: DC

## 2017-03-23 MED ORDER — LISINOPRIL-HYDROCHLOROTHIAZIDE 20-12.5 MG PO TABS: 1.0000 | ORAL_TABLET | Freq: Every day | ORAL | 1 refills | Status: DC

## 2017-03-23 MED ORDER — TRAMADOL HCL 50 MG PO TABS: 50.0000 mg | ORAL_TABLET | Freq: Two times a day (BID) | ORAL | 2 refills | Status: DC | PRN

## 2017-03-23 MED ORDER — ZOLPIDEM TARTRATE ER 12.5 MG PO TBCR: 12.5000 mg | EXTENDED_RELEASE_TABLET | Freq: Every day | ORAL | 2 refills | Status: DC

## 2017-03-23 NOTE — Progress Notes (Signed)
Name: Hayley Howe   MRN: [id number]    DOB: 1945/08/15   Date:03/23/2017       Progress Note  Subjective  Chief Complaint  Chief Complaint  Patient presents with  . Medication Refill  . Hypertension    BP has been running around 150-130/90-70  . Osteoarthritis    Stable  . Back Pain  . Insomnia    Doing well with medication  . Hyperlipidemia  . Constipation    Could not afford Amitiza-it was $200. Still taking Miralax and Colace going to the bathroom every other day.    HPI  Chronic constipation: she has always been constipated, bowel movements only with laxatives and about 3 times per week, colonoscopy showed melanosis coli. We gave her a rx of Amitiza but it was too expensive. She continues to take Colace and Miralax otc. Denies blood in stools.   HTN: bp was running high a few weeks ago and was associated with headaches. She saw NP Raelyn Ensign and dose of Norvasc was adjusted. She has been taking norvasc before work and lisinopril hctz in am. She usually gets home around 8 am and goes to sleep at 2 pm. BP is at goal today, no side effects of medication, denies orthostatic changes. No chest pain or palpitation, headache resolved. She denies any stress around the time that bp was going up.   Hyperlipidemia: taking pravastatin and denies myalgias; last Lipids 07/2016 looks excellent, continue current medication.   Chronic Low Back: she has a long history of low back pain, not secondary to trauma, DDD lumbar spine, taking Tramadol daily to control symptoms (takes 1 daily, takes 2nd tablets a few times a week) and now on Skelaxin several times a week instead of Flexeril because it is less sedating. Occasionally the pain radiates to bilateral thighs, and when radiating it is described as numbness. Pain at this time is 3/10 at this time - says she was off work last night so she's feeling good today. Pain at the end of a work day is down to 6/10. She goes to chiropractor ( Dr. Rock Nephew)  once a month and seems to be helping.  She does walk almost daily.   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 She sleeps from around 2 pm-8:30pm for about 5-7hours when she sleeps during the day and about 5 hours during the night.Taking Ambien, denies side effects of medication, she is aware of FDA guidelines however unable to sleep with lower dose of Ambien.  OA: she has aching pain on left knee, s/p knee replaced, only triggered by kneeling down, surgery was done by Dr. Rudene Christians in 2012.   Patient Active Problem List   Diagnosis Date Noted  . Chronic constipation 12/15/2016  . Primary osteoarthritis of left hip 11/05/2015  . Scoliosis 11/05/2015  . Chronic low back pain 10/30/2014  . Benign essential HTN 10/27/2014  . Bradycardia 10/27/2014  . Insomnia 10/27/2014  . Headache, temporal 10/27/2014  . Hypercholesteremia 10/27/2014  . Obesity (BMI 30-39.9) 10/27/2014  . Changing sleep-work schedule, affecting sleep 10/27/2014  . Vitamin D deficiency 10/27/2014  . Arthritis of knee, degenerative 09/24/2009    Past Surgical History:  Procedure Laterality Date  . ABDOMINAL HYSTERECTOMY    . BREAST BIOPSY Right 01/21/2013   stereo - benign  . COLONOSCOPY WITH PROPOFOL N/A 06/30/2016   Procedure: COLONOSCOPY WITH PROPOFOL;  Surgeon: Jonathon Bellows, MD;  Location: [facility] ENDOSCOPY;  Service: Endoscopy;  Laterality: N/A;  .  KNEE SURGERY Left 2012   Dr. Rudene Christians  . TUBAL LIGATION    . VAGINAL PROLAPSE REPAIR      Family History  Problem Relation Age of Onset  . Breast cancer Sister 70       in year 2013  . Cancer Sister        Breast-2 sisters  . Hyperlipidemia Mother   . Diabetes Mother   . Cancer Father        Leukemia  . Hyperlipidemia Father   . Cancer Brother        Lung    Social History   Socioeconomic History  . Marital status: Widowed    Spouse name: Not on file  . Number of children: Not on file  . Years of education: Not on  file  . Highest education level: Not on file  Social Needs  . Financial resource strain: Not on file  . Food insecurity - worry: Not on file  . Food insecurity - inability: Not on file  . Transportation needs - medical: Not on file  . Transportation needs - non-medical: Not on file  Occupational History  . Not on file  Tobacco Use  . Smoking status: Former Smoker    Years: 1.00    Types: Cigarettes    Start date: 08/13/1967    Last attempt to quit: 04/18/1977    Years since quitting: 39.9  . Smokeless tobacco: Never Used  . Tobacco comment: Only smoked for a year  Substance and Sexual Activity  . Alcohol use: No    Alcohol/week: 0.0 oz  . Drug use: No  . Sexual activity: Not Currently  Other Topics Concern  . Not on file  Social History Narrative  . Not on file     Current Outpatient Medications:  .  amLODipine (NORVASC) 5 MG tablet, Take 2 tablets (10 mg total) daily by mouth., Disp: 90 tablet, Rfl: 0 .  aspirin 81 MG tablet, Take 1 tablet by mouth daily., Disp: , Rfl:  .  Cholecalciferol (VITAMIN D) 2000 UNITS tablet, Take 1 tablet by mouth daily., Disp: , Rfl:  .  fluticasone (FLONASE) 50 MCG/ACT nasal spray, Place 2 sprays into both nostrils daily., Disp: 16 g, Rfl: 2 .  lisinopril-hydrochlorothiazide (PRINZIDE,ZESTORETIC) 20-12.5 MG tablet, TAKE 1 TABLET BY MOUTH ONCE DAILY, Disp: 90 tablet, Rfl: 1 .  Magnesium 250 MG TABS, Take 1 tablet by mouth daily., Disp: , Rfl:  .  metaxalone (SKELAXIN) 800 MG tablet, Take 1 tablet (800 mg total) by mouth 3 (three) times daily as needed for muscle spasms., Disp: 90 tablet, Rfl: 1 .  pravastatin (PRAVACHOL) 40 MG tablet, Take 1 tablet (40 mg total) by mouth daily., Disp: 90 tablet, Rfl: 1 .  traMADol (ULTRAM) 50 MG tablet, Take 1 tablet (50 mg total) by mouth 2 (two) times daily as needed., Disp: 60 tablet, Rfl: 2 .  vitamin C (ASCORBIC ACID) 500 MG tablet, Take 1 tablet by mouth daily., Disp: , Rfl:  .  zolpidem (AMBIEN CR) 12.5 MG  CR tablet, Take 1 tablet (12.5 mg total) by mouth at bedtime., Disp: 30 tablet, Rfl: 2 .  loratadine (CLARITIN) 10 MG tablet, Take 1 tablet (10 mg total) by mouth daily. (Patient not taking: Reported on 03/23/2017), Disp: 30 tablet, Rfl: 5  No Known Allergies   ROS  Constitutional: Negative for fever or weight change.  Respiratory: Negative for cough and shortness of breath.   Cardiovascular: Negative for chest pain or palpitations.  Gastrointestinal: Negative for abdominal pain, no bowel changes.  Musculoskeletal: Negative for gait problem or joint swelling.  Skin: Negative for rash.  Neurological: Negative for dizziness or headache.  No other specific complaints in a complete review of systems (except as listed in HPI above).  Objective  Vitals:   03/23/17 0913  BP: 126/78  Pulse: 80  Resp: 18  Temp: 98.6 F (37 C)  TempSrc: Oral  SpO2: 98%  Weight: 171 lb 4.8 oz (77.7 kg)  Height: 5\' 3"  (1.6 m)    Body mass index is 30.34 kg/m.  Physical Exam  Constitutional: Patient appears well-developed and well-nourished. Obese No distress.  HEENT: head atraumatic, normocephalic, pupils equal and reactive to light, neck supple, throat within normal limits Cardiovascular: Normal rate, regular rhythm and normal heart sounds.  No murmur heard. No BLE edema. Pulmonary/Chest: Effort normal and breath sounds normal. No respiratory distress. Abdominal: Soft.  There is no tenderness. Psychiatric: Patient has a normal mood and affect. behavior is normal. Judgment and thought content normal. Muscular skeletal: negative straight leg raise, normal rom of spine. No point tenderness   PHQ2/9: Depression screen [facility] 2/9 03/23/2017 08/12/2016 01/20/2016 11/05/2015 07/02/2015  Decreased Interest 0 0 0 0 0  Down, Depressed, Hopeless 0 0 0 0 0  PHQ - 2 Score 0 0 0 0 0     Fall Risk: Fall Risk  03/23/2017 08/12/2016 01/20/2016 11/05/2015 07/02/2015  Falls in the past year? No No No No No     Functional Status Survey: Is the patient deaf or have difficulty hearing?: No Does the patient have difficulty seeing, even when wearing glasses/contacts?: No Does the patient have difficulty concentrating, remembering, or making decisions?: No Does the patient have difficulty walking or climbing stairs?: No Does the patient have difficulty dressing or bathing?: No Does the patient have difficulty doing errands alone such as visiting a doctor's office or shopping?: No    Assessment & Plan   1. Benign essential HTN  - amLODipine (NORVASC) 10 MG tablet; Take 1 tablet (10 mg total) by mouth daily.  Dispense: 90 tablet; Refill: 1 - lisinopril-hydrochlorothiazide (PRINZIDE,ZESTORETIC) 20-12.5 MG tablet; Take 1 tablet by mouth daily.  Dispense: 90 tablet; Refill: 1  2. Primary osteoarthritis of both knees  - traMADol (ULTRAM) 50 MG tablet; Take 1 tablet (50 mg total) by mouth 2 (two) times daily as needed.  Dispense: 60 tablet; Refill: 2  3. Chronic right-sided low back pain with right-sided sciatica  - traMADol (ULTRAM) 50 MG tablet; Take 1 tablet (50 mg total) by mouth 2 (two) times daily as needed.  Dispense: 60 tablet; Refill: 2  4. Insomnia, unspecified type  - zolpidem (AMBIEN CR) 12.5 MG CR tablet; Take 1 tablet (12.5 mg total) by mouth at bedtime.  Dispense: 30 tablet; Refill: 2  5. Changing sleep-work schedule, affecting sleep  - zolpidem (AMBIEN CR) 12.5 MG CR tablet; Take 1 tablet (12.5 mg total) by mouth at bedtime.  Dispense: 30 tablet; Refill: 2

## 2017-06-29 ENCOUNTER — Ambulatory Visit: Payer: Medicare HMO | Admitting: Family Medicine

## 2017-07-13 ENCOUNTER — Ambulatory Visit (INDEPENDENT_AMBULATORY_CARE_PROVIDER_SITE_OTHER): Payer: BLUE CROSS/BLUE SHIELD | Admitting: Family Medicine

## 2017-07-13 ENCOUNTER — Encounter: Payer: Self-pay | Admitting: Family Medicine

## 2017-07-13 VITALS — BP 132/70 | HR 58 | Resp 16 | Ht 63.0 in | Wt 173.2 lb

## 2017-07-13 DIAGNOSIS — M5441 Lumbago with sciatica, right side: Secondary | ICD-10-CM | POA: Diagnosis not present

## 2017-07-13 DIAGNOSIS — M170 Bilateral primary osteoarthritis of knee: Secondary | ICD-10-CM | POA: Diagnosis not present

## 2017-07-13 DIAGNOSIS — L84 Corns and callosities: Secondary | ICD-10-CM

## 2017-07-13 DIAGNOSIS — I10 Essential (primary) hypertension: Secondary | ICD-10-CM | POA: Diagnosis not present

## 2017-07-13 DIAGNOSIS — G4726 Circadian rhythm sleep disorder, shift work type: Secondary | ICD-10-CM

## 2017-07-13 DIAGNOSIS — F5104 Psychophysiologic insomnia: Secondary | ICD-10-CM | POA: Diagnosis not present

## 2017-07-13 DIAGNOSIS — G8929 Other chronic pain: Secondary | ICD-10-CM

## 2017-07-13 DIAGNOSIS — E7800 Pure hypercholesterolemia, unspecified: Secondary | ICD-10-CM

## 2017-07-13 DIAGNOSIS — H6993 Unspecified Eustachian tube disorder, bilateral: Secondary | ICD-10-CM

## 2017-07-13 DIAGNOSIS — R739 Hyperglycemia, unspecified: Secondary | ICD-10-CM | POA: Diagnosis not present

## 2017-07-13 DIAGNOSIS — J301 Allergic rhinitis due to pollen: Secondary | ICD-10-CM | POA: Diagnosis not present

## 2017-07-13 DIAGNOSIS — E669 Obesity, unspecified: Secondary | ICD-10-CM | POA: Diagnosis not present

## 2017-07-13 MED ORDER — FLUTICASONE PROPIONATE 50 MCG/ACT NA SUSP: 2.0000 | Freq: Every day | NASAL | 2 refills | Status: DC

## 2017-07-13 MED ORDER — PRAVASTATIN SODIUM 40 MG PO TABS: 40.0000 mg | ORAL_TABLET | Freq: Every day | ORAL | 1 refills | Status: DC

## 2017-07-13 MED ORDER — LISINOPRIL-HYDROCHLOROTHIAZIDE 20-12.5 MG PO TABS: 1.0000 | ORAL_TABLET | Freq: Every day | ORAL | 1 refills | Status: DC

## 2017-07-13 MED ORDER — ZOLPIDEM TARTRATE ER 12.5 MG PO TBCR: 12.5000 mg | EXTENDED_RELEASE_TABLET | Freq: Every day | ORAL | 2 refills | Status: DC

## 2017-07-13 MED ORDER — AMLODIPINE BESYLATE 10 MG PO TABS: 10.0000 mg | ORAL_TABLET | Freq: Every day | ORAL | 1 refills | Status: DC

## 2017-07-13 MED ORDER — TRAMADOL HCL 50 MG PO TABS: 50.0000 mg | ORAL_TABLET | Freq: Two times a day (BID) | ORAL | 2 refills | Status: DC | PRN

## 2017-07-13 NOTE — Progress Notes (Signed)
Name: Hayley Howe   MRN: [id number]    DOB: 04/18/1946   Date:07/13/2017       Progress Note  Subjective  Chief Complaint  Chief Complaint  Patient presents with  . Hypertension  . Sore    on her left foot between her big toe.    HPI  Hyperlipidemia: taking pravastatin and denies myalgias; last Lipids 07/2016 looks excellent, no chest pain or decrease in exercise tolerance, never had TIA, CVA or heart attack, so explained that she can stop taking aspirin daily   Chronic Low Back: she has a long history of low back pain, not secondary to trauma, DDD lumbar spine, taking Tramadol daily to control symptoms(takes 1 daily, takes 2nd tablets a few times a week), she is also on Skelaxin, because flexeril caused sedation. Occasionally the pain radiates to bilateralthighs,and when radiating it is described as numbness. Pain at this time is 4/10 at this time, she did not work last night. Pain at the end ofa workday is down to 6/10, but can go higher. She goes to chiropractor( Dr. Genoveva Ill a month and is stable.  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 She sleepsfrom around 2 pm-8:30pmfor about 5-7hours when she sleeps during the day and about 5 hours during the night.Taking Ambien, denies side effects of medication, she is aware of FDA guidelines however unable to sleep with lower dose of Ambien, explained that insurance may not continue to cover the medication because of the high dose for her age. .   OA: she has aching pain on left knee, s/p knee replaced, only triggered by kneeling down, surgery was done by Dr. Rudene Christians in 2012, no recent visits with him, states back is worse than knee  Hyperglycemia: last hgbA1C was 5.8%, denies polyphagia, polydipsia or polyuria, avoiding starches.  Obesity: weight has gone up a little, she has not been walking because of the weather, but plans on resuming it soon  AR: seasonal , worse with high  pollen count, using saline spray but would like a refill of Flonase. She has occasional rhinorrhea and sometimes nasal congestion    Patient Active Problem List   Diagnosis Date Noted  . Chronic constipation 12/15/2016  . Primary osteoarthritis of left hip 11/05/2015  . Scoliosis 11/05/2015  . Chronic low back pain 10/30/2014  . Benign essential HTN 10/27/2014  . Bradycardia 10/27/2014  . Insomnia 10/27/2014  . Headache, temporal 10/27/2014  . Hypercholesteremia 10/27/2014  . Obesity (BMI 30-39.9) 10/27/2014  . Changing sleep-work schedule, affecting sleep 10/27/2014  . Vitamin D deficiency 10/27/2014  . Arthritis of knee, degenerative 09/24/2009    Past Surgical History:  Procedure Laterality Date  . ABDOMINAL HYSTERECTOMY    . BREAST BIOPSY Right 01/21/2013   stereo - benign  . COLONOSCOPY WITH PROPOFOL N/A 06/30/2016   Procedure: COLONOSCOPY WITH PROPOFOL;  Surgeon: Jonathon Bellows, MD;  Location: [facility] ENDOSCOPY;  Service: Endoscopy;  Laterality: N/A;  . KNEE SURGERY Left 2012   Dr. Rudene Christians  . TUBAL LIGATION    . VAGINAL PROLAPSE REPAIR      Family History  Problem Relation Age of Onset  . Breast cancer Sister 36       in year 2013  . Cancer Sister   . Hyperlipidemia Mother   . Diabetes Mother   . Cancer Father        Leukemia  . Hyperlipidemia Father   . Cancer Brother        Lung  .  Cancer Sister     Social History   Socioeconomic History  . Marital status: Widowed    Spouse name: Not on file  . Number of children: 2  . Years of education: Not on file  . Highest education level: 12th grade  Occupational History  . 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 Financial resource strain: Not hard at all  . Food insecurity:    Worry: Never true    Inability: Never true  . Transportation needs:    Medical: No    Non-medical: No  Tobacco Use  . Smoking status: Former Smoker    Years: 1.00    Types: Cigarettes    Start date: 08/13/1967     Last attempt to quit: 04/18/1977    Years since quitting: 40.2  . Smokeless tobacco: Never Used  . Tobacco comment: Only smoked for a year  Substance and Sexual Activity  . Alcohol use: No    Alcohol/week: 0.0 oz  . Drug use: No  . Sexual activity: Not Currently  Lifestyle  . Physical activity:    Days per week: 0 days    Minutes per session: 0 min  . Stress: Not at all  Relationships  . Social connections:    Talks on phone: More than three times a week    Gets together: Twice a week    Attends religious service: More than 4 times per year    Active member of club or organization: Yes    Attends meetings of clubs or organizations: More than 4 times per year    Relationship status: Widowed  . Intimate partner violence:    Fear of current or ex partner: No    Emotionally abused: No    Physically abused: No    Forced sexual activity: No  Other Topics Concern  . Not on file  Social History Narrative   Daughter lives with her   Still working full time at [employer]     Current Outpatient Medications:  .  amLODipine (NORVASC) 10 MG tablet, Take 1 tablet (10 mg total) by mouth daily., Disp: 90 tablet, Rfl: 1 .  Cholecalciferol (VITAMIN D) 2000 UNITS tablet, Take 1 tablet by mouth daily., Disp: , Rfl:  .  fluticasone (FLONASE) 50 MCG/ACT nasal spray, Place 2 sprays into both nostrils daily., Disp: 16 g, Rfl: 2 .  lisinopril-hydrochlorothiazide (PRINZIDE,ZESTORETIC) 20-12.5 MG tablet, Take 1 tablet by mouth daily., Disp: 90 tablet, Rfl: 1 .  loratadine (CLARITIN) 10 MG tablet, Take 1 tablet (10 mg total) by mouth daily., Disp: 30 tablet, Rfl: 5 .  Magnesium 250 MG TABS, Take 1 tablet by mouth daily., Disp: , Rfl:  .  metaxalone (SKELAXIN) 800 MG tablet, Take 1 tablet (800 mg total) by mouth 3 (three) times daily as needed for muscle spasms., Disp: 90 tablet, Rfl: 1 .  pravastatin (PRAVACHOL) 40 MG tablet, Take 1 tablet (40 mg total) by mouth daily., Disp: 90 tablet, Rfl: 1 .  traMADol  (ULTRAM) 50 MG tablet, Take 1 tablet (50 mg total) by mouth 2 (two) times daily as needed., Disp: 60 tablet, Rfl: 2 .  vitamin C (ASCORBIC ACID) 500 MG tablet, Take 1 tablet by mouth daily., Disp: , Rfl:  .  zolpidem (AMBIEN CR) 12.5 MG CR tablet, Take 1 tablet (12.5 mg total) by mouth at bedtime., Disp: 30 tablet, Rfl: 2  No Known Allergies   ROS  Constitutional: Negative for fever or weight change.  Respiratory: Negative for cough and shortness of breath.   Cardiovascular: Negative for chest pain or palpitations.  Gastrointestinal: Negative for abdominal pain, no bowel changes.  Musculoskeletal: Negative for gait problem or joint swelling.  Skin: Negative for rash.  Neurological: Negative for dizziness or headache.  No other specific complaints in a complete review of systems (except as listed in HPI above).  Objective  Vitals:   07/13/17 0746 07/13/17 0755  BP: (!) 144/70 132/70  Pulse: (!) 58   Resp: 16   SpO2: 96%   Weight: 173 lb 3.2 oz (78.6 kg)   Height: 5\' 3"  (1.6 m)     Body mass index is 30.68 kg/m.  Physical Exam  Constitutional: Patient appears well-developed and well-nourished. Obese  No distress.  HEENT: head atraumatic, normocephalic, pupils equal and reactive to light, neck supple, throat within normal limits Cardiovascular: Normal rate, regular rhythm and normal heart sounds.  No murmur heard. No BLE edema. Pulmonary/Chest: Effort normal and breath sounds normal. No respiratory distress. Abdominal: Soft.  There is no tenderness. Psychiatric: Patient has a normal mood and affect. behavior is normal. Judgment and thought content normal. Skin: corn on 2nd medial left toe and a flap of skin from ruptured blister on lateral 1st toe left foot Muscular Skeletal: thoracic scoliosis no pain during palpation, negative straight leg raise  PHQ2/9: Depression screen [facility] 2/9 03/23/2017 08/12/2016 01/20/2016 11/05/2015 07/02/2015  Decreased Interest 0 0 0 0 0  Down,  Depressed, Hopeless 0 0 0 0 0  PHQ - 2 Score 0 0 0 0 0    Fall Risk: Fall Risk  07/13/2017 03/23/2017 08/12/2016 01/20/2016 11/05/2015  Falls in the past year? No No No No No     Functional Status Survey: Is the patient deaf or have difficulty hearing?: No Does the patient have difficulty seeing, even when wearing glasses/contacts?: No Does the patient have difficulty concentrating, remembering, or making decisions?: No Does the patient have difficulty walking or climbing stairs?: No Does the patient have difficulty dressing or bathing?: No Does the patient have difficulty doing errands alone such as visiting a doctor's office or shopping?: No    Assessment & Plan  1. Benign essential HTN  - amLODipine (NORVASC) 10 MG tablet; Take 1 tablet (10 mg total) by mouth daily.  Dispense: 90 tablet; Refill: 1 - lisinopril-hydrochlorothiazide (PRINZIDE,ZESTORETIC) 20-12.5 MG tablet; Take 1 tablet by mouth daily.  Dispense: 90 tablet; Refill: 1 - COMPLETE METABOLIC PANEL WITH GFR - CBC with Differential/Platelet  2. Eustachian tube disorder, bilateral  - fluticasone (FLONASE) 50 MCG/ACT nasal spray; Place 2 sprays into both nostrils daily.  Dispense: 16 g; Refill: 2  3. Seasonal allergic rhinitis  - fluticasone (FLONASE) 50 MCG/ACT nasal spray; Place 2 sprays into both nostrils daily.  Dispense: 16 g; Refill: 2  4. Hypercholesteremia  - pravastatin (PRAVACHOL) 40 MG tablet; Take 1 tablet (40 mg total) by mouth daily.  Dispense: 90 tablet; Refill: 1 - Lipid panel  5. Primary osteoarthritis of both knees  - traMADol (ULTRAM) 50 MG tablet; Take 1 tablet (50 mg total) by mouth 2 (two) times daily as needed.  Dispense: 60 tablet; Refill: 2  6. Chronic right-sided low back pain with right-sided sciatica  - traMADol (ULTRAM) 50 MG tablet; Take 1 tablet (50 mg total) by mouth 2 (two) times daily as needed.  Dispense: 60 tablet; Refill: 2  7. Hyperglycemia  - Hemoglobin A1c  8. Obesity  (BMI 30-39.9)  Discussed with the patient the risk  posed by an increased BMI. Discussed importance of portion control, calorie counting and at least 150 minutes of physical activity weekly. Avoid sweet beverages and drink more water. Eat at least 6 servings of fruit and vegetables daily   9. Insomnia, unspecified type  - zolpidem (AMBIEN CR) 12.5 MG CR tablet; Take 1 tablet (12.5 mg total) by mouth at bedtime.  Dispense: 30 tablet; Refill: 2  10. Changing sleep-work schedule, affecting sleep  - zolpidem (AMBIEN CR) 12.5 MG CR tablet; Take 1 tablet (12.5 mg total) by mouth at bedtime.  Dispense: 30 tablet; Refill: 2   11. Corn of toe  Of the left second medial toe, causing friction and a blister on left lateral big toe, advised vaseline and try using corn pads on the corn to avoid recurrence, if no improvement call back for referral to podiatrist

## 2017-07-14 LAB — CBC WITH DIFFERENTIAL/PLATELET
BASOS ABS: 60 {cells}/uL (ref 0–200)
Basophils Relative: 1.5 %
EOS ABS: 60 {cells}/uL (ref 15–500)
Eosinophils Relative: 1.5 %
HCT: 32.6 % — ABNORMAL LOW (ref 35.0–45.0)
Hemoglobin: 11 g/dL — ABNORMAL LOW (ref 11.7–15.5)
Lymphs Abs: 1812 cells/uL (ref 850–3900)
MCH: 27.4 pg (ref 27.0–33.0)
MCHC: 33.7 g/dL (ref 32.0–36.0)
MCV: 81.3 fL (ref 80.0–100.0)
MONOS PCT: 7.8 %
MPV: 10.4 fL (ref 7.5–12.5)
NEUTROS PCT: 43.9 %
Neutro Abs: 1756 cells/uL (ref 1500–7800)
PLATELETS: 289 10*3/uL (ref 140–400)
RBC: 4.01 10*6/uL (ref 3.80–5.10)
RDW: 13.8 % (ref 11.0–15.0)
TOTAL LYMPHOCYTE: 45.3 %
WBC mixed population: 312 cells/uL (ref 200–950)
WBC: 4 10*3/uL (ref 3.8–10.8)

## 2017-07-14 LAB — LIPID PANEL
CHOL/HDL RATIO: 2.4 (calc) (ref <5.0)
CHOLESTEROL: 178 mg/dL (ref <200)
HDL: 74 mg/dL (ref >50)
LDL CHOLESTEROL (CALC): 90 mg/dL
Non-HDL Cholesterol (Calc): 104 mg/dL (calc) (ref <130)
Triglycerides: 46 mg/dL (ref <150)

## 2017-07-14 LAB — IRON,TIBC AND FERRITIN PANEL
%SAT: 56 % — AB (ref 11–50)
FERRITIN: 99 ng/mL (ref 20–288)
Iron: 154 ug/dL (ref 45–160)
TIBC: 273 mcg/dL (calc) (ref 250–450)

## 2017-07-14 LAB — COMPLETE METABOLIC PANEL WITH GFR
AG Ratio: 1.3 (calc) (ref 1.0–2.5)
ALKALINE PHOSPHATASE (APISO): 51 U/L (ref 33–130)
ALT: 17 U/L (ref 6–29)
AST: 22 U/L (ref 10–35)
Albumin: 3.9 g/dL (ref 3.6–5.1)
BUN: 19 mg/dL (ref 7–25)
CO2: 28 mmol/L (ref 20–32)
CREATININE: 0.85 mg/dL (ref 0.50–0.99)
Calcium: 9.4 mg/dL (ref 8.6–10.4)
Chloride: 103 mmol/L (ref 98–110)
GFR, Est African American: 81 mL/min/{1.73_m2} (ref >=60)
GFR, Est Non African American: 70 mL/min/{1.73_m2} (ref >=60)
GLUCOSE: 84 mg/dL (ref 65–99)
Globulin: 3.1 g/dL (calc) (ref 1.9–3.7)
Potassium: 4.3 mmol/L (ref 3.5–5.3)
Sodium: 138 mmol/L (ref 135–146)
Total Bilirubin: 0.4 mg/dL (ref 0.2–1.2)
Total Protein: 7 g/dL (ref 6.1–8.1)

## 2017-07-14 LAB — TEST AUTHORIZATION

## 2017-07-14 LAB — HEMOGLOBIN A1C
EAG (MMOL/L): 7 (calc)
Hgb A1c MFr Bld: 6 % of total Hgb — ABNORMAL HIGH (ref <5.7)
Mean Plasma Glucose: 126 (calc)

## 2017-12-15 ENCOUNTER — Other Ambulatory Visit: Payer: Self-pay | Admitting: Family Medicine

## 2017-12-25 ENCOUNTER — Other Ambulatory Visit: Payer: Self-pay | Admitting: Family Medicine

## 2017-12-25 DIAGNOSIS — G4726 Circadian rhythm sleep disorder, shift work type: Secondary | ICD-10-CM

## 2017-12-25 DIAGNOSIS — F5104 Psychophysiologic insomnia: Secondary | ICD-10-CM

## 2017-12-29 ENCOUNTER — Other Ambulatory Visit: Payer: Self-pay | Admitting: Family Medicine

## 2017-12-29 DIAGNOSIS — G4726 Circadian rhythm sleep disorder, shift work type: Secondary | ICD-10-CM

## 2017-12-29 DIAGNOSIS — F5104 Psychophysiologic insomnia: Secondary | ICD-10-CM

## 2018-01-19 ENCOUNTER — Ambulatory Visit (INDEPENDENT_AMBULATORY_CARE_PROVIDER_SITE_OTHER): Payer: BLUE CROSS/BLUE SHIELD

## 2018-01-19 ENCOUNTER — Ambulatory Visit (INDEPENDENT_AMBULATORY_CARE_PROVIDER_SITE_OTHER): Payer: BLUE CROSS/BLUE SHIELD | Admitting: Family Medicine

## 2018-01-19 ENCOUNTER — Encounter: Payer: Self-pay | Admitting: Family Medicine

## 2018-01-19 VITALS — BP 130/80 | HR 70 | Temp 98.1°F | Resp 16 | Ht 63.0 in | Wt 172.1 lb

## 2018-01-19 VITALS — BP 130/80 | HR 70 | Temp 98.1°F | Resp 16 | Ht 63.0 in | Wt 172.0 lb

## 2018-01-19 DIAGNOSIS — I10 Essential (primary) hypertension: Secondary | ICD-10-CM

## 2018-01-19 DIAGNOSIS — Z0000 Encounter for general adult medical examination without abnormal findings: Secondary | ICD-10-CM | POA: Diagnosis not present

## 2018-01-19 DIAGNOSIS — G8929 Other chronic pain: Secondary | ICD-10-CM

## 2018-01-19 DIAGNOSIS — D649 Anemia, unspecified: Secondary | ICD-10-CM

## 2018-01-19 DIAGNOSIS — Z1239 Encounter for other screening for malignant neoplasm of breast: Secondary | ICD-10-CM

## 2018-01-19 DIAGNOSIS — H6993 Unspecified Eustachian tube disorder, bilateral: Secondary | ICD-10-CM | POA: Diagnosis not present

## 2018-01-19 DIAGNOSIS — Z23 Encounter for immunization: Secondary | ICD-10-CM

## 2018-01-19 DIAGNOSIS — M170 Bilateral primary osteoarthritis of knee: Secondary | ICD-10-CM

## 2018-01-19 DIAGNOSIS — E7800 Pure hypercholesterolemia, unspecified: Secondary | ICD-10-CM

## 2018-01-19 DIAGNOSIS — G4726 Circadian rhythm sleep disorder, shift work type: Secondary | ICD-10-CM

## 2018-01-19 DIAGNOSIS — J301 Allergic rhinitis due to pollen: Secondary | ICD-10-CM | POA: Diagnosis not present

## 2018-01-19 DIAGNOSIS — E2839 Other primary ovarian failure: Secondary | ICD-10-CM

## 2018-01-19 DIAGNOSIS — F5104 Psychophysiologic insomnia: Secondary | ICD-10-CM

## 2018-01-19 DIAGNOSIS — R739 Hyperglycemia, unspecified: Secondary | ICD-10-CM

## 2018-01-19 DIAGNOSIS — M5441 Lumbago with sciatica, right side: Secondary | ICD-10-CM

## 2018-01-19 MED ORDER — TRAMADOL HCL 50 MG PO TABS: 50.0000 mg | ORAL_TABLET | Freq: Two times a day (BID) | ORAL | 2 refills | Status: DC | PRN

## 2018-01-19 MED ORDER — LISINOPRIL-HYDROCHLOROTHIAZIDE 20-12.5 MG PO TABS: 1.0000 | ORAL_TABLET | Freq: Every day | ORAL | 1 refills | Status: DC

## 2018-01-19 MED ORDER — ASPIRIN EC 81 MG PO TBEC: 81.0000 mg | DELAYED_RELEASE_TABLET | Freq: Every day | ORAL | 0 refills | Status: AC

## 2018-01-19 MED ORDER — ZOLPIDEM TARTRATE ER 12.5 MG PO TBCR: 12.5000 mg | EXTENDED_RELEASE_TABLET | Freq: Every day | ORAL | 3 refills | Status: DC

## 2018-01-19 MED ORDER — PRAVASTATIN SODIUM 40 MG PO TABS: 40.0000 mg | ORAL_TABLET | Freq: Every day | ORAL | 1 refills | Status: DC

## 2018-01-19 MED ORDER — AMLODIPINE BESYLATE 10 MG PO TABS: 10.0000 mg | ORAL_TABLET | Freq: Every day | ORAL | 1 refills | Status: DC

## 2018-01-19 MED ORDER — FLUTICASONE PROPIONATE 50 MCG/ACT NA SUSP: 2.0000 | Freq: Every day | NASAL | 2 refills | Status: DC

## 2018-01-19 NOTE — Progress Notes (Signed)
Name: Hayley Howe   MRN: [id number]    DOB: 1945/08/13   Date:01/19/2018       Progress Note  Subjective  Chief Complaint  Chief Complaint  Patient presents with  . Follow-up    HPI  Hyperlipidemia: taking pravastatin and denies myalgias; last Lipids 06/2017 and LDL was 90,no chest pain or decrease in exercise tolerance. Advised to resume Aspirin 81 mg daily because ASCVD risk 11.4% but risk of bleeding is only 2.3%  Chronic Low Back: she has a long history of low back pain, not secondary to trauma, DDD lumbar spine and also scoliosis. taking Tramadol daily to control symptoms(takes 1 daily, takes 2nd tablets a few times a week), she is also on Skelaxin, because flexeril caused sedation. Pain is constant but worse after work. . Occasionally the pain radiates to bilateralthighs,and when radiating it is described as numbness. Pain at this time is 4/10 , she did not work last night. She goes to chiropractor( Dr. Genoveva Ill a month and is stable.  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 She sleepsfrom around2pm-8:30pmfor about 5-7hours when she sleeps during the day and about 5 hours during the night.Taking Ambien, denies side effects of medication, she is aware of FDA guidelines however unable to sleep with lower dose of Ambien.   OA: she has aching pain on left knee, s/p knee replaced, only triggered by kneeling down, surgery was done by Dr. Rudene Christians in 2012, no recent visits with him, states back is worse than knee and pain is stable, advised Tylenol and avoid nsdais  Hyperglycemia: last hgbA1C was 5.8%, denies polyphagia, polydipsia or polyuria, avoiding starches. We will recheck hgbA1C today   Obesity: weight is down one pound, discussed life style modification  AR: seasonal ,doing well at this time. She has occasional rhinorrhea and sometimes nasal congestion   Patient Active Problem List   Diagnosis Date Noted  .  Hyperglycemia 07/13/2017  . Chronic constipation 12/15/2016  . Primary osteoarthritis of left hip 11/05/2015  . Scoliosis 11/05/2015  . Chronic low back pain 10/30/2014  . Benign essential HTN 10/27/2014  . Bradycardia 10/27/2014  . Insomnia 10/27/2014  . Headache, temporal 10/27/2014  . Hypercholesteremia 10/27/2014  . Obesity (BMI 30-39.9) 10/27/2014  . Changing sleep-work schedule, affecting sleep 10/27/2014  . Vitamin D deficiency 10/27/2014  . Arthritis of knee, degenerative 09/24/2009    Past Surgical History:  Procedure Laterality Date  . ABDOMINAL HYSTERECTOMY    . BREAST BIOPSY Right 01/21/2013   stereo - benign  . COLONOSCOPY WITH PROPOFOL N/A 06/30/2016   Procedure: COLONOSCOPY WITH PROPOFOL;  Surgeon: Jonathon Bellows, MD;  Location: [facility] ENDOSCOPY;  Service: Endoscopy;  Laterality: N/A;  . KNEE SURGERY Left 2012   Dr. Rudene Christians  . TUBAL LIGATION    . VAGINAL PROLAPSE REPAIR      Family History  Problem Relation Age of Onset  . Breast cancer Sister 39       in year 2013  . Cancer Sister   . Hyperlipidemia Mother   . Diabetes Mother   . Cancer Father        Leukemia  . Hyperlipidemia Father   . Cancer Brother        Lung  . Cancer Sister     Social History   Socioeconomic History  . Marital status: Widowed    Spouse name: Not on file  . Number of children: 2  . Years of education: Not on file  .  Highest education level: 12th grade  Occupational History  . Occupation: Education officer, museum: [place]  . Financial resource strain: Not hard at all  . Food insecurity:    Worry: Never true    Inability: Never true  . Transportation needs:    Medical: No    Non-medical: No  Tobacco Use  . Smoking status: Former Smoker    Years: 1.00    Types: Cigarettes    Start date: 08/13/1967    Last attempt to quit: 04/18/1977    Years since quitting: 40.7  . Smokeless tobacco: Never Used  . Tobacco comment: smoking cessation materials not required   Substance and Sexual Activity  . Alcohol use: No    Alcohol/week: 0.0 standard drinks  . Drug use: No  . Sexual activity: Not Currently  Lifestyle  . Physical activity:    Days per week: 0 days    Minutes per session: 0 min  . Stress: Not at all  Relationships  . Social connections:    Talks on phone: More than three times a week    Gets together: Twice a week    Attends religious service: More than 4 times per year    Active member of club or organization: Yes    Attends meetings of clubs or organizations: More than 4 times per year    Relationship status: Widowed  . Intimate partner violence:    Fear of current or ex partner: No    Emotionally abused: No    Physically abused: No    Forced sexual activity: No  Other Topics Concern  . Not on file  Social History Narrative   Daughter lives with her   Still working full time at [employer]     Current Outpatient Medications:  .  amLODipine (NORVASC) 10 MG tablet, Take 1 tablet (10 mg total) by mouth daily., Disp: 90 tablet, Rfl: 1 .  Cholecalciferol (VITAMIN D) 2000 UNITS tablet, Take 1 tablet by mouth daily., Disp: , Rfl:  .  fluticasone (FLONASE) 50 MCG/ACT nasal spray, Place 2 sprays into both nostrils daily., Disp: 16 g, Rfl: 2 .  lisinopril-hydrochlorothiazide (PRINZIDE,ZESTORETIC) 20-12.5 MG tablet, Take 1 tablet by mouth daily., Disp: 90 tablet, Rfl: 1 .  loratadine (CLARITIN) 10 MG tablet, Take 1 tablet (10 mg total) by mouth daily., Disp: 30 tablet, Rfl: 5 .  Magnesium 250 MG TABS, Take 1 tablet by mouth daily., Disp: , Rfl:  .  metaxalone (SKELAXIN) 800 MG tablet, Take 1 tablet (800 mg total) by mouth 3 (three) times daily as needed for muscle spasms., Disp: 90 tablet, Rfl: 1 .  pravastatin (PRAVACHOL) 40 MG tablet, Take 1 tablet (40 mg total) by mouth daily., Disp: 90 tablet, Rfl: 1 .  traMADol (ULTRAM) 50 MG tablet, Take 1 tablet (50 mg total) by mouth 2 (two) times daily as needed., Disp: 60 tablet, Rfl: 2 .  vitamin C  (ASCORBIC ACID) 500 MG tablet, Take 1 tablet by mouth daily., Disp: , Rfl:  .  zolpidem (AMBIEN CR) 12.5 MG CR tablet, Take 1 tablet (12.5 mg total) by mouth at bedtime., Disp: 30 tablet, Rfl: 3  No Known Allergies  I personally reviewed active problem list, medication list, allergies, family history, social history with the patient/caregiver today.   ROS  Constitutional: Negative for fever or weight change.  Respiratory: Negative for cough and shortness of breath.   Cardiovascular: Negative for chest pain or palpitations.  Gastrointestinal: Negative for abdominal  pain, no bowel changes.  Musculoskeletal: Negative for gait problem or joint swelling.  Skin: Negative for rash.  Neurological: Negative for dizziness or headache.  No other specific complaints in a complete review of systems (except as listed in HPI above).  Objective  Vitals:   01/19/18 0817  BP: 130/80  Pulse: 70  Resp: 16  Temp: 98.1 F (36.7 C)  TempSrc: Oral  SpO2: 99%  Weight: 172 lb 1.6 oz (78.1 kg)  Height: 5\' 3"  (1.6 m)    Body mass index is 30.49 kg/m.  Physical Exam  Constitutional: Patient appears well-developed and well-nourished. Obese No distress.  HEENT: head atraumatic, normocephalic, pupils equal and reactive to light,  neck supple, throat within normal limits Cardiovascular: Normal rate, regular rhythm and normal heart sounds.  No murmur heard. No BLE edema. Pulmonary/Chest: Effort normal and breath sounds normal. No respiratory distress. Abdominal: Soft.  There is no tenderness. Psychiatric: Patient has a normal mood and affect. behavior is normal. Judgment and thought content normal. Muscular skeletal: crepitus with extension of both  knees, pain during palpation of right lower back, negative straight leg raise   PHQ2/9: Depression screen [facility] 2/9 01/19/2018 03/23/2017 08/12/2016 01/20/2016 11/05/2015  Decreased Interest 0 0 0 0 0  Down, Depressed, Hopeless 0 0 0 0 0  PHQ - 2 Score 0 0 0 0  0     Fall Risk: Fall Risk  01/19/2018 07/13/2017 03/23/2017 08/12/2016 01/20/2016  Falls in the past year? No No No No No    Assessment & Plan   1. Benign essential HTN  - amLODipine (NORVASC) 10 MG tablet; Take 1 tablet (10 mg total) by mouth daily.  Dispense: 90 tablet; Refill: 1 - lisinopril-hydrochlorothiazide (PRINZIDE,ZESTORETIC) 20-12.5 MG tablet; Take 1 tablet by mouth daily.  Dispense: 90 tablet; Refill: 1 - CBC with Differential/Platelet - COMPLETE METABOLIC PANEL WITH GFR - Vitamin B12 - Iron, TIBC and Ferritin Panel  2. Flu vaccine need  - Flu vaccine HIGH DOSE PF  3. Eustachian tube disorder, bilateral  - fluticasone (FLONASE) 50 MCG/ACT nasal spray; Place 2 sprays into both nostrils daily.  Dispense: 16 g; Refill: 2  4. Seasonal allergic rhinitis due to pollen  - fluticasone (FLONASE) 50 MCG/ACT nasal spray; Place 2 sprays into both nostrils daily.  Dispense: 16 g; Refill: 2  5. Hypercholesteremia  - pravastatin (PRAVACHOL) 40 MG tablet; Take 1 tablet (40 mg total) by mouth daily.  Dispense: 90 tablet; Refill: 1  6. Primary osteoarthritis of both knees  - traMADol (ULTRAM) 50 MG tablet; Take 1 tablet (50 mg total) by mouth 2 (two) times daily as needed.  Dispense: 60 tablet; Refill: 2  7. Chronic right-sided low back pain with right-sided sciatica  - traMADol (ULTRAM) 50 MG tablet; Take 1 tablet (50 mg total) by mouth 2 (two) times daily as needed.  Dispense: 60 tablet; Refill: 2  8. Chronic insomnia  - zolpidem (AMBIEN CR) 12.5 MG CR tablet; Take 1 tablet (12.5 mg total) by mouth at bedtime.  Dispense: 30 tablet; Refill: 3  9. Changing sleep-work schedule, affecting sleep  - zolpidem (AMBIEN CR) 12.5 MG CR tablet; Take 1 tablet (12.5 mg total) by mouth at bedtime.  Dispense: 30 tablet; Refill: 3  10. Hyperglycemia  - Hemoglobin A1c  11. Anemia, unspecified type  - CBC with Differential/Platelet

## 2018-01-19 NOTE — Progress Notes (Signed)
Subjective:   Hayley Howe is a 72 y.o. female who presents for an Initial Medicare Annual Wellness Visit.  Review of Systems    N/A  Cardiac Risk Factors include: advanced age (>94men, >1 women);dyslipidemia;hypertension;obesity (BMI >30kg/m2);sedentary lifestyle     Objective:    Today's Vitals   01/19/18 0921  BP: 130/80  Pulse: 70  Resp: 16  Temp: 98.1 F (36.7 C)  TempSrc: Oral  SpO2: 99%  Weight: 172 lb (78 kg)  Height: 5\' 3"  (1.6 m)   Body mass index is 30.47 kg/m.  Advanced Directives 01/19/2018 12/15/2016 08/12/2016 06/30/2016 06/02/2016 01/20/2016 11/05/2015  Does Patient Have a Medical Advance Directive? Yes No Yes Yes Yes Yes Yes  Type of Paramedic of [place];Living will - [place];Living will Living will;Healthcare Power of [place];Living will [place];Living will [place];Living will  Does patient want to make changes to medical advance directive? - - - - - No - Patient declined No - Patient declined  Copy of [place] in Chart? No - copy requested - No - copy requested No - copy requested - No - copy requested No - copy requested    Current Medications (verified) Outpatient Encounter Medications as of 01/19/2018  Medication Sig  . amLODipine (NORVASC) 10 MG tablet Take 1 tablet (10 mg total) by mouth daily.  Marland Kitchen aspirin EC 81 MG tablet Take 1 tablet (81 mg total) by mouth daily.  . Cholecalciferol (VITAMIN D) 2000 UNITS tablet Take 1 tablet by mouth daily.  . fluticasone (FLONASE) 50 MCG/ACT nasal spray Place 2 sprays into both nostrils daily.  Marland Kitchen lisinopril-hydrochlorothiazide (PRINZIDE,ZESTORETIC) 20-12.5 MG tablet Take 1 tablet by mouth daily.  Marland Kitchen loratadine (CLARITIN) 10 MG tablet Take 1 tablet (10 mg total) by mouth daily.  . Magnesium 250 MG TABS Take 1 tablet by mouth daily.  . metaxalone (SKELAXIN) 800 MG tablet Take 1  tablet (800 mg total) by mouth 3 (three) times daily as needed for muscle spasms.  . pravastatin (PRAVACHOL) 40 MG tablet Take 1 tablet (40 mg total) by mouth daily.  . traMADol (ULTRAM) 50 MG tablet Take 1 tablet (50 mg total) by mouth 2 (two) times daily as needed.  . vitamin C (ASCORBIC ACID) 500 MG tablet Take 1 tablet by mouth daily.  Marland Kitchen zolpidem (AMBIEN CR) 12.5 MG CR tablet Take 1 tablet (12.5 mg total) by mouth at bedtime.   No facility-administered encounter medications on file as of 01/19/2018.     Allergies (verified) Patient has no known allergies.   History: Past Medical History:  Diagnosis Date  . Hyperlipidemia   . Hypertension   . Insomnia   . Low back pain   . Obesity   . Osteoarthritis of left knee    Past Surgical History:  Procedure Laterality Date  . ABDOMINAL HYSTERECTOMY    . BREAST BIOPSY Right 01/21/2013   stereo - benign  . COLONOSCOPY WITH PROPOFOL N/A 06/30/2016   Procedure: COLONOSCOPY WITH PROPOFOL;  Surgeon: Jonathon Bellows, MD;  Location: [facility] ENDOSCOPY;  Service: Endoscopy;  Laterality: N/A;  . KNEE SURGERY Left 2012   Dr. Rudene Christians  . TUBAL LIGATION    . VAGINAL PROLAPSE REPAIR     Family History  Problem Relation Age of Onset  . Breast cancer Sister 19       in year 2013  . Cancer Sister   . Hyperlipidemia Mother   .  Diabetes Mother   . Cancer Father        Leukemia  . Hyperlipidemia Father   . Cancer Brother        Lung  . Cancer Sister    Social History   Socioeconomic History  . Marital status: Widowed    Spouse name: Not on file  . Number of children: 2  . Years of education: Not on file  . Highest education level: 12th grade  Occupational History  . Occupation: Education officer, museum: [employer]  . Financial resource strain: Not hard at all  . Food insecurity:    Worry: Never true    Inability: Never true  . Transportation needs:    Medical: No    Non-medical: No  Tobacco Use  . Smoking status: Former Smoker     Packs/day: 0.25    Years: 1.00    Pack years: 0.25    Types: Cigarettes    Start date: 08/13/1967    Last attempt to quit: 04/18/1977    Years since quitting: 40.7  . Smokeless tobacco: Never Used  . Tobacco comment: smoking cessation materials not required  Substance and Sexual Activity  . Alcohol use: No    Alcohol/week: 0.0 standard drinks  . Drug use: No  . Sexual activity: Not Currently  Lifestyle  . Physical activity:    Days per week: 0 days    Minutes per session: 0 min  . Stress: Not at all  Relationships  . Social connections:    Talks on phone: More than three times a week    Gets together: Twice a week    Attends religious service: More than 4 times per year    Active member of club or organization: Yes    Attends meetings of clubs or organizations: More than 4 times per year    Relationship status: Widowed  Other Topics Concern  . Not on file  Social History Narrative   Daughter lives with her   Still working full time at [employer] [place] given: No Comment: smoking cessation materials not required  Clinical Intake:  Pre-visit preparation completed: Yes  Pain : No/denies pain   BMI - recorded: 30.47 Nutritional Status: BMI > 30  Obese Nutritional Risks: None Diabetes: No  How often do you need to have someone help you when you read instructions, pamphlets, or other written materials from your doctor or pharmacy?: 1 - Never  Interpreter Needed?: No  Information entered by :: AE[place]sole, LPN   Activities of Daily Living In your present state of health, do you have any difficulty performing the following activities: 01/19/2018 07/13/2017  Hearing? N N  Comment denies hearing aids -  Vision? N N  Comment wears eyeglasses -  Difficulty concentrating or making decisions? N N  Walking or climbing stairs? N N  Dressing or bathing? N N  Doing errands, shopping? N N  Preparing Food and eating ? N -  Comment denies dentures -  Using  the Toilet? N -  In the past six months, have you accidently leaked urine? N -  Do you have problems with loss of bowel control? N -  Managing your Medications? N -  Managing your Finances? N -  Housekeeping or managing your Housekeeping? N -  Some recent data might be hidden     Immunizations and Health Maintenance Immunization History  Administered Date(s) Administered  . Influenza, High Dose Seasonal PF 01/20/2016,  12/15/2016, 01/19/2018  . Influenza, Seasonal, Injecte, Preservative Fre 02/18/2010, 02/11/2011, 03/29/2012  . Influenza,inj,Quad PF,6+ Mos 01/03/2013, 01/10/2014, 03/05/2015  . Influenza-Unspecified 12/17/2013  . Pneumococcal Conjugate-13 06/19/2014  . Pneumococcal Polysaccharide-23 02/18/2010, 07/02/2015  . Td 08/12/2016  . Tdap 09/14/2007  . Zoster 08/20/2015   Health Maintenance Due  Topic Date Due  . MAMMOGRAM  06/11/2016    Patient Care Team: Steele Sizer, MD as PCP - General (Family Medicine) Grant Fontana, [place] as Consulting Physician (Chiropractic Medicine)  Indicate any recent Medical Services you may have received from other than [facility] providers in the past year (date may be approximate).     Assessment:   This is a routine wellness examination for Hayley Howe.  Hearing/Vision screen Vision Screening Comments: Sees [facility] for annual eye exams  Dietary issues and exercise activities discussed: Current Exercise Habits: The patient does not participate in regular exercise at present, Exercise limited by: None identified  Goals    . DIET - REDUCE FAT INTAKE     Recommend to eat 3 small healthy meals and at least 2 healthy snacks per day.       Depression Screen PHQ 2/9 Scores 01/19/2018 01/19/2018 03/23/2017 08/12/2016 01/20/2016 11/05/2015 07/02/2015  PHQ - 2 Score 0 0 0 0 0 0 0  PHQ- 9 Score 0 - - - - - -    Fall Risk Fall Risk  01/19/2018 01/19/2018 07/13/2017 03/23/2017 08/12/2016  Falls in the past year? No No No No No  Risk for fall due to :  Impaired vision - - - -  Risk for fall due to: Comment wears eyeglasses - - - -    FALL RISK PREVENTION PERTAINING TO THE HOME:  Any stairs in or around the home WITH handrails? Yes  Home free of loose throw rugs in walkways, pet beds, electrical cords, etc? Yes  Adequate lighting in your home to reduce risk of falls? Yes   ASSISTIVE DEVICES UTILIZED TO PREVENT FALLS:  Life alert? No  Use of a cane, walker or w/c? No  Grab bars in the bathroom? No  Shower chair or bench in shower? No  Elevated toilet seat or a handicapped toilet? No   DME ORDERS:  DME order needed?  No   TIMED UP AND GO:  Was the test performed? Yes .  Length of time to ambulate 10 feet: 6 sec.   GAIT:  Appearance of gait: Gait stead-fast and without the use of an assistive device.  Education: Fall risk prevention has been discussed.  Intervention(s) required? No   Cognitive Function:     6CIT Screen 01/19/2018  What Year? 0 points  What month? 0 points  What time? 0 points  Count back from 20 0 points  Months in reverse 2 points  Repeat phrase 4 points  Total Score 6    Screening Tests Health Maintenance  Topic Date Due  . MAMMOGRAM  06/11/2016  . COLONOSCOPY  06/30/2021  . TETANUS/TDAP  08/13/2026  . INFLUENZA VACCINE  Completed  . DEXA SCAN  Completed  . Hepatitis C Screening  Completed  . PNA vac Low Risk Adult  Completed    Qualifies for Shingles Vaccine? Yes  Zostavax completed 08/20/15. Due for Shingrix. Education has been provided regarding the importance of this vaccine. Pt has been advised to call insurance company to determine out of pocket expense. Advised may also receive vaccine at local pharmacy or Health Dept. Verbalized acceptance and understanding.  Cancer Screenings:  Colorectal Screening: Completed  06/30/16. Repeat every 5 years  Mammogram: Completed 06/12/15. Repeat every year. Ordered today. Pt provided with contact info and advised to call to schedule appt.   Bone  Density: Completed 09/13/12. Results reflect NORMAL. Repeat every 2 years. Ordered today. Pt provided with contact info and advised to call to schedule appt.   Lung Cancer Screening: (Low Dose CT Chest recommended if Age 9-80 years, 30 pack-year currently smoking OR have quit w/in 15years.) does not qualify.   Additional Screening:  Hepatitis C Screening: Completed 03/29/12  Vision Screening: Recommended annual ophthalmology exams for early detection of glaucoma and other disorders of the eye. Is the patient up to date with their annual eye exam?  No  Who is the provider or what is the name of the office in which the pt attends annual eye exams? [facility] Would pt like to be referred to her provider for an updated eye exam? No . States she would prefer to schedule her own appt.  Dental Screening: Recommended annual dental exams for proper oral hygiene  Community Resource Referral:  CRR required this visit?  No    Plan:  I have personally reviewed and addressed the Medicare Annual Wellness questionnaire and have noted the following in the patient's chart:  A. Medical and social history B. Use of alcohol, tobacco or illicit drugs  C. Current medications and supplements D. Functional ability and status E.  Nutritional status F.  Physical activity G. Advance directives H. List of other physicians I.  Hospitalizations, surgeries, and ER visits in previous 12 months J.  [place] such as hearing and vision if needed, cognitive and depression L. Referrals and appointments  In addition, I have reviewed and discussed with patient certain preventive protocols, quality metrics, and best practice recommendations. A written personalized care plan for preventive services as well as general preventive health recommendations were provided to patient.  See attached scanned questionnaire for additional information.   Signed,  Aleatha Borer, LPN Nurse Health Advisor

## 2018-01-19 NOTE — Patient Instructions (Signed)
Hayley Howe , Thank you for taking time to come for your Medicare Wellness Visit. I appreciate your ongoing commitment to your health goals. Please review the following plan we discussed and let me know if I can assist you in the future.   Screening recommendations/referrals: Colorectal Screening: Up to date Mammogram: Please call to schedule your appointment Bone Density: Please call to schedule your appointment  Vision and Dental Exams: Recommended annual ophthalmology exams for early detection of glaucoma and other disorders of the eye Recommended annual dental exams for proper oral hygiene  Vaccinations: Influenza vaccine: Up to date Pneumococcal vaccine: Up to date Tdap vaccine: Up to date Shingles vaccine: Please call your insurance company to determine your out of pocket expense for the Shingrix vaccine. You may receive this vaccine at your local pharmacy.  Advanced directives: Please bring a copy of your POA (Power of Attorney) and/or Living Will to your next appointment.  Goals: Recommend to eat 3 small healthy meals and at least 2 healthy snacks per day.  Next appointment: Please schedule your Annual Wellness Visit with your Nurse Health Advisor in one year.  Preventive Care 18 Years and Older, Female Preventive care refers to lifestyle choices and visits with your health care provider that can promote health and wellness. What does preventive care include?  A yearly physical exam. This is also called an annual well check.  Dental exams once or twice a year.  Routine eye exams. Ask your health care provider how often you should have your eyes checked.  Personal lifestyle choices, including:  Daily care of your teeth and gums.  Regular physical activity.  Eating a healthy diet.  Avoiding tobacco and drug use.  Limiting alcohol use.  Practicing safe sex.  Taking low-dose aspirin every day if recommended by your health care provider.  Taking vitamin and mineral  supplements as recommended by your health care provider. What happens during an annual well check? The services and screenings done by your health care provider during your annual well check will depend on your age, overall health, lifestyle risk factors, and family history of disease. Counseling  Your health care provider may ask you questions about your:  Alcohol use.  Tobacco use.  Drug use.  Emotional well-being.  Home and relationship well-being.  Sexual activity.  Eating habits.  History of falls.  Memory and ability to understand (cognition).  Work and work Statistician.  Reproductive health. Screening  You may have the following tests or measurements:  Height, weight, and BMI.  Blood pressure.  Lipid and cholesterol levels. These may be checked every 5 years, or more frequently if you are over 28 years old.  Skin check.  Lung cancer screening. You may have this screening every year starting at age 36 if you have a 30-pack-year history of smoking and currently smoke or have quit within the past 15 years.  Fecal occult blood test (FOBT) of the stool. You may have this test every year starting at age 10.  Flexible sigmoidoscopy or colonoscopy. You may have a sigmoidoscopy every 5 years or a colonoscopy every 10 years starting at age 88.  Hepatitis C blood test.  Hepatitis B blood test.  Sexually transmitted disease (STD) testing.  Diabetes screening. This is done by checking your blood sugar (glucose) after you have not eaten for a while (fasting). You may have this done every 1-3 years.  Bone density scan. This is done to screen for osteoporosis. You may have this done starting at  age 4.  Mammogram. This may be done every 1-2 years. Talk to your health care provider about how often you should have regular mammograms. Talk with your health care provider about your test results, treatment options, and if necessary, the need for more tests. Vaccines  Your  health care provider may recommend certain vaccines, such as:  Influenza vaccine. This is recommended every year.  Tetanus, diphtheria, and acellular pertussis (Tdap, Td) vaccine. You may need a Td booster every 10 years.  Zoster vaccine. You may need this after age 16.  Pneumococcal 13-valent conjugate (PCV13) vaccine. One dose is recommended after age 8.  Pneumococcal polysaccharide (PPSV23) vaccine. One dose is recommended after age 90. Talk to your health care provider about which screenings and vaccines you need and how often you need them. This information is not intended to replace advice given to you by your health care provider. Make sure you discuss any questions you have with your health care provider. Document Released: 05/01/2015 Document Revised: 12/23/2015 Document Reviewed: 02/03/2015 Elsevier Interactive Patient Education  2017 [employer] Prevention in the Home Falls can cause injuries. They can happen to people of all ages. There are many things you can do to make your home safe and to help prevent falls. What can I do on the outside of my home?  Regularly fix the edges of walkways and driveways and fix any cracks.  Remove anything that might make you trip as you walk through a door, such as a raised step or threshold.  Trim any bushes or trees on the path to your home.  Use bright outdoor lighting.  Clear any walking paths of anything that might make someone trip, such as rocks or tools.  Regularly check to see if handrails are loose or broken. Make sure that both sides of any steps have handrails.  Any raised decks and porches should have guardrails on the edges.  Have any leaves, snow, or ice cleared regularly.  Use sand or salt on walking paths during winter.  Clean up any spills in your garage right away. This includes oil or grease spills. What can I do in the bathroom?  Use night lights.  Install grab bars by the toilet and in the tub and  shower. Do not use towel bars as grab bars.  Use non-skid mats or decals in the tub or shower.  If you need to sit down in the shower, use a plastic, non-slip stool.  Keep the floor dry. Clean up any water that spills on the floor as soon as it happens.  Remove soap buildup in the tub or shower regularly.  Attach bath mats securely with double-sided non-slip rug tape.  Do not have throw rugs and other things on the floor that can make you trip. What can I do in the bedroom?  Use night lights.  Make sure that you have a light by your bed that is easy to reach.  Do not use any sheets or blankets that are too big for your bed. They should not hang down onto the floor.  Have a firm chair that has side arms. You can use this for support while you get dressed.  Do not have throw rugs and other things on the floor that can make you trip. What can I do in the kitchen?  Clean up any spills right away.  Avoid walking on wet floors.  Keep items that you use a lot in easy-to-reach places.  If you  need to reach something above you, use a strong step stool that has a grab bar.  Keep electrical cords out of the way.  Do not use floor polish or wax that makes floors slippery. If you must use wax, use non-skid floor wax.  Do not have throw rugs and other things on the floor that can make you trip. What can I do with my stairs?  Do not leave any items on the stairs.  Make sure that there are handrails on both sides of the stairs and use them. Fix handrails that are broken or loose. Make sure that handrails are as long as the stairways.  Check any carpeting to make sure that it is firmly attached to the stairs. Fix any carpet that is loose or worn.  Avoid having throw rugs at the top or bottom of the stairs. If you do have throw rugs, attach them to the floor with carpet tape.  Make sure that you have a light switch at the top of the stairs and the bottom of the stairs. If you do not  have them, ask someone to add them for you. What else can I do to help prevent falls?  Wear shoes that:  Do not have high heels.  Have rubber bottoms.  Are comfortable and fit you well.  Are closed at the toe. Do not wear sandals.  If you use a stepladder:  Make sure that it is fully opened. Do not climb a closed stepladder.  Make sure that both sides of the stepladder are locked into place.  Ask someone to hold it for you, if possible.  Clearly mark and make sure that you can see:  Any grab bars or handrails.  First and last steps.  Where the edge of each step is.  Use tools that help you move around (mobility aids) if they are needed. These include:  Canes.  Walkers.  Scooters.  Crutches.  Turn on the lights when you go into a dark area. Replace any light bulbs as soon as they burn out.  Set up your furniture so you have a clear path. Avoid moving your furniture around.  If any of your floors are uneven, fix them.  If there are any pets around you, be aware of where they are.  Review your medicines with your doctor. Some medicines can make you feel dizzy. This can increase your chance of falling. Ask your doctor what other things that you can do to help prevent falls. This information is not intended to replace advice given to you by your health care provider. Make sure you discuss any questions you have with your health care provider. Document Released: 01/29/2009 Document Revised: 09/10/2015 Document Reviewed: 05/09/2014 Elsevier Interactive Patient Education  2017 [employer].

## 2018-01-20 LAB — COMPLETE METABOLIC PANEL WITH GFR
AG Ratio: 1.3 (calc) (ref 1.0–2.5)
ALT: 21 U/L (ref 6–29)
AST: 25 U/L (ref 10–35)
Albumin: 4.3 g/dL (ref 3.6–5.1)
Alkaline phosphatase (APISO): 54 U/L (ref 33–130)
BILIRUBIN TOTAL: 0.4 mg/dL (ref 0.2–1.2)
BUN: 19 mg/dL (ref 7–25)
CALCIUM: 9.5 mg/dL (ref 8.6–10.4)
CHLORIDE: 102 mmol/L (ref 98–110)
CO2: 29 mmol/L (ref 20–32)
Creat: 0.83 mg/dL (ref 0.60–0.93)
GFR, EST NON AFRICAN AMERICAN: 71 mL/min/{1.73_m2} (ref >=60)
GFR, Est African American: 83 mL/min/{1.73_m2} (ref >=60)
Globulin: 3.3 g/dL (calc) (ref 1.9–3.7)
Glucose, Bld: 88 mg/dL (ref 65–99)
POTASSIUM: 4.1 mmol/L (ref 3.5–5.3)
Sodium: 138 mmol/L (ref 135–146)
Total Protein: 7.6 g/dL (ref 6.1–8.1)

## 2018-01-20 LAB — CBC WITH DIFFERENTIAL/PLATELET
Basophils Absolute: 52 cells/uL (ref 0–200)
Basophils Relative: 1.3 %
Eosinophils Absolute: 60 cells/uL (ref 15–500)
Eosinophils Relative: 1.5 %
HCT: 36.7 % (ref 35.0–45.0)
Hemoglobin: 12 g/dL (ref 11.7–15.5)
Lymphs Abs: 1964 cells/uL (ref 850–3900)
MCH: 27 pg (ref 27.0–33.0)
MCHC: 32.7 g/dL (ref 32.0–36.0)
MCV: 82.5 fL (ref 80.0–100.0)
MONOS PCT: 7.3 %
MPV: 10.3 fL (ref 7.5–12.5)
NEUTROS PCT: 40.8 %
Neutro Abs: 1632 cells/uL (ref 1500–7800)
PLATELETS: 302 10*3/uL (ref 140–400)
RBC: 4.45 10*6/uL (ref 3.80–5.10)
RDW: 14.1 % (ref 11.0–15.0)
TOTAL LYMPHOCYTE: 49.1 %
WBC: 4 10*3/uL (ref 3.8–10.8)
WBCMIX: 292 {cells}/uL (ref 200–950)

## 2018-01-20 LAB — IRON,TIBC AND FERRITIN PANEL
%SAT: 31 % (ref 16–45)
Ferritin: 119 ng/mL (ref 16–288)
Iron: 89 ug/dL (ref 45–160)
TIBC: 286 ug/dL (ref 250–450)

## 2018-01-20 LAB — HEMOGLOBIN A1C
HEMOGLOBIN A1C: 5.9 %{Hb} — AB (ref <5.7)
MEAN PLASMA GLUCOSE: 123 (calc)
eAG (mmol/L): 6.8 (calc)

## 2018-01-20 LAB — VITAMIN B12: Vitamin B-12: 398 pg/mL (ref 200–1100)

## 2018-03-22 ENCOUNTER — Ambulatory Visit
Admission: RE | Admit: 2018-03-22 | Discharge: 2018-03-22 | Disposition: A | Discharge status: Home / Self Care | Payer: BLUE CROSS/BLUE SHIELD | Source: Ambulatory Visit | Attending: Family Medicine | Admitting: Family Medicine

## 2018-03-22 DIAGNOSIS — Z1231 Encounter for screening mammogram for malignant neoplasm of breast: Secondary | ICD-10-CM | POA: Insufficient documentation

## 2018-03-22 DIAGNOSIS — E2839 Other primary ovarian failure: Secondary | ICD-10-CM

## 2018-03-22 DIAGNOSIS — Z1239 Encounter for other screening for malignant neoplasm of breast: Secondary | ICD-10-CM

## 2018-06-08 ENCOUNTER — Encounter: Payer: Self-pay | Admitting: Nurse Practitioner

## 2018-06-08 ENCOUNTER — Ambulatory Visit (INDEPENDENT_AMBULATORY_CARE_PROVIDER_SITE_OTHER): Payer: BLUE CROSS/BLUE SHIELD | Admitting: Nurse Practitioner

## 2018-06-08 VITALS — BP 138/68 | HR 98 | Temp 98.8°F | Resp 14 | Ht 63.0 in | Wt 166.3 lb

## 2018-06-08 DIAGNOSIS — R05 Cough: Secondary | ICD-10-CM

## 2018-06-08 DIAGNOSIS — J302 Other seasonal allergic rhinitis: Secondary | ICD-10-CM | POA: Diagnosis not present

## 2018-06-08 DIAGNOSIS — R059 Cough, unspecified: Secondary | ICD-10-CM

## 2018-06-08 MED ORDER — BENZONATATE 200 MG PO CAPS: 200.0000 mg | ORAL_CAPSULE | Freq: Three times a day (TID) | ORAL | 1 refills | Status: DC | PRN

## 2018-06-08 MED ORDER — LORATADINE 10 MG PO TABS: 10.0000 mg | ORAL_TABLET | Freq: Every day | ORAL | 0 refills | Status: AC

## 2018-06-08 NOTE — Patient Instructions (Signed)
-   Please rest, drink lot of water, take cough medicine every 8 hours for the first few days and then just as needed, take Claritin (loratadine)  daily for the next week and then just as needed for runny nose. Use humidifier or take hot steamy showers. - If your cough is not improving please call and tell us and I can send you in a cough syrup or a steroid to ensure improvement - If you develop fevers, chills, shortness of breath please let us know right away; if it is severe or fevers are not controlled with tylenol or ibuprofen please get immediate medical attention.   Cough, Adult  A cough helps to clear your throat and lungs. A cough may last only 2-3 weeks (acute), or it may last longer than 8 weeks (chronic). Many different things can cause a cough. A cough may be a sign of an illness or another medical condition. Follow these instructions at home:  Pay attention to any changes in your cough.  Take medicines only as told by your doctor. ? If you were prescribed an antibiotic medicine, take it as told by your doctor. Do not stop taking it even if you start to feel better. ? Talk with your doctor before you try using a cough medicine.  Drink enough fluid to keep your pee (urine) clear or pale yellow.  If the air is dry, use a cold steam vaporizer or humidifier in your home.  Stay away from things that make you cough at work or at home.  If your cough is worse at night, try using extra pillows to raise your head up higher while you sleep.  Do not smoke, and try not to be around smoke. If you need help quitting, ask your doctor.  Do not have caffeine.  Do not drink alcohol.  Rest as needed. Contact a doctor if:  You have new problems (symptoms).  You cough up yellow fluid (pus).  Your cough does not get better after 2-3 weeks, or your cough gets worse.  Medicine does not help your cough and you are not sleeping well.  You have pain that gets worse or pain that is not helped  with medicine.  You have a fever.  You are losing weight and you do not know why.  You have night sweats. Get help right away if:  You cough up blood.  You have trouble breathing.  Your heartbeat is very fast. This information is not intended to replace advice given to you by your health care provider. Make sure you discuss any questions you have with your health care provider. Document Released: 12/16/2010 Document Revised: 09/10/2015 Document Reviewed: 06/11/2014 Elsevier Interactive Patient Education  2019 [employer].

## 2018-06-08 NOTE — Progress Notes (Signed)
Name: Hayley Howe   MRN: [id number]    DOB: May 28, 1945   Date:06/08/2018       Progress Note  Subjective  Chief Complaint  Chief Complaint  Patient presents with  . Cough    patient presents with dry cough that she has had for a couple of weeks. has tried OTC Mucinex and Flonase  . Wheezing    HPI  Patient endorses URI like illness started about 3 weeks ago with sore throat, runny nose and dry cough. Dry cough is persistent, despite other symptoms resolving. States gets coughing spells- worse at night. Has been using flonase and musinex without relief. Endorses feelings of chest congestion.   Denies fevers, chills, fatigue, shortness of breath, chest pain.   Patient Active Problem List   Diagnosis Date Noted  . Hyperglycemia 07/13/2017  . Chronic constipation 12/15/2016  . Primary osteoarthritis of left hip 11/05/2015  . Scoliosis 11/05/2015  . Chronic low back pain 10/30/2014  . Benign essential HTN 10/27/2014  . Bradycardia 10/27/2014  . Insomnia 10/27/2014  . Headache, temporal 10/27/2014  . Hypercholesteremia 10/27/2014  . Obesity (BMI 30-39.9) 10/27/2014  . Changing sleep-work schedule, affecting sleep 10/27/2014  . Vitamin D deficiency 10/27/2014  . Arthritis of knee, degenerative 09/24/2009    Past Medical History:  Diagnosis Date  . Hyperlipidemia   . Hypertension   . Insomnia   . Low back pain   . Obesity   . Osteoarthritis of left knee     Past Surgical History:  Procedure Laterality Date  . ABDOMINAL HYSTERECTOMY    . BREAST BIOPSY Right 01/21/2013   stereo - benign  . COLONOSCOPY WITH PROPOFOL N/A 06/30/2016   Procedure: COLONOSCOPY WITH PROPOFOL;  Surgeon: Jonathon Bellows, MD;  Location: [facility] ENDOSCOPY;  Service: Endoscopy;  Laterality: N/A;  . KNEE SURGERY Left 2012   Dr. Rudene Christians  . OOPHORECTOMY    . TUBAL LIGATION    . VAGINAL PROLAPSE REPAIR      Social History   Tobacco Use  . Smoking status: Former Smoker    Packs/day: 0.25    Years: 1.00     Pack years: 0.25    Types: Cigarettes    Start date: 08/13/1967    Last attempt to quit: 04/18/1977    Years since quitting: 41.1  . Smokeless tobacco: Never Used  . Tobacco comment: smoking cessation materials not required  Substance Use Topics  . Alcohol use: No    Alcohol/week: 0.0 standard drinks     Current Outpatient Medications:  .  amLODipine (NORVASC) 10 MG tablet, Take 1 tablet (10 mg total) by mouth daily., Disp: 90 tablet, Rfl: 1 .  aspirin EC 81 MG tablet, Take 1 tablet (81 mg total) by mouth daily., Disp: 30 tablet, Rfl: 0 .  Cholecalciferol (VITAMIN D) 2000 UNITS tablet, Take 1 tablet by mouth daily., Disp: , Rfl:  .  fluticasone (FLONASE) 50 MCG/ACT nasal spray, Place 2 sprays into both nostrils daily., Disp: 16 g, Rfl: 2 .  lisinopril-hydrochlorothiazide (PRINZIDE,ZESTORETIC) 20-12.5 MG tablet, Take 1 tablet by mouth daily., Disp: 90 tablet, Rfl: 1 .  loratadine (CLARITIN) 10 MG tablet, Take 1 tablet (10 mg total) by mouth daily., Disp: 30 tablet, Rfl: 5 .  Magnesium 250 MG TABS, Take 1 tablet by mouth daily., Disp: , Rfl:  .  metaxalone (SKELAXIN) 800 MG tablet, Take 1 tablet (800 mg total) by mouth 3 (three) times daily as needed for muscle spasms., Disp: 90 tablet, Rfl: 1 .  pravastatin (PRAVACHOL) 40 MG tablet, Take 1 tablet (40 mg total) by mouth daily., Disp: 90 tablet, Rfl: 1 .  traMADol (ULTRAM) 50 MG tablet, Take 1 tablet (50 mg total) by mouth 2 (two) times daily as needed., Disp: 60 tablet, Rfl: 2 .  vitamin C (ASCORBIC ACID) 500 MG tablet, Take 1 tablet by mouth daily., Disp: , Rfl:  .  zolpidem (AMBIEN CR) 12.5 MG CR tablet, Take 1 tablet (12.5 mg total) by mouth at bedtime., Disp: 30 tablet, Rfl: 3  No Known Allergies  ROS   No other specific complaints in a complete review of systems (except as listed in HPI above).  Objective  Vitals:   06/08/18 1007  BP: 138/68  Pulse: 98  Resp: 14  Temp: 98.8 F (37.1 C)  TempSrc: Oral  SpO2: 99%   Weight: 166 lb 4.8 oz (75.4 kg)  Height: 5\' 3"  (1.6 m)   Body mass index is 29.46 kg/m.  Nursing Note and Vital Signs reviewed.  Physical Exam HENT:     Head: Normocephalic and atraumatic.     Right Ear: Hearing, tympanic membrane, ear canal and external ear normal.     Left Ear: Hearing, tympanic membrane, ear canal and external ear normal.     Nose: Rhinorrhea present.     Right Sinus: No maxillary sinus tenderness or frontal sinus tenderness.     Left Sinus: No maxillary sinus tenderness or frontal sinus tenderness.     Mouth/Throat:     Mouth: Mucous membranes are moist.     Pharynx: Uvula midline. No oropharyngeal exudate or posterior oropharyngeal erythema.  Eyes:     General:        Right eye: No discharge.        Left eye: No discharge.     Conjunctiva/sclera: Conjunctivae normal.  Neck:     Musculoskeletal: Normal range of motion.  Cardiovascular:     Rate and Rhythm: Normal rate.     Pulses: Normal pulses.  Pulmonary:     Effort: Pulmonary effort is normal.     Breath sounds: Normal breath sounds. No wheezing.     Comments: Dry cough during exam  Lymphadenopathy:     Cervical: No cervical adenopathy.  Skin:    General: Skin is warm and dry.     Findings: No rash.  Neurological:     Mental Status: She is alert.  Psychiatric:        Judgment: Judgment normal.       No results found for this or any previous visit (from the past 48 hour(s)).  Assessment & Plan 1. Cough Discussed if not improving to call and let us know can send cough syrup and considered prednisone taper. - benzonatate (TESSALON) 200 MG capsule; Take 1 capsule (200 mg total) by mouth 3 (three) times daily as needed for cough.  Dispense: 30 capsule; Refill: 1  2. Seasonal allergic rhinitis - loratadine (CLARITIN) 10 MG tablet; Take 1 tablet (10 mg total) by mouth daily.  Dispense: 30 tablet; Refill: 0    -Red flags and when to present for emergency care or RTC including fever  >101.74F, chest pain, shortness of breath, new/worsening/un-resolving symptoms,  reviewed with patient at time of visit. Follow up and care instructions discussed and provided in AVS.

## 2018-06-21 ENCOUNTER — Ambulatory Visit: Payer: Self-pay | Admitting: *Deleted

## 2018-06-21 NOTE — Telephone Encounter (Signed)
Contacted pt regarding symptoms; she states that her cough is continuing, and last night it woke her up; the pt says that she has taken claritin and tessalon as previously prescribed at her 06/08/2018 visit; the pt says that it gets worse when she lays down, and her cough is tight" (can not expectorate secretions); nurse triage initiated and recommendations made per protocol; pt offered and accepted appointment with Dr Ancil Boozer, [place], 06/22/2018 at 1020; she verbalized understanding; will route to office for notification.  Reason for Disposition . [1] Continuous (nonstop) coughing interferes with work or school AND [2] no improvement using cough treatment per protocol  Answer Assessment - Initial Assessment Questions 1. ONSET: "When did the cough begin?"      Seen in office 06/08/2018 2. SEVERITY: "How bad is the cough today?"      Moderate to severe 3. RESPIRATORY DISTRESS: "Describe your breathing."  No shortness of breath 4. FEVER: "Do you have a fever?" If so, ask: "What is your temperature, how was it measured, and when did it start?"     no 5. HEMOPTYSIS: "Are you coughing up any blood?" If so ask: "How much?" (flecks, streaks, tablespoons, etc.)   no 6. TREATMENT: "What have you done so far to treat the cough?" (e.g., meds, fluids, humidifier)     Tessalon and flonase; tried OTC mucinex, and robitussin 7. CARDIAC HISTORY: "Do you have any history of heart disease?" (e.g., heart attack, congestive heart failure)      hypertension 8. LUNG HISTORY: "Do you have any history of lung disease?"  (e.g., pulmonary embolus, asthma, emphysema)     no 9. PE RISK FACTORS: "Do you have a history of blood clots?" (or: recent major surgery, recent prolonged travel, bedridden)     no 10. OTHER SYMPTOMS: "Do you have any other symptoms? (e.g., runny nose, wheezing, chest pain)     Nasal congestion and runny nose; chest congestion, wheezing when laying down  11. PREGNANCY: "Is there any chance you are  pregnant?" "When was your last menstrual period?"       no 12. TRAVEL: "Have you traveled out of the country in the last month?" (e.g., travel history, exposures)       no  Protocols used: COUGH - ACUTE NON-PRODUCTIVE-A-AH

## 2018-06-22 ENCOUNTER — Encounter: Payer: Self-pay | Admitting: Family Medicine

## 2018-06-22 ENCOUNTER — Ambulatory Visit (INDEPENDENT_AMBULATORY_CARE_PROVIDER_SITE_OTHER): Payer: BLUE CROSS/BLUE SHIELD | Admitting: Family Medicine

## 2018-06-22 VITALS — BP 130/70 | HR 91 | Temp 98.2°F | Resp 16 | Ht 63.0 in | Wt 168.9 lb

## 2018-06-22 DIAGNOSIS — J302 Other seasonal allergic rhinitis: Secondary | ICD-10-CM | POA: Diagnosis not present

## 2018-06-22 DIAGNOSIS — R059 Cough, unspecified: Secondary | ICD-10-CM

## 2018-06-22 DIAGNOSIS — R05 Cough: Secondary | ICD-10-CM | POA: Diagnosis not present

## 2018-06-22 DIAGNOSIS — I10 Essential (primary) hypertension: Secondary | ICD-10-CM

## 2018-06-22 DIAGNOSIS — R062 Wheezing: Secondary | ICD-10-CM | POA: Diagnosis not present

## 2018-06-22 MED ORDER — HYDROCOD POLST-CPM POLST ER 10-8 MG/5ML PO SUER: 5.0000 mL | Freq: Two times a day (BID) | ORAL | 0 refills | Status: DC | PRN

## 2018-06-22 MED ORDER — VALSARTAN-HYDROCHLOROTHIAZIDE 160-12.5 MG PO TABS: 1.0000 | ORAL_TABLET | Freq: Every day | ORAL | 0 refills | Status: DC

## 2018-06-22 MED ORDER — FLUTICASONE FUROATE-VILANTEROL 100-25 MCG/INH IN AEPB: 1.0000 | INHALATION_SPRAY | Freq: Every day | RESPIRATORY_TRACT | 0 refills | Status: DC

## 2018-06-22 MED ORDER — MONTELUKAST SODIUM 10 MG PO TABS: 10.0000 mg | ORAL_TABLET | Freq: Every day | ORAL | 0 refills | Status: DC

## 2018-06-22 NOTE — Progress Notes (Signed)
Name: Hayley Howe   MRN: [id number]    DOB: January 03, 1946   Date:06/22/2018       Progress Note  Subjective  Chief Complaint  Chief Complaint  Patient presents with  . Cough    Recurrent non productive cough x 2 weeks. Evaluated by [place] medication prescribed and taken as prescribed no improvement.    HPI  Cough: patient states symptoms started about one month ago and is not improving. She has a history of AR and has noticed nasal congestion, rhinorrhea, but the cough is the worst. Usually dry but occasionally productive . She denies sob but has wheezing at night, no edema or orthopnea. She has been afebrile. She has been taking allergy medication She takes ace and we will change to ARB today   Patient Active Problem List   Diagnosis Date Noted  . Hyperglycemia 07/13/2017  . Chronic constipation 12/15/2016  . Primary osteoarthritis of left hip 11/05/2015  . Scoliosis 11/05/2015  . Chronic low back pain 10/30/2014  . Benign essential HTN 10/27/2014  . Bradycardia 10/27/2014  . Insomnia 10/27/2014  . Headache, temporal 10/27/2014  . Hypercholesteremia 10/27/2014  . Obesity (BMI 30-39.9) 10/27/2014  . Changing sleep-work schedule, affecting sleep 10/27/2014  . Vitamin D deficiency 10/27/2014  . Arthritis of knee, degenerative 09/24/2009    Past Surgical History:  Procedure Laterality Date  . ABDOMINAL HYSTERECTOMY    . BREAST BIOPSY Right 01/21/2013   stereo - benign  . COLONOSCOPY WITH PROPOFOL N/A 06/30/2016   Procedure: COLONOSCOPY WITH PROPOFOL;  Surgeon: Jonathon Bellows, MD;  Location: [facility] ENDOSCOPY;  Service: Endoscopy;  Laterality: N/A;  . KNEE SURGERY Left 2012   Dr. Rudene Christians  . OOPHORECTOMY    . TUBAL LIGATION    . VAGINAL PROLAPSE REPAIR      Family History  Problem Relation Age of Onset  . Breast cancer Sister 30       in year 2013  . Cancer Sister   . Hyperlipidemia Mother   . Diabetes Mother   . Cancer Father        Leukemia  . Hyperlipidemia Father   .  Cancer Brother        Lung  . Cancer Sister     Social History   Socioeconomic History  . Marital status: Widowed    Spouse name: Not on file  . Number of children: 2  . Years of education: Not on file  . Highest education level: 12th grade  Occupational History  . Occupation: Education officer, museum: [employer]  . Financial resource strain: Not hard at all  . Food insecurity:    Worry: Never true    Inability: Never true  . Transportation needs:    Medical: No    Non-medical: No  Tobacco Use  . Smoking status: Former Smoker    Packs/day: 0.25    Years: 1.00    Pack years: 0.25    Types: Cigarettes    Start date: 08/13/1967    Last attempt to quit: 04/18/1977    Years since quitting: 41.2  . Smokeless tobacco: Never Used  . Tobacco comment: smoking cessation materials not required  Substance and Sexual Activity  . Alcohol use: No    Alcohol/week: 0.0 standard drinks  . Drug use: No  . Sexual activity: Not Currently  Lifestyle  . Physical activity:    Days per week: 0 days    Minutes per session: 0 min  . Stress: Not  at all  Relationships  . Social connections:    Talks on phone: More than three times a week    Gets together: Twice a week    Attends religious service: More than 4 times per year    Active member of club or organization: Yes    Attends meetings of clubs or organizations: More than 4 times per year    Relationship status: Widowed  . Intimate partner violence:    Fear of current or ex partner: No    Emotionally abused: No    Physically abused: No    Forced sexual activity: No  Other Topics Concern  . Not on file  Social History Narrative   Daughter lives with her   Still working full time at [employer]      Mother of Ajwa Kimberley     Current Outpatient Medications:  .  amLODipine (NORVASC) 10 MG tablet, Take 1 tablet (10 mg total) by mouth daily., Disp: 90 tablet, Rfl: 1 .  aspirin EC 81 MG tablet, Take 1 tablet (81 mg total) by mouth  daily., Disp: 30 tablet, Rfl: 0 .  benzonatate (TESSALON) 200 MG capsule, Take 1 capsule (200 mg total) by mouth 3 (three) times daily as needed for cough., Disp: 30 capsule, Rfl: 1 .  Cholecalciferol (VITAMIN D) 2000 UNITS tablet, Take 1 tablet by mouth daily., Disp: , Rfl:  .  fluticasone (FLONASE) 50 MCG/ACT nasal spray, Place 2 sprays into both nostrils daily., Disp: 16 g, Rfl: 2 .  lisinopril-hydrochlorothiazide (PRINZIDE,ZESTORETIC) 20-12.5 MG tablet, Take 1 tablet by mouth daily., Disp: 90 tablet, Rfl: 1 .  loratadine (CLARITIN) 10 MG tablet, Take 1 tablet (10 mg total) by mouth daily., Disp: 30 tablet, Rfl: 0 .  Magnesium 250 MG TABS, Take 1 tablet by mouth daily., Disp: , Rfl:  .  metaxalone (SKELAXIN) 800 MG tablet, Take 1 tablet (800 mg total) by mouth 3 (three) times daily as needed for muscle spasms., Disp: 90 tablet, Rfl: 1 .  pravastatin (PRAVACHOL) 40 MG tablet, Take 1 tablet (40 mg total) by mouth daily., Disp: 90 tablet, Rfl: 1 .  traMADol (ULTRAM) 50 MG tablet, Take 1 tablet (50 mg total) by mouth 2 (two) times daily as needed., Disp: 60 tablet, Rfl: 2 .  vitamin C (ASCORBIC ACID) 500 MG tablet, Take 1 tablet by mouth daily., Disp: , Rfl:  .  zolpidem (AMBIEN CR) 12.5 MG CR tablet, Take 1 tablet (12.5 mg total) by mouth at bedtime., Disp: 30 tablet, Rfl: 3  No Known Allergies  I personally reviewed active problem list, medication list, allergies, family history, social history with the patient/caregiver today.   ROS  Ten systems reviewed and is negative except as mentioned in HPI   Objective  Vitals:   06/22/18 1028  BP: 130/70  Pulse: 91  Resp: 16  Temp: 98.2 F (36.8 C)  TempSrc: Oral  SpO2: 99%  Weight: 168 lb 14.4 oz (76.6 kg)  Height: 5\' 3"  (1.6 m)    Body mass index is 29.92 kg/m.  Physical Exam  Constitutional: Patient appears well-developed and well-nourished. Overweight. No distress.  HEENT: head atraumatic, normocephalic, pupils equal and  reactive to light, ears some wax on right ear canal, pale turbinates, clear rhinorrhea  neck supple, throat within normal limits Cardiovascular: Normal rate, regular rhythm and normal heart sounds.  No murmur heard. No BLE edema. Pulmonary/Chest: Effort normal and breath sounds normal. No respiratory distress. Abdominal: Soft.  There is no tenderness. Psychiatric: Patient  has a normal mood and affect. behavior is normal. Judgment and thought content normal.  PHQ2/9: Depression screen [facility] 2/9 06/08/2018 01/19/2018 01/19/2018 03/23/2017 08/12/2016  Decreased Interest 0 0 0 0 0  Down, Depressed, Hopeless 0 0 0 0 0  PHQ - 2 Score 0 0 0 0 0  Altered sleeping - 0 - - -  Tired, decreased energy - 0 - - -  Change in appetite - 0 - - -  Feeling bad or failure about yourself  - 0 - - -  Trouble concentrating - 0 - - -  Moving slowly or fidgety/restless - 0 - - -  Suicidal thoughts - 0 - - -  PHQ-9 Score - 0 - - -  Difficult doing work/chores - Not difficult at all - - -     Fall Risk: Fall Risk  06/22/2018 06/08/2018 01/19/2018 01/19/2018 07/13/2017  Falls in the past year? 0 0 No No No  Number falls in past yr: 0 0 - - -  Injury with Fall? 0 0 - - -  Risk for fall due to : - - Impaired vision - -  Risk for fall due to: Comment - - wears eyeglasses - -      Assessment & Plan  1. Wheezing  - montelukast (SINGULAIR) 10 MG tablet; Take 1 tablet (10 mg total) by mouth at bedtime.  Dispense: 90 tablet; Refill: 0 - fluticasone furoate-vilanterol (BREO ELLIPTA) 100-25 MCG/INH AEPB; Inhale 1 puff into the lungs daily.  Dispense: 60 each; Refill: 0  2. Cough  - chlorpheniramine-HYDROcodone (TUSSIONEX PENNKINETIC ER) 10-8 MG/5ML SUER; Take 5 mLs by mouth every 12 (twelve) hours as needed. Not at the same time as Ambien  Dispense: 140 mL; Refill: 0  3. Seasonal allergic rhinitis, unspecified trigger  - montelukast (SINGULAIR) 10 MG tablet; Take 1 tablet (10 mg total) by mouth at bedtime.  Dispense: 90  tablet; Refill: 0  4. Benign essential HTN  Stop ACE because it could be the cause of cough  - valsartan-hydrochlorothiazide (DIOVAN-HCT) 160-12.5 MG tablet; Take 1 tablet by mouth daily.  Dispense: 90 tablet; Refill: 0

## 2018-07-26 ENCOUNTER — Encounter: Payer: Self-pay | Admitting: Family Medicine

## 2018-07-26 ENCOUNTER — Ambulatory Visit (INDEPENDENT_AMBULATORY_CARE_PROVIDER_SITE_OTHER): Payer: BLUE CROSS/BLUE SHIELD | Admitting: Family Medicine

## 2018-07-26 ENCOUNTER — Other Ambulatory Visit: Payer: Self-pay

## 2018-07-26 VITALS — BP 138/79 | HR 78

## 2018-07-26 DIAGNOSIS — F5104 Psychophysiologic insomnia: Secondary | ICD-10-CM

## 2018-07-26 DIAGNOSIS — J302 Other seasonal allergic rhinitis: Secondary | ICD-10-CM | POA: Diagnosis not present

## 2018-07-26 DIAGNOSIS — M5441 Lumbago with sciatica, right side: Secondary | ICD-10-CM

## 2018-07-26 DIAGNOSIS — M170 Bilateral primary osteoarthritis of knee: Secondary | ICD-10-CM | POA: Diagnosis not present

## 2018-07-26 DIAGNOSIS — G4726 Circadian rhythm sleep disorder, shift work type: Secondary | ICD-10-CM

## 2018-07-26 DIAGNOSIS — R05 Cough: Secondary | ICD-10-CM | POA: Diagnosis not present

## 2018-07-26 DIAGNOSIS — R062 Wheezing: Secondary | ICD-10-CM

## 2018-07-26 DIAGNOSIS — I10 Essential (primary) hypertension: Secondary | ICD-10-CM | POA: Diagnosis not present

## 2018-07-26 DIAGNOSIS — E7800 Pure hypercholesterolemia, unspecified: Secondary | ICD-10-CM

## 2018-07-26 DIAGNOSIS — R059 Cough, unspecified: Secondary | ICD-10-CM

## 2018-07-26 DIAGNOSIS — G8929 Other chronic pain: Secondary | ICD-10-CM

## 2018-07-26 MED ORDER — MONTELUKAST SODIUM 10 MG PO TABS: 10.0000 mg | ORAL_TABLET | Freq: Every day | ORAL | 0 refills | Status: DC

## 2018-07-26 MED ORDER — VALSARTAN-HYDROCHLOROTHIAZIDE 160-12.5 MG PO TABS: 1.0000 | ORAL_TABLET | Freq: Every day | ORAL | 1 refills | Status: DC

## 2018-07-26 MED ORDER — TRAMADOL HCL 50 MG PO TABS: 50.0000 mg | ORAL_TABLET | Freq: Two times a day (BID) | ORAL | 2 refills | Status: DC | PRN

## 2018-07-26 MED ORDER — PRAVASTATIN SODIUM 40 MG PO TABS: 40.0000 mg | ORAL_TABLET | Freq: Every day | ORAL | 1 refills | Status: DC

## 2018-07-26 MED ORDER — ZOLPIDEM TARTRATE ER 12.5 MG PO TBCR: 12.5000 mg | EXTENDED_RELEASE_TABLET | Freq: Every day | ORAL | 0 refills | Status: DC

## 2018-07-26 MED ORDER — AMLODIPINE BESYLATE 10 MG PO TABS: 10.0000 mg | ORAL_TABLET | Freq: Every day | ORAL | 1 refills | Status: DC

## 2018-07-26 NOTE — Progress Notes (Signed)
Name: Hayley Howe   MRN: [id number]    DOB: Apr 11, 1946   Date:07/26/2018       Progress Note  Subjective  Chief Complaint  Chief Complaint  Patient presents with   Hypertension   Insomnia    Ambien helps sleeping about 5 hours per night with medication.    I connected with@ on 07/26/18 at  7:40 AM EDT by telephone and verified that I am speaking with the correct person using two identifiers.  I discussed the limitations, risks, security and privacy concerns of performing an evaluation and management service by telephone and the availability of in person appointments. Staff also discussed with the patient that there may be a patient responsible charge related to this service. Patient Location: home  Provider Location: [facility]  HPI  Cough: she was seen March 2020 and we stopped the ACE and gave her diovan hctz, she states cough improved but never resolved. She states it was also productive but now is dry. She smoked many years ago but quit in her 33's . She never got the [facility] because of the cost, but she took singulair and would like a refill since she has noticed some wheezing and the pollen is bothering her.   Hyperlipidemia: taking pravastatin and denies myalgias; last Lipids 06/2017 and LDL was 90,no chest pain or decrease in exercise tolerance. Advised to resume Aspirin 81 mg daily because ASCVD risk 11.4% but risk of bleeding is only 2.3%. Unchanged, no side effects of medication   Chronic Low Back: she has a long history of low back pain, not secondary to trauma, DDD lumbar spine and also scoliosis. taking Tramadol daily to control symptoms(takes 1 daily, takes 2nd tablets a few times a week), she is also on Skelaxin prn - she states has not taken one in about one month.  Unable to tolerate Flexeril - it makes her sleepy. . Pain is constant but worse after work.  Occasionally the pain radiates to bilateralthighs,and when radiating it is described as  numbness however not recently. Pain at this time is 2/10 , she did not work last night. She goes to chiropractor( Dr. Genoveva Ill a month andis stable.  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 She sleepsfrom around2pm-8:30pmfor about 5-7hours when she sleeps during the day and about 5 hours during the night.Taking Ambien, denies side effects of medication, she is aware of FDA guidelines however unable to sleep with lower dose of Ambien.She wants to continue medication , she does not take medication the nights she is off work   OA: she has aching pain on left knee, s/p left knee replaced, only triggered by kneeling down, surgery was done by Dr. Rudene Christians in 2012, no recent visits with him, she states she is doing well at this time  Hyperglycemia: last hgbA1C was 5.9%, denies polyphagia, polydipsia or polyuria, avoiding starches.   AR: seasonal  She has occasional rhinorrhea and sometimes nasal congestion, but mild, worse symptoms is a dry cough right now  HTN: taking valsartan hctz, since we stopped lisinopril and is doing well, no side effects, no chest pain or palpitation. BP at home today was at goal.    Patient Active Problem List   Diagnosis Date Noted   Hyperglycemia 07/13/2017   Chronic constipation 12/15/2016   Primary osteoarthritis of left hip 11/05/2015   Scoliosis 11/05/2015   Chronic low back pain 10/30/2014   Benign essential HTN 10/27/2014   Bradycardia 10/27/2014  Insomnia 10/27/2014   Headache, temporal 10/27/2014   Hypercholesteremia 10/27/2014   Obesity (BMI 30-39.9) 10/27/2014   Changing sleep-work schedule, affecting sleep 10/27/2014   Vitamin D deficiency 10/27/2014   Arthritis of knee, degenerative 09/24/2009    Past Surgical History:  Procedure Laterality Date   ABDOMINAL HYSTERECTOMY     BREAST BIOPSY Right 01/21/2013   stereo - benign   COLONOSCOPY WITH PROPOFOL N/A 06/30/2016    Procedure: COLONOSCOPY WITH PROPOFOL;  Surgeon: Jonathon Bellows, MD;  Location: [facility] ENDOSCOPY;  Service: Endoscopy;  Laterality: N/A;   KNEE SURGERY Left 2012   Dr. 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[employer] [place] resource strain: Not hard at all   Food insecurity:    Worry: Never true    Inability: Never true   Transportation needs:    Medical: No    Non-medical: No  Tobacco Use   Smoking status: Former Smoker    Packs/day: 0.25    Years: 1.00    Pack years: 0.25    Types: Cigarettes    Start date: 08/13/1967    Last attempt to quit: 04/18/1977    Years since quitting: 41.2   Smokeless tobacco: Never Used   Tobacco comment: smoking cessation materials not required  Substance and Sexual Activity   Alcohol use: No    Alcohol/week: 0.0 standard drinks   Drug use: No   Sexual activity: Not Currently  Lifestyle   Physical activity:    Days per week: 0 days    Minutes per session: 0 min   Stress: Not at all  Relationships   Social connections:    Talks on phone: More than three times a week    Gets together: Twice a week    Attends religious service: More than 4 times per year    Active member of club or organization: Yes    Attends meetings of clubs or organizations: More than 4 times per year    Relationship status: Widowed   Intimate partner violence:    Fear of current or  ex partner: No    Emotionally abused: No    Physically abused: No    Forced sexual activity: No  Other Topics Concern   Not on file  Social History Narrative   Daughter lives with her   Still working full time at [employer]      Mother of Audray Rumore     Current Outpatient Medications:    amLODipine (NORVASC) 10 MG tablet, Take 1 tablet (10 mg total) by mouth daily., Disp: 90 tablet, Rfl: 1   aspirin EC 81 MG tablet, Take 1 tablet (81 mg total) by  mouth daily., Disp: 30 tablet, Rfl: 0   chlorpheniramine-HYDROcodone (TUSSIONEX PENNKINETIC ER) 10-8 MG/5ML SUER, Take 5 mLs by mouth every 12 (twelve) hours as needed. Not at the same time as Ambien, Disp: 140 mL, Rfl: 0   Cholecalciferol (VITAMIN D) 2000 UNITS tablet, Take 1 tablet by mouth daily., Disp: , Rfl:    fluticasone (FLONASE) 50 MCG/ACT nasal spray, Place 2 sprays into both nostrils daily., Disp: 16 g, Rfl: 2   fluticasone furoate-vilanterol (BREO ELLIPTA) 100-25 MCG/INH AEPB, Inhale 1 puff into the lungs daily., Disp: 60 each, Rfl: 0   loratadine (CLARITIN) 10 MG tablet, Take 1 tablet (10 mg total) by mouth daily., Disp: 30 tablet, Rfl: 0   Magnesium 250 MG TABS, Take 1 tablet by mouth daily., Disp: , Rfl:    metaxalone (SKELAXIN) 800 MG tablet, Take 1 tablet (800 mg total) by mouth 3 (three) times daily as needed for muscle spasms., Disp: 90 tablet, Rfl: 1   montelukast (SINGULAIR) 10 MG tablet, Take 1 tablet (10 mg total) by mouth at bedtime., Disp: 90 tablet, Rfl: 0   pravastatin (PRAVACHOL) 40 MG tablet, Take 1 tablet (40 mg total) by mouth daily., Disp: 90 tablet, Rfl: 1   traMADol (ULTRAM) 50 MG tablet, Take 1 tablet (50 mg total) by mouth 2 (two) times daily as needed., Disp: 60 tablet, Rfl: 2   valsartan-hydrochlorothiazide (DIOVAN-HCT) 160-12.5 MG tablet, Take 1 tablet by mouth daily., Disp: 90 tablet, Rfl: 0   vitamin C (ASCORBIC ACID) 500 MG tablet, Take 1 tablet by mouth daily., Disp: , Rfl:    zolpidem  (AMBIEN CR) 12.5 MG CR tablet, Take 1 tablet (12.5 mg total) by mouth at bedtime., Disp: 30 tablet, Rfl: 3  No Known Allergies  I personally reviewed active problem list, medication list, allergies, family history, social history with the patient/caregiver today.   ROS  Ten systems reviewed and is negative except as mentioned in HPI   Objective  Virtual encounter, vitals not obtained.  There is no height or weight on file to calculate BMI.  Physical Exam  Awake, speaking in full sentences and no distress   PHQ2/9: Depression screen [facility] 2/9 07/26/2018 06/22/2018 06/08/2018 01/19/2018 01/19/2018  Decreased Interest 0 0 0 0 0  Down, Depressed, Hopeless 0 0 0 0 0  PHQ - 2 Score 0 0 0 0 0  Altered sleeping 0 0 - 0 -  Tired, decreased energy 0 0 - 0 -  Change in appetite 0 0 - 0 -  Feeling bad or failure about yourself  0 0 - 0 -  Trouble concentrating 0 0 - 0 -  Moving slowly or fidgety/restless 0 0 - 0 -  Suicidal thoughts 0 0 - 0 -  PHQ-9 Score 0 0 - 0 -  Difficult doing work/chores - - - Not difficult at all -   PHQ-2/9 Result is negative.    Fall Risk: Fall Risk  07/26/2018 06/22/2018 06/08/2018 01/19/2018 01/19/2018  Falls in the past year? 0 0 0 No No  Number falls in past yr: 0 0 0 - -  Injury with Fall? 0 0 0 - -  Risk for fall due to : - - - Impaired vision -  Risk for fall due to: Comment - - - wears eyeglasses -     Assessment & Plan  1. Benign essential HTN  - amLODipine (NORVASC) 10 MG tablet; Take 1 tablet (10 mg total) by mouth daily.  Dispense: 90 tablet; Refill: 1 -  valsartan-hydrochlorothiazide (DIOVAN-HCT) 160-12.5 MG tablet; Take 1 tablet by mouth daily.  Dispense: 90 tablet; Refill: 1  2. Wheezing  - montelukast (SINGULAIR) 10 MG tablet; Take 1 tablet (10 mg total) by mouth at bedtime.  Dispense: 90 tablet; Refill: 0  3. Chronic right-sided low back pain with right-sided sciatica  - traMADol (ULTRAM) 50 MG tablet; Take 1 tablet (50 mg total) by mouth 2  (two) times daily as needed.  Dispense: 60 tablet; Refill: 2  4. Seasonal allergic rhinitis, unspecified trigger  - montelukast (SINGULAIR) 10 MG tablet; Take 1 tablet (10 mg total) by mouth at bedtime.  Dispense: 90 tablet; Refill: 0  5. Hypercholesteremia  - pravastatin (PRAVACHOL) 40 MG tablet; Take 1 tablet (40 mg total) by mouth daily.  Dispense: 90 tablet; Refill: 1  6. Chronic insomnia  - zolpidem (AMBIEN CR) 12.5 MG CR tablet; Take 1 tablet (12.5 mg total) by mouth at bedtime.  Dispense: 90 tablet; Refill: 0  7. Changing sleep-work schedule, affecting sleep  - zolpidem (AMBIEN CR) 12.5 MG CR tablet; Take 1 tablet (12.5 mg total) by mouth at bedtime.  Dispense: 90 tablet; Refill: 0  8. Primary osteoarthritis of both knees  - traMADol (ULTRAM) 50 MG tablet; Take 1 tablet (50 mg total) by mouth 2 (two) times daily as needed.  Dispense: 60 tablet; Refill: 2  9. Cough  She wants to hold off on getting CXR until next visit, or she will call if she decides to have it done sooner   I discussed the assessment and treatment plan with the patient. The patient was provided an opportunity to ask questions and all were answered. The patient agreed with the plan and demonstrated an understanding of the instructions.   The patient was advised to call back or seek an in-person evaluation if the symptoms worsen or if the condition fails to improve as anticipated.  I provided 21  minutes of non-face-to-face time during this encounter.  Loistine Chance, MD

## 2018-10-19 ENCOUNTER — Other Ambulatory Visit: Payer: Self-pay | Admitting: Family Medicine

## 2018-10-19 DIAGNOSIS — J301 Allergic rhinitis due to pollen: Secondary | ICD-10-CM

## 2018-10-19 DIAGNOSIS — H6993 Unspecified Eustachian tube disorder, bilateral: Secondary | ICD-10-CM

## 2018-11-15 ENCOUNTER — Ambulatory Visit: Payer: BLUE CROSS/BLUE SHIELD | Admitting: Family Medicine

## 2018-12-13 ENCOUNTER — Ambulatory Visit
Admission: RE | Admit: 2018-12-13 | Discharge: 2018-12-13 | Disposition: A | Discharge status: Home / Self Care | Payer: BC Managed Care – PPO | Source: Ambulatory Visit | Attending: Family Medicine | Admitting: Family Medicine

## 2018-12-13 ENCOUNTER — Ambulatory Visit
Admission: RE | Admit: 2018-12-13 | Discharge: 2018-12-13 | Disposition: A | Discharge status: Home / Self Care | Payer: BC Managed Care – PPO | Attending: Family Medicine | Admitting: Family Medicine

## 2018-12-13 ENCOUNTER — Ambulatory Visit (INDEPENDENT_AMBULATORY_CARE_PROVIDER_SITE_OTHER): Payer: BC Managed Care – PPO | Admitting: Family Medicine

## 2018-12-13 ENCOUNTER — Other Ambulatory Visit: Payer: Self-pay

## 2018-12-13 ENCOUNTER — Encounter: Payer: Self-pay | Admitting: Family Medicine

## 2018-12-13 VITALS — BP 150/74 | HR 65 | Temp 97.1°F | Ht 63.0 in | Wt 174.9 lb

## 2018-12-13 DIAGNOSIS — F5104 Psychophysiologic insomnia: Secondary | ICD-10-CM | POA: Diagnosis not present

## 2018-12-13 DIAGNOSIS — J302 Other seasonal allergic rhinitis: Secondary | ICD-10-CM | POA: Diagnosis not present

## 2018-12-13 DIAGNOSIS — R05 Cough: Secondary | ICD-10-CM | POA: Insufficient documentation

## 2018-12-13 DIAGNOSIS — R062 Wheezing: Secondary | ICD-10-CM

## 2018-12-13 DIAGNOSIS — I10 Essential (primary) hypertension: Secondary | ICD-10-CM

## 2018-12-13 DIAGNOSIS — E7800 Pure hypercholesterolemia, unspecified: Secondary | ICD-10-CM | POA: Diagnosis not present

## 2018-12-13 DIAGNOSIS — R053 Chronic cough: Secondary | ICD-10-CM

## 2018-12-13 DIAGNOSIS — M170 Bilateral primary osteoarthritis of knee: Secondary | ICD-10-CM | POA: Diagnosis not present

## 2018-12-13 DIAGNOSIS — R739 Hyperglycemia, unspecified: Secondary | ICD-10-CM

## 2018-12-13 DIAGNOSIS — M5441 Lumbago with sciatica, right side: Secondary | ICD-10-CM

## 2018-12-13 DIAGNOSIS — Z23 Encounter for immunization: Secondary | ICD-10-CM | POA: Diagnosis not present

## 2018-12-13 DIAGNOSIS — G8929 Other chronic pain: Secondary | ICD-10-CM

## 2018-12-13 DIAGNOSIS — G4726 Circadian rhythm sleep disorder, shift work type: Secondary | ICD-10-CM

## 2018-12-13 MED ORDER — AMLODIPINE BESYLATE 10 MG PO TABS: 10.0000 mg | ORAL_TABLET | Freq: Every day | ORAL | 1 refills | Status: DC

## 2018-12-13 MED ORDER — TRAMADOL HCL 50 MG PO TABS: 50.0000 mg | ORAL_TABLET | Freq: Two times a day (BID) | ORAL | 2 refills | Status: DC | PRN

## 2018-12-13 MED ORDER — ZOLPIDEM TARTRATE ER 12.5 MG PO TBCR: 12.5000 mg | EXTENDED_RELEASE_TABLET | Freq: Every day | ORAL | 0 refills | Status: DC

## 2018-12-13 MED ORDER — MONTELUKAST SODIUM 10 MG PO TABS: 10.0000 mg | ORAL_TABLET | Freq: Every day | ORAL | 0 refills | Status: DC

## 2018-12-13 MED ORDER — PRAVASTATIN SODIUM 40 MG PO TABS: 40.0000 mg | ORAL_TABLET | Freq: Every day | ORAL | 1 refills | Status: DC

## 2018-12-13 MED ORDER — VALSARTAN-HYDROCHLOROTHIAZIDE 160-12.5 MG PO TABS: 1.0000 | ORAL_TABLET | Freq: Every day | ORAL | 1 refills | Status: DC

## 2018-12-13 NOTE — Progress Notes (Signed)
Name: Hayley Howe   MRN: [id number]    DOB: 1946-04-15   Date:12/13/2018       Progress Note  Subjective  Chief Complaint  Chief Complaint  Patient presents with   Hypertension    3 month recheck   Hyperlipidemia   Medication Refill    I connected with  [name] on 12/13/18 at  8:20 AM EDT by telephone and verified that I am speaking with the correct person using two identifiers.  I discussed the limitations, risks, security and privacy concerns of performing an evaluation and management service by telephone and the availability of in person appointments. Staff also discussed with the patient that there may be a patient responsible charge related to this service. Patient Location: in the car with sister and grandson  Provider Location: [facility]  HPI  Cough: she was seen March 2020 and we stopped the ACE and gave her diovan hctz, she states cough improved but never resolved. She states it was also productive but now is dry. She smoked many years ago but quit in her 37's . She never got the [facility] because of the cost, she has been taking Singulair, but continues to have wheezing at night and a dry cough, discussed referral to pulmonologist and CXR . The cough has been going on for a long time now, since last winter, and she thought it was allergies   Hyperlipidemia: taking pravastatin and denies myalgias; last Lipids03/2019 and LDL was 90,no chest pain or decrease in exercise tolerance, no shortness of breath . Advised to resume Aspirin 81 mg daily because ASCVD risk 11.4% but risk of bleeding is only 2.3%.   Chronic Low Back: she has a long history of low back pain, not secondary to trauma, DDD lumbar spineand also scoliosis.taking Tramadol daily to control symptoms(takes 1 daily, takes 2nd tablets a few times a week), she is also on Skelaxin prn - she states has not taken one in about one month.  Unable to tolerate Flexeril - it makes her sleepy. .  Pain is constant but worse after work. Occasionally the pain radiates to bilateralthighs,and when radiating it is described as numbness that is seldom maybe a couple of times a month. Pain at this time is 4/10. She goes to chiropractor( Dr. Genoveva Ill a month andis stable.  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 She sleepsfrom around2pm-8:30pmfor about 5-7hours when she sleeps during the day and about 5 hours during the night.Taking Ambien, denies side effects of medication, she is aware of FDA guidelines however unable to sleep with lower dose of Ambien.She wants to continue medication , she usually skips twice weekly   OA: she has aching pain on left knee, s/p left knee replaced, only triggered by kneeling down, surgery was done by Dr. Rudene Christians in 2012, no recent visits with him, she states she is doing well at this time. She is not taking otc medications   Hyperglycemia: last hgbA1C was 5.9%, denies polyphagia, polydipsia or polyuria. Trying to eat healthy  AR: seasonal She has occasional rhinorrhea and sometimes nasal congestion, but mild.   HTN: taking valsartan hctz, since we stopped lisinopril and is doing well, no side effects, no chest pain or palpitation. She was not able to check bp at home today   Patient Active Problem List   Diagnosis Date Noted   Hyperglycemia 07/13/2017   Chronic constipation 12/15/2016   Primary osteoarthritis of left hip 11/05/2015  Scoliosis 11/05/2015   Chronic low back pain 10/30/2014   Benign essential HTN 10/27/2014   Bradycardia 10/27/2014   Insomnia 10/27/2014   Headache, temporal 10/27/2014   Hypercholesteremia 10/27/2014   Obesity (BMI 30-39.9) 10/27/2014   Changing sleep-work schedule, affecting sleep 10/27/2014   Vitamin D deficiency 10/27/2014   Arthritis of knee, degenerative 09/24/2009    Past Surgical History:  Procedure Laterality Date   ABDOMINAL  HYSTERECTOMY     BREAST BIOPSY Right 01/21/2013   stereo - benign   COLONOSCOPY WITH PROPOFOL N/A 06/30/2016   Procedure: COLONOSCOPY WITH PROPOFOL;  Surgeon: Jonathon Bellows, MD;  Location: [facility] ENDOSCOPY;  Service: Endoscopy;  Laterality: N/A;   KNEE SURGERY Left 2012   Dr. 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[employer] resource strain: Not hard at all   Food insecurity    Worry: Never true    Inability: Never true   Transportation needs    Medical: No    Non-medical: No  Tobacco Use   Smoking status: Former Smoker    Packs/day: 0.25    Years: 1.00    Pack years: 0.25    Types: Cigarettes    Start date: 08/13/1967    Quit date: 04/18/1977    Years since quitting: 41.6   Smokeless tobacco: Never Used   Tobacco comment: smoking cessation materials not required  Substance and Sexual Activity   Alcohol use: No    Alcohol/week: 0.0 standard drinks   Drug use: No   Sexual activity: Not Currently  Lifestyle   Physical activity    Days per week: 0 days    Minutes per session: 0 min   Stress: Not at all  Relationships   Social connections    Talks on phone: More than three times a week    Gets together: Twice a week    Attends religious service: More than 4 times per year    Active member of club or organization: Yes    Attends meetings of clubs or organizations: More than 4  times per year    Relationship status: Widowed   Intimate partner violence    Fear of current or ex partner: No    Emotionally abused: No    Physically abused: No    Forced sexual activity: No  Other Topics Concern   Not on file  Social History Narrative   Daughter lives with her   Still working full time at [employer]      Mother of Torre Shutes     Current Outpatient Medications:    amLODipine (NORVASC) 10 MG tablet, Take 1 tablet (10 mg total) by mouth daily.,  Disp: 90 tablet, Rfl: 1   aspirin EC 81 MG tablet, Take 1 tablet (81 mg total) by mouth daily., Disp: 30 tablet, Rfl: 0   Cholecalciferol (VITAMIN D) 2000 UNITS tablet, Take 1 tablet by mouth daily., Disp: , Rfl:    fluticasone (FLONASE) 50 MCG/ACT nasal spray, Use 2 spray(s) in each nostril once daily, Disp: 16 g, Rfl: 0   loratadine (CLARITIN) 10 MG tablet, Take 1 tablet (10 mg total) by mouth daily., Disp: 30 tablet, Rfl: 0   Magnesium 250 MG TABS, Take 1 tablet by mouth daily., Disp: , Rfl:    montelukast (SINGULAIR) 10 MG tablet, Take 1 tablet (10 mg total) by mouth at bedtime., Disp: 90 tablet, Rfl: 0   pravastatin (PRAVACHOL) 40 MG tablet, Take 1 tablet (40 mg total) by mouth daily., Disp: 90 tablet, Rfl: 1   traMADol (ULTRAM) 50 MG tablet, Take 1 tablet (50 mg total) by mouth 2 (two) times daily as needed. Fill 01/01/2019 and monthly thereafter, Disp: 60 tablet, Rfl: 2   valsartan-hydrochlorothiazide (DIOVAN-HCT) 160-12.5 MG tablet, Take 1 tablet by mouth daily., Disp: 90 tablet, Rfl: 1   vitamin C (ASCORBIC ACID) 500 MG tablet, Take 1 tablet by mouth daily., Disp: , Rfl:    zolpidem (AMBIEN CR) 12.5 MG CR tablet, Take 1 tablet (12.5 mg total) by mouth at bedtime., Disp: 90 tablet, Rfl: 0   metaxalone (SKELAXIN) 800 MG tablet, Take 1 tablet (800 mg total) by mouth 3 (three) times daily as needed for muscle spasms. (Patient not taking: Reported on 12/13/2018), Disp: 90 tablet, Rfl: 1  No Known Allergies  I  personally reviewed active problem list, medication list, allergies, family history, social history, health maintenance with the patient/caregiver today.   ROS  Ten systems reviewed and is negative except as mentioned in HPI   Objective  Virtual encounter  Vitals:   12/13/18 0936  BP: (!) 150/74  Pulse: 65  Temp: (!) 97.1 F (36.2 C)  SpO2: 99%    Body mass index is 30.98 kg/m.  Physical Exam  Awake, alert and oriented  PHQ2/9: Depression screen [facility] 2/9 12/13/2018 07/26/2018 06/22/2018 06/08/2018 01/19/2018  Decreased Interest 0 0 0 0 0  Down, Depressed, Hopeless 0 0 0 0 0  PHQ - 2 Score 0 0 0 0 0  Altered sleeping 0 0 0 - 0  Tired, decreased energy 0 0 0 - 0  Change in appetite 0 0 0 - 0  Feeling bad or failure about yourself  0 0 0 - 0  Trouble concentrating 0 0 0 - 0  Moving slowly or fidgety/restless 0 0 0 - 0  Suicidal thoughts 0 0 0 - 0  PHQ-9 Score 0 0 0 - 0  Difficult doing work/chores Not difficult at all - - - Not difficult at all   PHQ-2/9 Result is negative.    Fall Risk: Fall Risk  12/13/2018 07/26/2018 06/22/2018 06/08/2018 01/19/2018  Falls in the past year? 0 0 0 0 No  Number falls in past yr: 0 0 0 0 -  Injury with Fall? 0 0 0 0 -  Risk for fall due to : - - - - Impaired vision  Risk for fall due to: Comment - - - - wears eyeglasses  Follow up Falls evaluation completed - - - -     Assessment & Plan  1. Benign essential HTN  - amLODipine (NORVASC) 10 MG tablet; Take 1 tablet (10 mg total) by mouth daily.  Dispense:  90 tablet; Refill: 1 - valsartan-hydrochlorothiazide (DIOVAN-HCT) 160-12.5 MG tablet; Take 1 tablet by mouth daily.  Dispense: 90 tablet; Refill: 1 - COMPLETE METABOLIC PANEL WITH GFR - CBC with Differential/Platelet  2. Wheezing  - montelukast (SINGULAIR) 10 MG tablet; Take 1 tablet (10 mg total) by mouth at bedtime.  Dispense: 90 tablet; Refill: 0  3. Seasonal allergic rhinitis, unspecified trigger  - montelukast (SINGULAIR) 10 MG  tablet; Take 1 tablet (10 mg total) by mouth at bedtime.  Dispense: 90 tablet; Refill: 0  4. Hypercholesteremia  - pravastatin (PRAVACHOL) 40 MG tablet; Take 1 tablet (40 mg total) by mouth daily.  Dispense: 90 tablet; Refill: 1 - Lipid panel  5. Primary osteoarthritis of both knees  - traMADol (ULTRAM) 50 MG tablet; Take 1 tablet (50 mg total) by mouth 2 (two) times daily as needed. Fill 01/01/2019 and monthly thereafter  Dispense: 60 tablet; Refill: 2  6. Chronic right-sided low back pain with right-sided sciatica  - traMADol (ULTRAM) 50 MG tablet; Take 1 tablet (50 mg total) by mouth 2 (two) times daily as needed. Fill 01/01/2019 and monthly thereafter  Dispense: 60 tablet; Refill: 2  7. Chronic insomnia  - zolpidem (AMBIEN CR) 12.5 MG CR tablet; Take 1 tablet (12.5 mg total) by mouth at bedtime.  Dispense: 90 tablet; Refill: 0  8. Changing sleep-work schedule, affecting sleep  - zolpidem (AMBIEN CR) 12.5 MG CR tablet; Take 1 tablet (12.5 mg total) by mouth at bedtime.  Dispense: 90 tablet; Refill: 0  9. Chronic cough  - DG Chest 2 View; Future - CBC with Differential/Platelet  10. Needs flu shot  - Flu Vaccine QUAD High Dose(Fluad)  11. Hyperglycemia  - Hemoglobin A1c   The patient was advised to call back or seek an in-person evaluation if the symptoms worsen or if the condition fails to improve as anticipated.  I provided 25 minutes of non-face-to-face time during this encounter.  Loistine Chance, MD

## 2018-12-14 LAB — COMPLETE METABOLIC PANEL WITH GFR
AG Ratio: 1.2 (calc) (ref 1.0–2.5)
ALT: 21 U/L (ref 6–29)
AST: 24 U/L (ref 10–35)
Albumin: 4 g/dL (ref 3.6–5.1)
Alkaline phosphatase (APISO): 76 U/L (ref 37–153)
BUN: 19 mg/dL (ref 7–25)
CO2: 28 mmol/L (ref 20–32)
Calcium: 9.7 mg/dL (ref 8.6–10.4)
Chloride: 103 mmol/L (ref 98–110)
Creat: 0.86 mg/dL (ref 0.60–0.93)
GFR, Est African American: 79 mL/min/{1.73_m2} (ref >=60)
GFR, Est Non African American: 68 mL/min/{1.73_m2} (ref >=60)
Globulin: 3.3 g/dL (calc) (ref 1.9–3.7)
Glucose, Bld: 89 mg/dL (ref 65–99)
Potassium: 4.1 mmol/L (ref 3.5–5.3)
Sodium: 138 mmol/L (ref 135–146)
Total Bilirubin: 0.3 mg/dL (ref 0.2–1.2)
Total Protein: 7.3 g/dL (ref 6.1–8.1)

## 2018-12-14 LAB — HEMOGLOBIN A1C
Hgb A1c MFr Bld: 6 % of total Hgb — ABNORMAL HIGH (ref <5.7)
Mean Plasma Glucose: 126 (calc)
eAG (mmol/L): 7 (calc)

## 2018-12-14 LAB — CBC WITH DIFFERENTIAL/PLATELET
Absolute Monocytes: 380 cells/uL (ref 200–950)
Basophils Absolute: 77 cells/uL (ref 0–200)
Basophils Relative: 1.4 %
Eosinophils Absolute: 479 cells/uL (ref 15–500)
Eosinophils Relative: 8.7 %
HCT: 37.2 % (ref 35.0–45.0)
Hemoglobin: 11.9 g/dL (ref 11.7–15.5)
Lymphs Abs: 2250 cells/uL (ref 850–3900)
MCH: 26.6 pg — ABNORMAL LOW (ref 27.0–33.0)
MCHC: 32 g/dL (ref 32.0–36.0)
MCV: 83 fL (ref 80.0–100.0)
MPV: 10.5 fL (ref 7.5–12.5)
Monocytes Relative: 6.9 %
Neutro Abs: 2316 cells/uL (ref 1500–7800)
Neutrophils Relative %: 42.1 %
Platelets: 301 10*3/uL (ref 140–400)
RBC: 4.48 10*6/uL (ref 3.80–5.10)
RDW: 14 % (ref 11.0–15.0)
Total Lymphocyte: 40.9 %
WBC: 5.5 10*3/uL (ref 3.8–10.8)

## 2018-12-14 LAB — LIPID PANEL
Cholesterol: 218 mg/dL — ABNORMAL HIGH (ref <200)
HDL: 102 mg/dL (ref >=50)
LDL Cholesterol (Calc): 102 mg/dL (calc) — ABNORMAL HIGH
Non-HDL Cholesterol (Calc): 116 mg/dL (calc) (ref <130)
Total CHOL/HDL Ratio: 2.1 (calc) (ref <5.0)
Triglycerides: 48 mg/dL (ref <150)

## 2018-12-22 ENCOUNTER — Other Ambulatory Visit: Payer: Self-pay | Admitting: Family Medicine

## 2018-12-22 DIAGNOSIS — J301 Allergic rhinitis due to pollen: Secondary | ICD-10-CM

## 2018-12-22 DIAGNOSIS — H6993 Unspecified Eustachian tube disorder, bilateral: Secondary | ICD-10-CM

## 2019-01-24 ENCOUNTER — Other Ambulatory Visit: Payer: Self-pay | Admitting: Family Medicine

## 2019-01-24 ENCOUNTER — Other Ambulatory Visit: Payer: Self-pay

## 2019-01-24 ENCOUNTER — Ambulatory Visit (INDEPENDENT_AMBULATORY_CARE_PROVIDER_SITE_OTHER): Payer: BC Managed Care – PPO

## 2019-01-24 VITALS — BP 142/82 | HR 70 | Temp 97.1°F | Resp 16 | Ht 63.0 in | Wt 175.9 lb

## 2019-01-24 DIAGNOSIS — Z0000 Encounter for general adult medical examination without abnormal findings: Secondary | ICD-10-CM

## 2019-01-24 DIAGNOSIS — R053 Chronic cough: Secondary | ICD-10-CM

## 2019-01-24 DIAGNOSIS — Z1231 Encounter for screening mammogram for malignant neoplasm of breast: Secondary | ICD-10-CM

## 2019-01-24 DIAGNOSIS — R05 Cough: Secondary | ICD-10-CM

## 2019-01-24 DIAGNOSIS — R9389 Abnormal findings on diagnostic imaging of other specified body structures: Secondary | ICD-10-CM

## 2019-01-24 NOTE — Progress Notes (Addendum)
Subjective:   Hayley Howe is a 73 y.o. female who presents for Medicare Annual (Subsequent) preventive examination.  Review of Systems:   Cardiac Risk Factors include: advanced age (>27men, >37 women);dyslipidemia;hypertension;obesity (BMI >30kg/m2)     Objective:     Vitals: BP (!) 142/82 (BP Location: Left Arm, Patient Position: Sitting, Cuff Size: Normal)   Pulse 70   Temp (!) 97.1 F (36.2 C) (Temporal)   Resp 16   Ht 5\' 3"  (1.6 m)   Wt 175 lb 14.4 oz (79.8 kg)   SpO2 99%   BMI 31.16 kg/m   Body mass index is 31.16 kg/m.  Advanced Directives 01/24/2019 01/19/2018 12/15/2016 08/12/2016 06/30/2016 06/02/2016 01/20/2016  Does Patient Have a Medical Advance Directive? Yes Yes No Yes Yes Yes Yes  Type of Paramedic of [place];Living will [place];Living will - [place];Living will Living will;Healthcare Power of [place];Living will [place];Living will  Does patient want to make changes to medical advance directive? - - - - - - No - Patient declined  Copy of [place] in Chart? No - copy requested No - copy requested - No - copy requested No - copy requested - No - copy requested    Tobacco Social History   Tobacco Use  Smoking Status Former Smoker  . Packs/day: 0.25  . Years: 1.00  . Pack years: 0.25  . Types: Cigarettes  . Start date: 08/13/1967  . Quit date: 04/18/1977  . Years since quitting: 41.7  Smokeless Tobacco Never Used  Tobacco Comment   smoking cessation materials not required     Counseling given: Not Answered Comment: smoking cessation materials not required   Clinical Intake:  Pre-visit preparation completed: Yes  Pain : No/denies pain     BMI - recorded: 31.16 Nutritional Status: BMI > 30  Obese Nutritional Risks: None Diabetes: No  How often do you need to have someone help you when you read instructions,  pamphlets, or other written materials from your doctor or pharmacy?: 1 - Never  Interpreter Needed?: No  Information entered by :: Clemetine Marker LPN  Past Medical History:  Diagnosis Date  . Hyperlipidemia   . Hypertension   . Insomnia   . Low back pain   . Obesity   . Osteoarthritis of left knee    Past Surgical History:  Procedure Laterality Date  . ABDOMINAL HYSTERECTOMY    . BREAST BIOPSY Right 01/21/2013   stereo - benign  . COLONOSCOPY WITH PROPOFOL N/A 06/30/2016   Procedure: COLONOSCOPY WITH PROPOFOL;  Surgeon: Jonathon Bellows, MD;  Location: [facility] ENDOSCOPY;  Service: Endoscopy;  Laterality: N/A;  . KNEE SURGERY Left 2012   Dr. Rudene Christians  . OOPHORECTOMY    . TUBAL LIGATION    . VAGINAL PROLAPSE REPAIR     Family History  Problem Relation Age of Onset  . Breast cancer Sister 31       in year 2013  . Cancer Sister   . Hyperlipidemia Mother   . Diabetes Mother   . Cancer Father        Leukemia  . Hyperlipidemia Father   . Cancer Brother        Lung  . Cancer Sister    Social History   Socioeconomic History  . Marital status: Widowed    Spouse name: Not on file  . Number of children: 2  . Years of education: Not  on file  . Highest education level: 12th grade  Occupational History  . Occupation: Education officer, museum: [employer]  . Financial resource strain: Not hard at all  . Food insecurity    Worry: Never true    Inability: Never true  . Transportation needs    Medical: No    Non-medical: No  Tobacco Use  . Smoking status: Former Smoker    Packs/day: 0.25    Years: 1.00    Pack years: 0.25    Types: Cigarettes    Start date: 08/13/1967    Quit date: 04/18/1977    Years since quitting: 41.7  . Smokeless tobacco: Never Used  . Tobacco comment: smoking cessation materials not required  Substance and Sexual Activity  . Alcohol use: No    Alcohol/week: 0.0 standard drinks  . Drug use: No  . Sexual activity: Not Currently  Lifestyle  . Physical  activity    Days per week: 0 days    Minutes per session: 0 min  . Stress: Not at all  Relationships  . Social connections    Talks on phone: More than three times a week    Gets together: Twice a week    Attends religious service: More than 4 times per year    Active member of club or organization: Yes    Attends meetings of clubs or organizations: More than 4 times per year    Relationship status: Widowed  Other Topics Concern  . Not on file  Social History Narrative   Daughter lives with her and grandson   Still working full time at [employer] 3rd shift      Mother of Symora Stathis    Outpatient Encounter Medications as of 01/24/2019  Medication Sig  . amLODipine (NORVASC) 10 MG tablet Take 1 tablet (10 mg total) by mouth daily.  Marland Kitchen aspirin EC 81 MG tablet Take 1 tablet (81 mg total) by mouth daily.  . Cholecalciferol (VITAMIN D) 2000 UNITS tablet Take 1 tablet by mouth daily.  . fluticasone (FLONASE) 50 MCG/ACT nasal spray Use 2 spray(s) in each nostril once daily  . loratadine (CLARITIN) 10 MG tablet Take 1 tablet (10 mg total) by mouth daily.  . Magnesium 250 MG TABS Take 1 tablet by mouth daily.  . montelukast (SINGULAIR) 10 MG tablet Take 1 tablet (10 mg total) by mouth at bedtime.  . pravastatin (PRAVACHOL) 40 MG tablet Take 1 tablet (40 mg total) by mouth daily.  . traMADol (ULTRAM) 50 MG tablet Take 1 tablet (50 mg total) by mouth 2 (two) times daily as needed. Fill 01/01/2019 and monthly thereafter  . valsartan-hydrochlorothiazide (DIOVAN-HCT) 160-12.5 MG tablet Take 1 tablet by mouth daily.  . vitamin C (ASCORBIC ACID) 500 MG tablet Take 1 tablet by mouth daily.  Marland Kitchen zolpidem (AMBIEN CR) 12.5 MG CR tablet Take 1 tablet (12.5 mg total) by mouth at bedtime.  . [DISCONTINUED] metaxalone (SKELAXIN) 800 MG tablet Take 1 tablet (800 mg total) by mouth 3 (three) times daily as needed for muscle spasms. (Patient not taking: Reported on 12/13/2018)   No facility-administered encounter  medications on file as of 01/24/2019.     Activities of Daily Living In your present state of health, do you have any difficulty performing the following activities: 01/24/2019 06/08/2018  Hearing? N N  Comment declines hearing aids -  Vision? N N  Difficulty concentrating or making decisions? N N  Walking or climbing stairs? N N  Dressing or bathing? N N  Doing errands, shopping? N N  Preparing Food and eating ? N -  Using the Toilet? N -  In the past six months, have you accidently leaked urine? N -  Do you have problems with loss of bowel control? N -  Managing your Medications? N -  Managing your Finances? N -  Housekeeping or managing your Housekeeping? N -  Some recent data might be hidden    Patient Care Team: Steele Sizer, MD as PCP - General (Family Medicine) Grant Fontana, [place] as Consulting Physician (Chiropractic Medicine)    Assessment:   This is a routine wellness examination for Teagan.  Exercise Activities and Dietary recommendations Current Exercise Habits: The patient does not participate in regular exercise at present, Exercise limited by: respiratory conditions(s)  Goals    . DIET - REDUCE FAT INTAKE     Recommend to eat 3 small healthy meals and at least 2 healthy snacks per day.        Fall Risk Fall Risk  01/24/2019 12/13/2018 07/26/2018 06/22/2018 06/08/2018  Falls in the past year? 0 0 0 0 0  Number falls in past yr: 0 0 0 0 0  Injury with Fall? 0 0 0 0 0  Risk for fall due to : - - - - -  Risk for fall due to: Comment - - - - -  Follow up Falls prevention discussed Falls evaluation completed - - -   FALL RISK PREVENTION PERTAINING TO THE HOME:  Any stairs in or around the home? Yes  If so, do they handrails? Yes   Home free of loose throw rugs in walkways, pet beds, electrical cords, etc? Yes  Adequate lighting in your home to reduce risk of falls? Yes   ASSISTIVE DEVICES UTILIZED TO PREVENT FALLS:  Life alert? No  Use of a cane, walker or  w/c? No  Grab bars in the bathroom? No  Shower chair or bench in shower? No  Elevated toilet seat or a handicapped toilet? No   DME ORDERS:  DME order needed?  No   TIMED UP AND GO:  Was the test performed? Yes .  Length of time to ambulate 10 feet: 5 sec.   GAIT:  Appearance of gait: Gait stead-fast and without the use of an assistive device.   Education: Fall risk prevention has been discussed.  Intervention(s) required? No    Depression Screen PHQ 2/9 Scores 01/24/2019 12/13/2018 07/26/2018 06/22/2018  PHQ - 2 Score 0 0 0 0  PHQ- 9 Score - 0 0 0     Cognitive Function     6CIT Screen 01/24/2019 01/19/2018  What Year? 0 points 0 points  What month? 0 points 0 points  What time? 0 points 0 points  Count back from 20 0 points 0 points  Months in reverse 0 points 2 points  Repeat phrase 0 points 4 points  Total Score 0 6    Immunization History  Administered Date(s) Administered  . Fluad Quad(high Dose 65+) 12/13/2018  . Influenza, High Dose Seasonal PF 01/20/2016, 12/15/2016, 01/19/2018  . Influenza, Seasonal, Injecte, Preservative Fre 02/18/2010, 02/11/2011, 03/29/2012  . Influenza,inj,Quad PF,6+ Mos 01/03/2013, 01/10/2014, 03/05/2015  . Influenza-Unspecified 12/17/2013  . Pneumococcal Conjugate-13 06/19/2014  . Pneumococcal Polysaccharide-23 02/18/2010, 07/02/2015  . Td 08/12/2016  . Tdap 09/14/2007  . Zoster 08/20/2015    Qualifies for Shingles Vaccine? Yes  Zostavax completed 2017. Due for Shingrix. Education has been provided regarding the importance  of this vaccine. Pt has been advised to call insurance company to determine out of pocket expense. Advised may also receive vaccine at local pharmacy or Health Dept. Verbalized acceptance and understanding.  Tdap: Up to date  Flu Vaccine: Up to date  Pneumococcal Vaccine: Up to date   Screening Tests Health Maintenance  Topic Date Due  . MAMMOGRAM  03/23/2019  . COLONOSCOPY  06/30/2021  . TETANUS/TDAP   08/13/2026  . INFLUENZA VACCINE  Completed  . DEXA SCAN  Completed  . Hepatitis C Screening  Completed  . PNA vac Low Risk Adult  Completed    Cancer Screenings:  Colorectal Screening: Completed 06/30/16. Repeat every 5 years;   Mammogram: Completed 03/22/18. Repeat every year. Ordered today. Pt provided with contact information and advised to call to schedule appt.   Bone Density: Completed 03/22/18. Results reflect NORMAL. Repeat every 2 years. .   Lung Cancer Screening: (Low Dose CT Chest recommended if Age 75-80 years, 30 pack-year currently smoking OR have quit w/in 15years.) does not qualify based on smoking history but patient is being referred to pulmonology for chronic cough and wheezing.    Additional Screening:  Hepatitis C Screening: does qualify; Completed 03/29/12  Vision Screening: Recommended annual ophthalmology exams for early detection of glaucoma and other disorders of the eye. Is the patient up to date with their annual eye exam?  No  Who is the provider or what is the name of the office in which the pt attends annual eye exams? [facility] in [place]: Recommended annual dental exams for proper oral hygiene  Community Resource Referral:  CRR required this visit?  No       Plan:     I have personally reviewed and addressed the Medicare Annual Wellness questionnaire and have noted the following in the patient's chart:  A. Medical and social history B. Use of alcohol, tobacco or illicit drugs  C. Current medications and supplements D. Functional ability and status E.  Nutritional status F.  Physical activity G. Advance directives H. List of other physicians I.  Hospitalizations, surgeries, and ER visits in previous 12 months J.  [place] such as hearing and vision if needed, cognitive and depression L. Referrals and appointments   In addition, I have reviewed and discussed with patient certain preventive  protocols, quality metrics, and best practice recommendations. A written personalized care plan for preventive services as well as general preventive health recommendations were provided to patient.   Signed,  Clemetine Marker, LPN Nurse Health Advisor   Nurse Notes: pt states she is still experiencing chronic cough and wheezing. She is awaiting chest CT and referral to pulmonology. She has no preference for which to do first. Thank you!

## 2019-01-24 NOTE — Patient Instructions (Signed)
[name] , Thank you for taking time to come for your Medicare Wellness Visit. I appreciate your ongoing commitment to your health goals. Please review the following plan we discussed and let me know if I can assist you in the future.   Screening recommendations/referrals: Colonoscopy: done 06/30/16. Repeat in 2023. Mammogram: done 03/22/18. Please call [phone] to schedule your mammogram.  Bone Density: done 03/22/18 Recommended yearly ophthalmology/optometry visit for glaucoma screening and checkup Recommended yearly dental visit for hygiene and checkup  Vaccinations: Influenza vaccine: done 12/13/18 Pneumococcal vaccine: done 07/02/15 Tdap vaccine: done 08/12/16 Shingles vaccine: Shingrix discussed. Please contact your pharmacy for coverage information.   Advanced directives: Please bring a copy of your health care power of attorney and living will to the office at your convenience.  Conditions/risks identified: Recommend increasing physical activity as tolerated.   Next appointment: Please follow up in one year for your Medicare Annual Wellness visit.     Preventive Care 71 Years and Older, Female Preventive care refers to lifestyle choices and visits with your health care provider that can promote health and wellness. What does preventive care include?  A yearly physical exam. This is also called an annual well check.  Dental exams once or twice a year.  Routine eye exams. Ask your health care provider how often you should have your eyes checked.  Personal lifestyle choices, including:  Daily care of your teeth and gums.  Regular physical activity.  Eating a healthy diet.  Avoiding tobacco and drug use.  Limiting alcohol use.  Practicing safe sex.  Taking low-dose aspirin every day.  Taking vitamin and mineral supplements as recommended by your health care provider. What happens during an annual well check? The services and screenings done by your health care  provider during your annual well check will depend on your age, overall health, lifestyle risk factors, and family history of disease. Counseling  Your health care provider may ask you questions about your:  Alcohol use.  Tobacco use.  Drug use.  Emotional well-being.  Home and relationship well-being.  Sexual activity.  Eating habits.  History of falls.  Memory and ability to understand (cognition).  Work and work Statistician.  Reproductive health. Screening  You may have the following tests or measurements:  Height, weight, and BMI.  Blood pressure.  Lipid and cholesterol levels. These may be checked every 5 years, or more frequently if you are over 76 years old.  Skin check.  Lung cancer screening. You may have this screening every year starting at age 32 if you have a 30-pack-year history of smoking and currently smoke or have quit within the past 15 years.  Fecal occult blood test (FOBT) of the stool. You may have this test every year starting at age 76.  Flexible sigmoidoscopy or colonoscopy. You may have a sigmoidoscopy every 5 years or a colonoscopy every 10 years starting at age 67.  Hepatitis C blood test.  Hepatitis B blood test.  Sexually transmitted disease (STD) testing.  Diabetes screening. This is done by checking your blood sugar (glucose) after you have not eaten for a while (fasting). You may have this done every 1-3 years.  Bone density scan. This is done to screen for osteoporosis. You may have this done starting at age 60.  Mammogram. This may be done every 1-2 years. Talk to your health care provider about how often you should have regular mammograms. Talk with your health care provider about your test results, treatment options, and  if necessary, the need for more tests. Vaccines  Your health care provider may recommend certain vaccines, such as:  Influenza vaccine. This is recommended every year.  Tetanus, diphtheria, and acellular  pertussis (Tdap, Td) vaccine. You may need a Td booster every 10 years.  Zoster vaccine. You may need this after age 40.  Pneumococcal 13-valent conjugate (PCV13) vaccine. One dose is recommended after age 57.  Pneumococcal polysaccharide (PPSV23) vaccine. One dose is recommended after age 3. Talk to your health care provider about which screenings and vaccines you need and how often you need them. This information is not intended to replace advice given to you by your health care provider. Make sure you discuss any questions you have with your health care provider. Document Released: 05/01/2015 Document Revised: 12/23/2015 Document Reviewed: 02/03/2015 Elsevier Interactive Patient Education  2017 [place] Prevention in the Home Falls can cause injuries. They can happen to people of all ages. There are many things you can do to make your home safe and to help prevent falls. What can I do on the outside of my home?  Regularly fix the edges of walkways and driveways and fix any cracks.  Remove anything that might make you trip as you walk through a door, such as a raised step or threshold.  Trim any bushes or trees on the path to your home.  Use bright outdoor lighting.  Clear any walking paths of anything that might make someone trip, such as rocks or tools.  Regularly check to see if handrails are loose or broken. Make sure that both sides of any steps have handrails.  Any raised decks and porches should have guardrails on the edges.  Have any leaves, snow, or ice cleared regularly.  Use sand or salt on walking paths during winter.  Clean up any spills in your garage right away. This includes oil or grease spills. What can I do in the bathroom?  Use night lights.  Install grab bars by the toilet and in the tub and shower. Do not use towel bars as grab bars.  Use non-skid mats or decals in the tub or shower.  If you need to sit down in the shower, use a plastic,  non-slip stool.  Keep the floor dry. Clean up any water that spills on the floor as soon as it happens.  Remove soap buildup in the tub or shower regularly.  Attach bath mats securely with double-sided non-slip rug tape.  Do not have throw rugs and other things on the floor that can make you trip. What can I do in the bedroom?  Use night lights.  Make sure that you have a light by your bed that is easy to reach.  Do not use any sheets or blankets that are too big for your bed. They should not hang down onto the floor.  Have a firm chair that has side arms. You can use this for support while you get dressed.  Do not have throw rugs and other things on the floor that can make you trip. What can I do in the kitchen?  Clean up any spills right away.  Avoid walking on wet floors.  Keep items that you use a lot in easy-to-reach places.  If you need to reach something above you, use a strong step stool that has a grab bar.  Keep electrical cords out of the way.  Do not use floor polish or wax that makes floors slippery. If you  must use wax, use non-skid floor wax.  Do not have throw rugs and other things on the floor that can make you trip. What can I do with my stairs?  Do not leave any items on the stairs.  Make sure that there are handrails on both sides of the stairs and use them. Fix handrails that are broken or loose. Make sure that handrails are as long as the stairways.  Check any carpeting to make sure that it is firmly attached to the stairs. Fix any carpet that is loose or worn.  Avoid having throw rugs at the top or bottom of the stairs. If you do have throw rugs, attach them to the floor with carpet tape.  Make sure that you have a light switch at the top of the stairs and the bottom of the stairs. If you do not have them, ask someone to add them for you. What else can I do to help prevent falls?  Wear shoes that:  Do not have high heels.  Have rubber  bottoms.  Are comfortable and fit you well.  Are closed at the toe. Do not wear sandals.  If you use a stepladder:  Make sure that it is fully opened. Do not climb a closed stepladder.  Make sure that both sides of the stepladder are locked into place.  Ask someone to hold it for you, if possible.  Clearly mark and make sure that you can see:  Any grab bars or handrails.  First and last steps.  Where the edge of each step is.  Use tools that help you move around (mobility aids) if they are needed. These include:  Canes.  Walkers.  Scooters.  Crutches.  Turn on the lights when you go into a dark area. Replace any light bulbs as soon as they burn out.  Set up your furniture so you have a clear path. Avoid moving your furniture around.  If any of your floors are uneven, fix them.  If there are any pets around you, be aware of where they are.  Review your medicines with your doctor. Some medicines can make you feel dizzy. This can increase your chance of falling. Ask your doctor what other things that you can do to help prevent falls. This information is not intended to replace advice given to you by your health care provider. Make sure you discuss any questions you have with your health care provider. Document Released: 01/29/2009 Document Revised: 09/10/2015 Document Reviewed: 05/09/2014 Elsevier Interactive Patient Education  2017 [employer].

## 2019-02-01 ENCOUNTER — Encounter: Payer: Self-pay | Admitting: Pulmonary Disease

## 2019-02-01 ENCOUNTER — Other Ambulatory Visit: Payer: Self-pay

## 2019-02-01 ENCOUNTER — Ambulatory Visit (INDEPENDENT_AMBULATORY_CARE_PROVIDER_SITE_OTHER): Payer: BC Managed Care – PPO | Admitting: Pulmonary Disease

## 2019-02-01 VITALS — BP 138/84 | HR 68 | Temp 97.5°F | Ht 64.0 in | Wt 175.0 lb

## 2019-02-01 DIAGNOSIS — R05 Cough: Secondary | ICD-10-CM | POA: Diagnosis not present

## 2019-02-01 DIAGNOSIS — J3089 Other allergic rhinitis: Secondary | ICD-10-CM | POA: Diagnosis not present

## 2019-02-01 DIAGNOSIS — R059 Cough, unspecified: Secondary | ICD-10-CM

## 2019-02-01 DIAGNOSIS — J302 Other seasonal allergic rhinitis: Secondary | ICD-10-CM

## 2019-02-01 DIAGNOSIS — E669 Obesity, unspecified: Secondary | ICD-10-CM

## 2019-02-01 DIAGNOSIS — R9389 Abnormal findings on diagnostic imaging of other specified body structures: Secondary | ICD-10-CM | POA: Diagnosis not present

## 2019-02-01 NOTE — Patient Instructions (Signed)
1.  We will schedule scans of your sinuses and chest.  2.  Change Claritin to Zyrtec (cetirizine) 10 mg, it is okay to take [employer] brand.  Take the Zyrtec 1 tablet before bedtime.  3.  We will get breathing test scheduled.  4.  We will see him in follow-up in 4 to 6 weeks time, please call sooner should any new problems arise.

## 2019-02-04 ENCOUNTER — Encounter: Payer: Self-pay | Admitting: Pulmonary Disease

## 2019-02-04 NOTE — Progress Notes (Signed)
Subjective:    Patient ID: Hayley Howe, female    DOB: [birth year], 73 y.o.   MRN: [id number]  HPI The patient is a 73 year old remote former smoker (quit 1979, only 1 pack year) who presents for the issue of cough.  She is kindly referred by Dr. Steele Sizer.  Patient states that the cough started around December 2019.  She was evaluated several times and in March 2020 ACE inhibitor was discontinued.  She was switched to an ARB with hydrochlorothiazide.  She noted that this made her cough somewhat better however and never completely resolved.  She notes very little sputum production.  When she does have sputum production it is clear.  She has had no hemoptysis.  She has chronic nasal congestion symptoms and this is associated with globus sensation.  She also has unsure about potential seasonal variation.  She does not note any relationship with meals with this cough.  She has been on Singulair with very minimal relief on her nasal symptoms.  She has taken several antihistamines without relief.  She notes "wheezing" at nighttime when she is supine in bed.  During the day she does not note this "wheezing".  She has not tried any inhalers.  Nothing thing seems to improve the cough.  She has not been previously diagnosed with pulmonary disease.  She has also tried numerous nasal inhalers with little relief of her chronic nasal congestion.  Patient had a chest x-ray performed on 13 December 2018 and this showed some accentuated interstitial markings query early ILD.  I independently reviewed this film and also reviewed the film with the patient.  Past medical history, surgical history, family history have been reviewed and they are as noted.  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  As noted she is a remote former smoker but only smoked for short period of time in her 59s and discontinued this habit long ago.   Review of Systems   Constitutional: Negative.   HENT: Positive for congestion, postnasal drip, sinus pressure and trouble swallowing.   Eyes: Negative.   Respiratory: Positive for cough and wheezing.   Cardiovascular: Negative.   Gastrointestinal: Negative.   Endocrine: Negative.   Genitourinary: Negative.   Musculoskeletal: Negative.   Skin: Negative.   Allergic/Immunologic: Negative.   Neurological: Negative.   Hematological: Negative.   Psychiatric/Behavioral: Negative.   All other systems reviewed and are negative.      Objective:   Physical Exam Vitals signs and nursing note reviewed.  Constitutional:      General: She is awake.     Appearance: Normal appearance. She is overweight. She is not toxic-appearing.  HENT:     Head: Normocephalic and atraumatic.     Right Ear: External ear normal.     Left Ear: External ear normal.     Nose: Mucosal edema present.     Right Turbinates: Swollen.     Left Turbinates: Swollen.     Mouth/Throat:     Mouth: Mucous membranes are moist.  Eyes:     General: No scleral icterus.    Conjunctiva/sclera: Conjunctivae normal.     Pupils: Pupils are equal, round, and reactive to light.  Neck:     Musculoskeletal: Neck supple.     Thyroid: No thyromegaly.     Vascular: No JVD.     Trachea: Trachea and phonation normal.  Cardiovascular:  Rate and Rhythm: Normal rate and regular rhythm.     Pulses: Normal pulses.     Heart sounds: Normal heart sounds.  Pulmonary:     Effort: Pulmonary effort is normal. No respiratory distress.     Breath sounds: No wheezing or rhonchi.     Comments: Coarse breath sounds Abdominal:     General: Abdomen is protuberant. There is no distension.  Musculoskeletal: Normal range of motion.     Right lower leg: No edema.     Left lower leg: No edema.  Skin:    General: Skin is warm and dry.  Neurological:     General: No focal deficit present.     Mental Status: She is alert and oriented to person, place, and time.   Psychiatric:        Mood and Affect: Mood normal.        Behavior: Behavior normal. Behavior is cooperative.       Assessment & Plan:   1.  Cough: This patient for purposes is a non-smoker.  Cough is in non-smokers are either due to upper airway cough syndrome (postnasal drip), medication induced (i.e. ACE inhibitors), gastroesophageal reflux being the most common.  She does have evidence of coarse interstitial markings on chest x-ray and potential mild ILD may be a possibility as well as far as etiology.  In her situation it appears that her sinus issues are the driving force of her cough.  Therefore we will schedule CT of the sinuses that she has failed multiple therapies to address this issue.  She may need referral to ENT.  In addition will obtain CT chest to evaluate for potential ILD suggested by her chest x-ray.  We will also obtain pulmonary function testing to rule out potential obstructive lung disease/airways reactivity.  2.  Perennial rhinitis with seasonal variation: Recommend switching of Claritin to Zyrtec to see if this alleviates her symptoms.  3.  Abnormal chest x-ray: As noted above we will proceed with chest CT for evaluation of potential ILD.  4.  Obesity with BMI of 30: This issue adds complexity to her management, weight loss recommended.   Thank you for allowing me to participate in this patient's care.   This chart was dictated using voice recognition software/Dragon.  Despite best efforts to proofread, errors can occur which can change the meaning.  Any change was purely unintentional.

## 2019-02-22 ENCOUNTER — Ambulatory Visit
Admission: RE | Admit: 2019-02-22 | Discharge: 2019-02-22 | Disposition: A | Discharge status: Home / Self Care | Payer: BC Managed Care – PPO | Source: Ambulatory Visit | Attending: Pulmonary Disease | Admitting: Pulmonary Disease

## 2019-02-22 ENCOUNTER — Ambulatory Visit: Payer: BC Managed Care – PPO

## 2019-02-22 ENCOUNTER — Other Ambulatory Visit: Payer: Self-pay

## 2019-02-22 DIAGNOSIS — J8410 Pulmonary fibrosis, unspecified: Secondary | ICD-10-CM | POA: Diagnosis not present

## 2019-02-22 DIAGNOSIS — R05 Cough: Secondary | ICD-10-CM | POA: Diagnosis not present

## 2019-02-22 DIAGNOSIS — R0981 Nasal congestion: Secondary | ICD-10-CM | POA: Diagnosis not present

## 2019-02-22 DIAGNOSIS — R059 Cough, unspecified: Secondary | ICD-10-CM

## 2019-02-22 DIAGNOSIS — J329 Chronic sinusitis, unspecified: Secondary | ICD-10-CM | POA: Diagnosis not present

## 2019-02-22 DIAGNOSIS — J342 Deviated nasal septum: Secondary | ICD-10-CM | POA: Diagnosis not present

## 2019-02-25 ENCOUNTER — Telehealth: Payer: Self-pay | Admitting: Pulmonary Disease

## 2019-02-25 DIAGNOSIS — J328 Other chronic sinusitis: Secondary | ICD-10-CM

## 2019-02-25 NOTE — Telephone Encounter (Signed)
Notes recorded by Tyler Pita, MD on 02/22/2019 at 2:10 PM EST  CT sinuses shows sinusitis, would benefit from ENT referral. CT chest OK.  Pt is aware of results and voiced her understanding.  Order for ENT referral has been placed.  Nothing further is needed.

## 2019-02-26 ENCOUNTER — Telehealth: Payer: Self-pay | Admitting: Pulmonary Disease

## 2019-02-26 ENCOUNTER — Other Ambulatory Visit: Payer: Self-pay

## 2019-02-26 NOTE — Telephone Encounter (Signed)
Pt is aware that covid test will need to be performed priro to 11:00a to ensure results are back for PFT.  Pt voiced her understanding and had no further questions. Nothing further is needed.

## 2019-02-27 ENCOUNTER — Other Ambulatory Visit
Admission: RE | Admit: 2019-02-27 | Discharge: 2019-02-27 | Disposition: A | Discharge status: Home / Self Care | Payer: BC Managed Care – PPO | Source: Ambulatory Visit | Attending: Pulmonary Disease | Admitting: Pulmonary Disease

## 2019-02-27 ENCOUNTER — Other Ambulatory Visit: Payer: Self-pay

## 2019-02-27 DIAGNOSIS — Z01812 Encounter for preprocedural laboratory examination: Secondary | ICD-10-CM | POA: Insufficient documentation

## 2019-02-27 DIAGNOSIS — Z20828 Contact with and (suspected) exposure to other viral communicable diseases: Secondary | ICD-10-CM | POA: Diagnosis not present

## 2019-02-27 LAB — SARS CORONAVIRUS 2 (TAT 6-24 HRS): SARS Coronavirus 2: NEGATIVE

## 2019-02-28 ENCOUNTER — Other Ambulatory Visit: Payer: Self-pay

## 2019-02-28 ENCOUNTER — Ambulatory Visit: Payer: BC Managed Care – PPO | Attending: Pulmonary Disease

## 2019-02-28 DIAGNOSIS — R05 Cough: Secondary | ICD-10-CM | POA: Insufficient documentation

## 2019-02-28 DIAGNOSIS — R059 Cough, unspecified: Secondary | ICD-10-CM

## 2019-02-28 MED ORDER — ALBUTEROL SULFATE (2.5 MG/3ML) 0.083% IN NEBU
2.5000 mg | INHALATION_SOLUTION | Freq: Once | RESPIRATORY_TRACT | Status: AC
  Administered 2019-02-28: 10:00:00 2.5 mg via RESPIRATORY_TRACT
  Filled 2019-02-28: qty 3

## 2019-03-07 ENCOUNTER — Encounter: Payer: Self-pay | Admitting: Pulmonary Disease

## 2019-03-07 ENCOUNTER — Ambulatory Visit: Payer: Medicare HMO | Admitting: Pulmonary Disease

## 2019-03-07 ENCOUNTER — Other Ambulatory Visit: Payer: Self-pay

## 2019-03-07 VITALS — BP 136/70 | HR 67 | Temp 97.4°F | Ht 64.0 in | Wt 174.8 lb

## 2019-03-07 DIAGNOSIS — R059 Cough, unspecified: Secondary | ICD-10-CM

## 2019-03-07 DIAGNOSIS — E669 Obesity, unspecified: Secondary | ICD-10-CM

## 2019-03-07 DIAGNOSIS — J302 Other seasonal allergic rhinitis: Secondary | ICD-10-CM

## 2019-03-07 DIAGNOSIS — J328 Other chronic sinusitis: Secondary | ICD-10-CM

## 2019-03-07 DIAGNOSIS — R05 Cough: Secondary | ICD-10-CM

## 2019-03-07 DIAGNOSIS — J3089 Other allergic rhinitis: Secondary | ICD-10-CM

## 2019-03-07 DIAGNOSIS — R9389 Abnormal findings on diagnostic imaging of other specified body structures: Secondary | ICD-10-CM

## 2019-03-07 NOTE — Progress Notes (Signed)
Subjective:    Patient ID: Hayley Howe, female    DOB: [birth year], 73 y.o.   MRN: [id number]  HPI Patient is a 73 year old remote former smoker who presents for follow-up on the issue of cough. 73 year old  She was initially evaluated on 01 February 2019 please refer to that note for details.  It was noted on the first visit that she had abnormal chest x-ray with increased bronchial markings and she also had issues that appear to be related to chronic sinusitis.  A CT chest was performed that showed just old healed granulomatous disease with no evidence of interstitial lung disease.  This was performed on 22 February 2019.  CT of the sinuses also performed during that day showed quite significant sinus disease with frontal and ethmoid sinus involvement mucosal thickening lining the remaining sinus cavities.  She also had dependent secretions in the right maxillary and right sphenoid sinuses.  She had occlusion of the ostiomeatal complexes and frontoethmoidal and sphenoethmoidal recesses.  Patient had pulmonary function testing on 28 February 2019, this was essentially a normal study.  I discussed all these findings with the patient today.  She continues to have the same issues with dry cough and "wheezing" which she notes is usually on her neck area she also notes some shortness of breath with exertion.  Symptoms are similar to her initial presentation.  Continues to note significant postnasal drip.  Patient has had no fevers, chills or sweats.  Has no new complaints.   Review of Systems A 10 point review of systems was performed and it is as noted above otherwise negative.    Objective:  BP 136/70 (BP Location: Left Arm, Cuff Size: Normal)   Pulse 67   Temp (!) 97.4 F (36.3 C) (Temporal)   Ht 5\' 4"  (1.626 m)   Wt 174 lb 12.8 oz (79.3 kg)   SpO2 98%   BMI 30.00 kg/m  GENERAL: Awake, alert, no respiratory distress.  Fully ambulatory HEAD: Normocephalic, atraumatic.  EYES: Pupils equal, round,  reactive to light.  No scleral icterus.  MOUTH: Nose/mouth/throat not examined due to masking requirements for COVID 19. NECK: Supple. No thyromegaly.  Trachea midline. No JVD.  PULMONARY: Lungs clear to auscultation bilaterally.  No wheezes or rhonchi CARDIOVASCULAR: S1 and S2. Regular rate and rhythm.  GASTROINTESTINAL: No abdominal distention noted MUSCULOSKELETAL: No joint swelling, no clubbing, nor edema.  NEUROLOGIC: No focal deficit, awake and alert, fluent speech SKIN: Intact,warm,dry, no petechiae or rashes. PSYCH: Mood and behavior normal      Assessment & Plan:  Chronic sinusitis with chronic postnasal drip syndrome Patient CT scan of the sinuses reveals that she has extensive disease We will refer her to ENT she has been referred to Dr. Tami Ribas Defer recommendations to Dr. Tami Ribas  Cough This is likely related to upper airway cough syndrome Management will consist on treatment of sinus disease No evidence of obstructive or restrictive lung disease on PFTs  Perennial rhinitis with seasonal variation Somewhat improved in this regard however still having difficulties She has been referred to ENT as above  Abnormal chest x-ray CT scan shows no evidence of ILD Old granulomatous disease   Follow-up will be in 2 months time she is to contact us prior to that time should any new difficulties arise.  Renold Don, MD [facility]   *This note was dictated using voice recognition software/Dragon.  Despite best efforts to proofread, errors can occur which can change the meaning.  Any change  was purely unintentional.

## 2019-03-07 NOTE — Patient Instructions (Signed)
1.  Will refer you to ear nose and throat.  You have pretty bad sinusitis that has been shown on the CT scan.  2.  We will see him in follow-up in 2 months time.

## 2019-03-20 DIAGNOSIS — J329 Chronic sinusitis, unspecified: Secondary | ICD-10-CM | POA: Diagnosis not present

## 2019-03-21 ENCOUNTER — Ambulatory Visit (INDEPENDENT_AMBULATORY_CARE_PROVIDER_SITE_OTHER): Payer: BC Managed Care – PPO | Admitting: Family Medicine

## 2019-03-21 ENCOUNTER — Other Ambulatory Visit: Payer: Self-pay | Admitting: Unknown Physician Specialty

## 2019-03-21 ENCOUNTER — Encounter: Payer: Self-pay | Admitting: Family Medicine

## 2019-03-21 ENCOUNTER — Ambulatory Visit: Payer: BC Managed Care – PPO | Admitting: Family Medicine

## 2019-03-21 ENCOUNTER — Other Ambulatory Visit: Payer: Self-pay

## 2019-03-21 VITALS — BP 138/75

## 2019-03-21 DIAGNOSIS — J321 Chronic frontal sinusitis: Secondary | ICD-10-CM | POA: Diagnosis not present

## 2019-03-21 DIAGNOSIS — G4726 Circadian rhythm sleep disorder, shift work type: Secondary | ICD-10-CM | POA: Diagnosis not present

## 2019-03-21 DIAGNOSIS — E7800 Pure hypercholesterolemia, unspecified: Secondary | ICD-10-CM

## 2019-03-21 DIAGNOSIS — R05 Cough: Secondary | ICD-10-CM | POA: Diagnosis not present

## 2019-03-21 DIAGNOSIS — M170 Bilateral primary osteoarthritis of knee: Secondary | ICD-10-CM | POA: Diagnosis not present

## 2019-03-21 DIAGNOSIS — G8929 Other chronic pain: Secondary | ICD-10-CM

## 2019-03-21 DIAGNOSIS — J329 Chronic sinusitis, unspecified: Secondary | ICD-10-CM | POA: Diagnosis not present

## 2019-03-21 DIAGNOSIS — I10 Essential (primary) hypertension: Secondary | ICD-10-CM

## 2019-03-21 DIAGNOSIS — J301 Allergic rhinitis due to pollen: Secondary | ICD-10-CM | POA: Diagnosis not present

## 2019-03-21 DIAGNOSIS — R053 Chronic cough: Secondary | ICD-10-CM

## 2019-03-21 DIAGNOSIS — J309 Allergic rhinitis, unspecified: Secondary | ICD-10-CM | POA: Diagnosis not present

## 2019-03-21 DIAGNOSIS — M5441 Lumbago with sciatica, right side: Secondary | ICD-10-CM

## 2019-03-21 DIAGNOSIS — F5104 Psychophysiologic insomnia: Secondary | ICD-10-CM | POA: Diagnosis not present

## 2019-03-21 MED ORDER — TRAMADOL HCL 50 MG PO TABS: 50.0000 mg | ORAL_TABLET | Freq: Two times a day (BID) | ORAL | 2 refills | Status: DC | PRN

## 2019-03-21 MED ORDER — ZOLPIDEM TARTRATE ER 12.5 MG PO TBCR: 12.5000 mg | EXTENDED_RELEASE_TABLET | Freq: Every day | ORAL | 0 refills | Status: DC

## 2019-03-21 NOTE — Progress Notes (Signed)
Name: Hayley Howe   MRN: [id number]    DOB: [birth year]   Date:03/21/2019       Progress Note  Subjective  Chief Complaint  Chief Complaint  Patient presents with  . Hypertension  . Hyperlipidemia  . Hyperglycemia  . Cough    Continues to have cough. She is scheduled to be evaluated by ENT today.    I connected with  Hayley Howe on 03/21/19 at  8:40 AM EST by telephone and verified that I am speaking with the correct person using two identifiers.  I discussed the limitations, risks, security and privacy concerns of performing an evaluation and management service by telephone and the availability of in person appointments. Staff also discussed with the patient that there may be a patient responsible charge related to this service. Patient Location: home  Provider Location: [facility]   HPI  Chronic sinusitis: she was seen by Dr. Duwayne Heck, had a normal spirometry, CT showed some scarring, CT sinus showed chronic sinusitis and she will see ENT this afternoon. She states cough is constant, seems to be worse in the mornings. She states symptoms have been present since the beginning of the year but worse over the past few months.   Wheezing: at night, normal spirometry, she is on zyrtec but not helping. Dr. Duwayne Heck recommended evaluation by ENT  Chronic Low Back: she has a long history of low back pain, not secondary to trauma, DDD lumbar spineand also scoliosis.taking Tramadol daily to control symptoms(takes 1 daily, takes 2nd tablets a few times a week), she is also on SkelaxinprnUnable to tolerate Flexeril - it makes her sleepy. Pain is constant but worse after work.Occasionally the pain radiates to bilateralthighs,and when radiating it is described as numbnessthat is seldom maybe a couple of times a month. Pain at this time is 3/10 but worse after work . She goes to chiropractor( Dr. Genoveva Ill a month andis stable.  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 She sleepsfrom around2pm-8:30pmfor about 5-7hours when she sleeps during the day and about 5 hours during the night.Taking Ambien, denies side effects of medication, she is aware of FDA guidelines however unable to sleep with lower dose of Ambien.She wakes up at times because of the cough.   OA: she has aching pain on left knee, s/pleftknee replaced, only triggered by kneeling down, surgery was done by Dr. Rudene Christians in 2012, no recent visits with him,she states she is doing well at this time. She has not been taking otc medication   Hyperglycemia: last hgbA1C was 6%, denies polyphagia, polydipsia or polyuria. Trying to eat healthy. We will continue to monitor   HTN: taking valsartan hctz, since we stopped lisinopril and is doing well, no side effects, no chest pain or palpitation. BP is at goal at home    Patient Active Problem List   Diagnosis Date Noted  . Hyperglycemia 07/13/2017  . Chronic constipation 12/15/2016  . Primary osteoarthritis of left hip 11/05/2015  . Scoliosis 11/05/2015  . Chronic low back pain 10/30/2014  . Benign essential HTN 10/27/2014  . Bradycardia 10/27/2014  . Insomnia 10/27/2014  . Headache, temporal 10/27/2014  . Hypercholesteremia 10/27/2014  . Obesity (BMI 30-39.9) 10/27/2014  . Changing sleep-work schedule, affecting sleep 10/27/2014  . Vitamin D deficiency 10/27/2014  . Arthritis of knee, degenerative 09/24/2009    Past Surgical History:  Procedure Laterality Date  . ABDOMINAL HYSTERECTOMY    . BREAST BIOPSY Right 01/21/2013  stereo - benign  . COLONOSCOPY WITH PROPOFOL N/A 06/30/2016   Procedure: COLONOSCOPY WITH PROPOFOL;  Surgeon: Jonathon Bellows, MD;  Location: [facility] ENDOSCOPY;  Service: Endoscopy;  Laterality: N/A;  . KNEE SURGERY Left 2012   Dr. Rudene Christians  . OOPHORECTOMY    . TUBAL LIGATION    . VAGINAL PROLAPSE REPAIR      Family History  Problem Relation Age of Onset  . Breast  cancer Sister 30       in year 2013  . Cancer Sister   . Hyperlipidemia Mother   . Diabetes Mother   . Cancer Father        Leukemia  . Hyperlipidemia Father   . Cancer Brother        Lung  . Cancer Sister     Social History   Socioeconomic History  . Marital status: Widowed    Spouse name: Not on file  . Number of children: 2  . Years of education: Not on file  . Highest education level: 12th grade  Occupational History  . Occupation: Education officer, museum: [employer]  . Financial resource strain: Not hard at all  . Food insecurity    Worry: Never true    Inability: Never true  . Transportation needs    Medical: No    Non-medical: No  Tobacco Use  . Smoking status: Former Smoker    Packs/day: 0.25    Years: 1.00    Pack years: 0.25    Types: Cigarettes    Start date: 08/13/1967    Quit date: 04/18/1977    Years since quitting: 41.9  . Smokeless tobacco: Never Used  . Tobacco comment: smoking cessation materials not required  Substance and Sexual Activity  . Alcohol use: No    Alcohol/week: 0.0 standard drinks  . Drug use: No  . Sexual activity: Not Currently  Lifestyle  . Physical activity    Days per week: 0 days    Minutes per session: 0 min  . Stress: Not at all  Relationships  . Social connections    Talks on phone: More than three times a week    Gets together: Twice a week    Attends religious service: More than 4 times per year    Active member of club or organization: Yes    Attends meetings of clubs or organizations: More than 4 times per year    Relationship status: Widowed  . Intimate partner violence    Fear of current or ex partner: No    Emotionally abused: No    Physically abused: No    Forced sexual activity: No  Other Topics Concern  . Not on file  Social History Narrative   Daughter lives with her and grandson   Still working full time at [employer] 3rd shift      Mother of Joanne Ouk     Current Outpatient  Medications:  .  amLODipine (NORVASC) 10 MG tablet, Take 1 tablet (10 mg total) by mouth daily., Disp: 90 tablet, Rfl: 1 .  aspirin EC 81 MG tablet, Take 1 tablet (81 mg total) by mouth daily., Disp: 30 tablet, Rfl: 0 .  Cholecalciferol (VITAMIN D) 2000 UNITS tablet, Take 1 tablet by mouth daily., Disp: , Rfl:  .  fluticasone (FLONASE) 50 MCG/ACT nasal spray, Use 2 spray(s) in each nostril once daily, Disp: 16 g, Rfl: 0 .  loratadine (CLARITIN) 10 MG tablet, Take 1 tablet (10 mg total) by  mouth daily., Disp: 30 tablet, Rfl: 0 .  Magnesium 250 MG TABS, Take 1 tablet by mouth daily., Disp: , Rfl:  .  montelukast (SINGULAIR) 10 MG tablet, Take 1 tablet (10 mg total) by mouth at bedtime., Disp: 90 tablet, Rfl: 0 .  pravastatin (PRAVACHOL) 40 MG tablet, Take 1 tablet (40 mg total) by mouth daily., Disp: 90 tablet, Rfl: 1 .  traMADol (ULTRAM) 50 MG tablet, Take 1 tablet (50 mg total) by mouth 2 (two) times daily as needed. Fill 01/01/2019 and monthly thereafter, Disp: 60 tablet, Rfl: 2 .  valsartan-hydrochlorothiazide (DIOVAN-HCT) 160-12.5 MG tablet, Take 1 tablet by mouth daily., Disp: 90 tablet, Rfl: 1 .  vitamin C (ASCORBIC ACID) 500 MG tablet, Take 1 tablet by mouth daily., Disp: , Rfl:  .  zolpidem (AMBIEN CR) 12.5 MG CR tablet, Take 1 tablet (12.5 mg total) by mouth at bedtime., Disp: 90 tablet, Rfl: 0  No Known Allergies  I personally reviewed active problem list, medication list, allergies, family history, social history, health maintenance with the patient/caregiver today.   ROS  Ten systems reviewed and is negative except as mentioned in HPI  Objective  Virtual encounter, vitals  Obtained at home  Vitals:   03/21/19 0918  BP: 138/75    There is no height or weight on file to calculate BMI.  Physical Exam  Awake, alert and oriented   PHQ2/9: Depression screen [facility] 2/9 03/21/2019 01/24/2019 12/13/2018 07/26/2018 06/22/2018  Decreased Interest 0 0 0 0 0  Down, Depressed, Hopeless 0  0 0 0 0  PHQ - 2 Score 0 0 0 0 0  Altered sleeping 0 - 0 0 0  Tired, decreased energy 0 - 0 0 0  Change in appetite 0 - 0 0 0  Feeling bad or failure about yourself  0 - 0 0 0  Trouble concentrating 0 - 0 0 0  Moving slowly or fidgety/restless 0 - 0 0 0  Suicidal thoughts 0 - 0 0 0  PHQ-9 Score 0 - 0 0 0  Difficult doing work/chores - - Not difficult at all - -   PHQ-2/9 Result is negative.    Fall Risk: Fall Risk  03/21/2019 01/24/2019 12/13/2018 07/26/2018 06/22/2018  Falls in the past year? 0 0 0 0 0  Number falls in past yr: 0 0 0 0 0  Injury with Fall? 0 0 0 0 0  Risk for fall due to : - - - - -  Risk for fall due to: Comment - - - - -  Follow up - Falls prevention discussed Falls evaluation completed - -     Assessment & Plan  1. Chronic insomnia  - zolpidem (AMBIEN CR) 12.5 MG CR tablet; Take 1 tablet (12.5 mg total) by mouth at bedtime.  Dispense: 90 tablet; Refill: 0  2. Changing sleep-work schedule, affecting sleep  - zolpidem (AMBIEN CR) 12.5 MG CR tablet; Take 1 tablet (12.5 mg total) by mouth at bedtime.  Dispense: 90 tablet; Refill: 0  3. Primary osteoarthritis of both knees  - traMADol (ULTRAM) 50 MG tablet; Take 1 tablet (50 mg total) by mouth 2 (two) times daily as needed. Fill 01/01/2019 and monthly thereafter  Dispense: 60 tablet; Refill: 2  4. Chronic right-sided low back pain with right-sided sciatica  - traMADol (ULTRAM) 50 MG tablet; Take 1 tablet (50 mg total) by mouth 2 (two) times daily as needed. Fill 01/01/2019 and monthly thereafter  Dispense: 60 tablet; Refill: 2  5. Chronic cough  Seen by pulmonologist and has follow up with ENT today   6. Benign essential HTN  Doing well on current regiment   7. Hypercholesteremia  Recheck next year, consider changing therapy   8. Chronic frontal sinusitis  Going to see ENT today  I discussed the assessment and treatment plan with the patient. The patient was provided an opportunity to ask  questions and all were answered. The patient agreed with the plan and demonstrated an understanding of the instructions.   The patient was advised to call back or seek an in-person evaluation if the symptoms worsen or if the condition fails to improve as anticipated.  I provided 25  minutes of non-face-to-face time during this encounter.  Loistine Chance, MD

## 2019-04-08 ENCOUNTER — Other Ambulatory Visit: Payer: Self-pay

## 2019-04-08 ENCOUNTER — Ambulatory Visit
Admission: RE | Admit: 2019-04-08 | Discharge: 2019-04-08 | Disposition: A | Discharge status: Home / Self Care | Payer: BC Managed Care – PPO | Source: Ambulatory Visit | Attending: Unknown Physician Specialty | Admitting: Unknown Physician Specialty

## 2019-04-08 DIAGNOSIS — J329 Chronic sinusitis, unspecified: Secondary | ICD-10-CM | POA: Insufficient documentation

## 2019-04-09 DIAGNOSIS — J329 Chronic sinusitis, unspecified: Secondary | ICD-10-CM | POA: Diagnosis not present

## 2019-05-02 DIAGNOSIS — J329 Chronic sinusitis, unspecified: Secondary | ICD-10-CM | POA: Diagnosis not present

## 2019-05-02 DIAGNOSIS — R05 Cough: Secondary | ICD-10-CM | POA: Diagnosis not present

## 2019-05-16 ENCOUNTER — Encounter: Payer: Self-pay | Admitting: Pulmonary Disease

## 2019-05-16 ENCOUNTER — Ambulatory Visit (INDEPENDENT_AMBULATORY_CARE_PROVIDER_SITE_OTHER): Payer: Medicare HMO | Admitting: Pulmonary Disease

## 2019-05-16 DIAGNOSIS — J302 Other seasonal allergic rhinitis: Secondary | ICD-10-CM | POA: Diagnosis not present

## 2019-05-16 DIAGNOSIS — R059 Cough, unspecified: Secondary | ICD-10-CM

## 2019-05-16 DIAGNOSIS — J328 Other chronic sinusitis: Secondary | ICD-10-CM | POA: Diagnosis not present

## 2019-05-16 DIAGNOSIS — J3089 Other allergic rhinitis: Secondary | ICD-10-CM | POA: Diagnosis not present

## 2019-05-16 DIAGNOSIS — R05 Cough: Secondary | ICD-10-CM | POA: Diagnosis not present

## 2019-05-16 NOTE — Patient Instructions (Signed)
We will see him in follow-up in 6 months time.  Call sooner if you have any new difficulties or recurrence of cough.

## 2019-05-16 NOTE — Progress Notes (Signed)
   Subjective:    Patient ID: Hayley Howe, female    DOB: Dec 30, 1945, 74 y.o.   MRN: [id number]  Virtual Visit Via Video or Telephone Note:   This visit type was conducted due to national recommendations for restrictions regarding the COVID-19 pandemic .  This format is felt to be most appropriate for this patient at this time.  All issues noted in this document were discussed and addressed.  No physical exam was performed (except for noted visual exam findings with Video Visits).    I connected with Hayley Howe by a video enabled telemedicine application or telephone at 9:07 AM and verified that I was speaking with the correct person using two identifiers. Location patient: home Location provider: [facility] Persons participating in the virtual visit: patient, physician   I discussed the limitations, risks, security and privacy concerns of performing an evaluation and management service by telephone and the availability of in person appointments. The patient expressed understanding and agreed to proceed.  HPI Patient is a 74 year old very remote former smoker whom we are connecting with via telephone for follow-up on the issue of cough.  Last visit here was on 07 March 2019 please refer to that note for details.  Patient was referred to Dr. Tami Ribas for issues with chronic sinusitis, perennial rhinitis and postnasal drip.  Her cough is likely related to these issues.  She states that since she was treated with antibiotics and prednisone for an extended period of time she is now feeling markedly better.  She has not had any cough of note.  No fevers, chills or sweats.  She has had no issues with dyspnea or wheezing.  She voices no other complaint.  Overall she is doing well.   Review of Systems A 10 point review of systems was performed and it is as noted above otherwise negative.    Objective:   Physical Exam  Physical exam not performed as this is a via  telephone visit.  Patient is fluent in speech and does not sound dyspneic.  She still has a significant nasal quality to her speech.      Assessment & Plan:  Chronic pansinusitis Being managed by ENT Follow-up with ENT as needed  Cough Upper airway cough syndrome Resolved with management of chronic sinusitis  Perennial allergic rhinitis with seasonal variation Continue management as per ENT   We will see her in follow-up in 6 months time she is to contact us prior to that time should any new difficulties arise.  You also understands that for sinus disease flareups she should follow-up with ENT.  Total visit time 11 minutes.  Renold Don, MD [facility]   *This note was dictated using voice recognition software/Dragon.  Despite best efforts to proofread, errors can occur which can change the meaning.  Any change was purely unintentional.

## 2019-05-26 ENCOUNTER — Other Ambulatory Visit: Payer: Self-pay

## 2019-05-26 ENCOUNTER — Ambulatory Visit: Payer: Medicare HMO | Attending: Internal Medicine

## 2019-05-26 DIAGNOSIS — Z23 Encounter for immunization: Secondary | ICD-10-CM

## 2019-05-26 NOTE — Progress Notes (Signed)
   Covid-19 Vaccination Clinic  Name:  Hayley Howe    MRN: [id number] DOB: [birth year]  05/26/2019  [name] was observed post Covid-19 immunization for 15 minutes without incidence. She was provided with Vaccine Information Sheet and instruction to access the V-Safe system.   [name] was instructed to call 911 with any severe reactions post vaccine: Marland Kitchen Difficulty breathing  . Swelling of your face and throat  . A fast heartbeat  . A bad rash all over your body  . Dizziness and weakness    Immunizations Administered    Name Date Dose VIS Date Route   Moderna COVID-19 Vaccine 05/26/2019  2:51 PM 0.5 mL 03/19/2019 Intramuscular   Manufacturer: Moderna   Lot: [id number]   [id number]

## 2019-06-20 ENCOUNTER — Ambulatory Visit: Payer: BC Managed Care – PPO | Admitting: Family Medicine

## 2019-06-21 ENCOUNTER — Telehealth: Payer: Self-pay

## 2019-06-21 NOTE — Telephone Encounter (Signed)
Copied from [place] [id number]. Topic: General - Other >> Jun 21, 2019  1:05 PM Rainey Pines A wrote: Patient would like to know if Dr. Ancil Boozer accepts [facility] obsolete PTO. Please advise

## 2019-06-21 NOTE — Telephone Encounter (Signed)
Spoke with patient and informed her that we were not participating with [facility] Absolute PPO at this time.

## 2019-06-26 ENCOUNTER — Ambulatory Visit: Payer: Medicare HMO | Attending: Internal Medicine

## 2019-06-26 DIAGNOSIS — Z23 Encounter for immunization: Secondary | ICD-10-CM

## 2019-06-26 NOTE — Progress Notes (Signed)
   Covid-19 Vaccination Clinic  Name:  Hayley Howe    MRN: [id number] DOB: 10-24-1945  06/26/2019  [name] was observed post Covid-19 immunization for 15 minutes without incident. She was provided with Vaccine Information Sheet and instruction to access the V-Safe system.   [name] was instructed to call 911 with any severe reactions post vaccine: Marland Kitchen Difficulty breathing  . Swelling of face and throat  . A fast heartbeat  . A bad rash all over body  . Dizziness and weakness   Immunizations Administered    Name Date Dose VIS Date Route   Moderna COVID-19 Vaccine 06/26/2019  1:58 PM 0.5 mL 03/19/2019 Intramuscular   Manufacturer: Moderna   Lot: [id number]   [id number]

## 2019-07-28 ENCOUNTER — Other Ambulatory Visit: Payer: Self-pay | Admitting: Family Medicine

## 2019-07-28 DIAGNOSIS — G8929 Other chronic pain: Secondary | ICD-10-CM

## 2019-07-28 NOTE — Telephone Encounter (Signed)
Requested medication (s) are due for refill today: yes  Requested medication (s) are on the active medication list: no  Last refill:  12/15/16  Future visit scheduled: no  Notes to clinic:  medication not delegated to NT to refill; prescription expired 01/24/19   Requested Prescriptions  Pending Prescriptions Disp Refills   metaxalone (SKELAXIN) 800 MG tablet [Pharmacy Med Name: Metaxalone 800 MG Oral Tablet] 90 tablet 0    Sig: Take 1 tablet by mouth three times daily as needed for muscle spasm      Not Delegated - Analgesics:  Muscle Relaxants Failed - 07/28/2019  9:27 AM      Failed - This refill cannot be delegated      Failed - Valid encounter within last 6 months    Recent Outpatient Visits           4 months ago Chronic cough   [facility] Steele Sizer, MD   7 months ago Chronic cough   [facility] Steele Sizer, MD   1 year ago Benign essential HTN   [facility] Steele Sizer, MD   1 year ago [facility] Steele Sizer, MD   1 year ago Cough   [place], Bethel Born, NP       Future Appointments             In 6 months [facility], [facility]

## 2019-08-04 ENCOUNTER — Other Ambulatory Visit: Payer: Self-pay | Admitting: Family Medicine

## 2019-08-04 DIAGNOSIS — M5441 Lumbago with sciatica, right side: Secondary | ICD-10-CM

## 2019-08-04 DIAGNOSIS — G8929 Other chronic pain: Secondary | ICD-10-CM

## 2019-08-04 NOTE — Telephone Encounter (Signed)
Requested medication (s) are due for refill today: yes  Requested medication (s) are on the active medication list: no  Last refill:  12/15/2016  Future visit scheduled: no  Notes to clinic:  prescription expired 01/24/19 Attempted to call pt but was not home.  Requested Prescriptions  Pending Prescriptions Disp Refills   metaxalone (SKELAXIN) 800 MG tablet [Pharmacy Med Name: Metaxalone 800 MG Oral Tablet] 90 tablet 0    Sig: Take 1 tablet by mouth three times daily as needed for muscle spasm      Not Delegated - Analgesics:  Muscle Relaxants Failed - 08/04/2019 12:49 PM      Failed - This refill cannot be delegated      Failed - Valid encounter within last 6 months    Recent Outpatient Visits           4 months ago Chronic cough   [facility] Steele Sizer, MD   7 months ago Chronic cough   [facility] Steele Sizer, MD   1 year ago Benign essential HTN   [facility] Steele Sizer, MD   1 year ago [facility] Steele Sizer, MD   1 year ago Cough   [place], Bethel Born, NP       Future Appointments             In 5 months [facility], [facility]

## 2019-08-08 NOTE — Telephone Encounter (Signed)
Patient returned call to inform she does want the refill on her muscle relaxer and the pravastatin.

## 2019-08-09 NOTE — Telephone Encounter (Signed)
Lvm to sch appt °

## 2019-08-13 ENCOUNTER — Other Ambulatory Visit: Payer: Self-pay | Admitting: Family Medicine

## 2019-08-13 DIAGNOSIS — E7800 Pure hypercholesterolemia, unspecified: Secondary | ICD-10-CM

## 2019-08-14 NOTE — Telephone Encounter (Signed)
lvm to schedule follow up appt

## 2019-08-18 ENCOUNTER — Other Ambulatory Visit: Payer: Self-pay | Admitting: Family Medicine

## 2019-08-18 DIAGNOSIS — G4726 Circadian rhythm sleep disorder, shift work type: Secondary | ICD-10-CM

## 2019-08-18 DIAGNOSIS — F5104 Psychophysiologic insomnia: Secondary | ICD-10-CM

## 2019-08-18 NOTE — Telephone Encounter (Signed)
Requested medication (s) are due for refill today: yes  Requested medication (s) are on the active medication list: yes  Last refill:  03/21/19  Future visit scheduled: yes  Notes to clinic:  med not delegated to NT to RF   Requested Prescriptions  Pending Prescriptions Disp Refills   zolpidem (AMBIEN CR) 12.5 MG CR tablet [Pharmacy Med Name: Zolpidem Tartrate ER 12.5 MG Oral Tablet Extended Release] 90 tablet 0    Sig: TAKE 1 TABLET BY MOUTH AT BEDTIME      Not Delegated - Psychiatry:  Anxiolytics/Hypnotics Failed - 08/18/2019  1:06 PM      Failed - This refill cannot be delegated      Failed - Urine Drug Screen completed in last 360 days.      Failed - Valid encounter within last 6 months    Recent Outpatient Visits           5 months ago Chronic cough   [facility] Steele Sizer, MD   8 months ago Chronic cough   [facility] Steele Sizer, MD   1 year ago Benign essential HTN   [facility] Steele Sizer, MD   1 year ago [facility] Steele Sizer, MD   1 year ago Cough   [place], NP       Future Appointments             In 2 weeks Steele Sizer, MD [facility], [place]   In 5 months  [facility], [facility]

## 2019-09-05 ENCOUNTER — Encounter: Payer: Self-pay | Admitting: Family Medicine

## 2019-09-05 ENCOUNTER — Other Ambulatory Visit: Payer: Self-pay

## 2019-09-05 ENCOUNTER — Ambulatory Visit (INDEPENDENT_AMBULATORY_CARE_PROVIDER_SITE_OTHER): Payer: BC Managed Care – PPO | Admitting: Family Medicine

## 2019-09-05 VITALS — BP 136/80 | HR 94 | Temp 97.1°F | Resp 16 | Ht 64.0 in | Wt 166.8 lb

## 2019-09-05 DIAGNOSIS — R05 Cough: Secondary | ICD-10-CM | POA: Diagnosis not present

## 2019-09-05 DIAGNOSIS — M170 Bilateral primary osteoarthritis of knee: Secondary | ICD-10-CM

## 2019-09-05 DIAGNOSIS — R062 Wheezing: Secondary | ICD-10-CM

## 2019-09-05 DIAGNOSIS — E7800 Pure hypercholesterolemia, unspecified: Secondary | ICD-10-CM

## 2019-09-05 DIAGNOSIS — J302 Other seasonal allergic rhinitis: Secondary | ICD-10-CM

## 2019-09-05 DIAGNOSIS — R739 Hyperglycemia, unspecified: Secondary | ICD-10-CM | POA: Diagnosis not present

## 2019-09-05 DIAGNOSIS — F5104 Psychophysiologic insomnia: Secondary | ICD-10-CM | POA: Diagnosis not present

## 2019-09-05 DIAGNOSIS — G4726 Circadian rhythm sleep disorder, shift work type: Secondary | ICD-10-CM | POA: Diagnosis not present

## 2019-09-05 DIAGNOSIS — D692 Other nonthrombocytopenic purpura: Secondary | ICD-10-CM | POA: Diagnosis not present

## 2019-09-05 DIAGNOSIS — R350 Frequency of micturition: Secondary | ICD-10-CM

## 2019-09-05 DIAGNOSIS — G8929 Other chronic pain: Secondary | ICD-10-CM

## 2019-09-05 DIAGNOSIS — I10 Essential (primary) hypertension: Secondary | ICD-10-CM | POA: Diagnosis not present

## 2019-09-05 DIAGNOSIS — R053 Chronic cough: Secondary | ICD-10-CM

## 2019-09-05 DIAGNOSIS — L729 Follicular cyst of the skin and subcutaneous tissue, unspecified: Secondary | ICD-10-CM

## 2019-09-05 DIAGNOSIS — M5441 Lumbago with sciatica, right side: Secondary | ICD-10-CM

## 2019-09-05 LAB — POCT URINALYSIS DIPSTICK
Bilirubin, UA: NEGATIVE
Glucose, UA: NEGATIVE
Ketones, UA: NEGATIVE
Nitrite, UA: NEGATIVE
Protein, UA: POSITIVE — AB
Spec Grav, UA: 1.015 (ref 1.010–1.025)
Urobilinogen, UA: 0.2 E.U./dL
pH, UA: 5 (ref 5.0–8.0)

## 2019-09-05 MED ORDER — METAXALONE 800 MG PO TABS: 800.0000 mg | ORAL_TABLET | Freq: Three times a day (TID) | ORAL | 0 refills | Status: DC | PRN

## 2019-09-05 MED ORDER — VALSARTAN-HYDROCHLOROTHIAZIDE 160-12.5 MG PO TABS: 1.0000 | ORAL_TABLET | Freq: Every day | ORAL | 1 refills | Status: DC

## 2019-09-05 MED ORDER — PRAVASTATIN SODIUM 40 MG PO TABS: 40.0000 mg | ORAL_TABLET | Freq: Every day | ORAL | 1 refills | Status: DC

## 2019-09-05 MED ORDER — ZOLPIDEM TARTRATE ER 12.5 MG PO TBCR: 12.5000 mg | EXTENDED_RELEASE_TABLET | Freq: Every day | ORAL | 0 refills | Status: DC

## 2019-09-05 MED ORDER — MONTELUKAST SODIUM 10 MG PO TABS: 10.0000 mg | ORAL_TABLET | Freq: Every day | ORAL | 1 refills | Status: DC

## 2019-09-05 MED ORDER — TRAMADOL HCL 50 MG PO TABS: 50.0000 mg | ORAL_TABLET | Freq: Two times a day (BID) | ORAL | 2 refills | Status: DC | PRN

## 2019-09-05 MED ORDER — AMLODIPINE BESYLATE 10 MG PO TABS: 10.0000 mg | ORAL_TABLET | Freq: Every day | ORAL | 1 refills | Status: DC

## 2019-09-05 MED ORDER — CIPROFLOXACIN HCL 250 MG PO TABS: 250.0000 mg | ORAL_TABLET | Freq: Two times a day (BID) | ORAL | 0 refills | Status: DC

## 2019-09-05 NOTE — Progress Notes (Signed)
Name: Hayley Howe   MRN: [id number]    DOB: April 15, 1946   Date:09/05/2019       Progress Note  Subjective  Chief Complaint  Chief Complaint  Patient presents with  . Hypertension    follow up medication refills  . Hyperlipidemia  . Insomnia  . Allergic Rhinitis   . Urinary Frequency    HPI  Urinary frequency: she has noticed increase in urinary frequency over the past month, she states more urgency than usual , nocturia is stable, no dysuria, no hematuria , denies abdominal pain or flank pain, no fever or chills. Discussed possible incontinence versus infection. Discussed waiting for culture result and if negative start Detrol for urge incontinence, or to treat empirically with antibiotics prior to culture results and she chose the later.   Chronic sinusitis: she was seen by Dr. Duwayne Heck - pulmonologist , had a normal spirometry, CT showed some scarring, CT sinus showed chronic sinusitis. She is currently seeing Tami Ribas - recommended by Dr. Duwayne Heck. She was treated with one round of antibiotics and kept on  flonase, singulair and claritin but had another episode of worsening of coughing spells, she contacted Dr. Tami Ribas recently and was placed on a round of prednisone taper and is doing better now.   Chronic Low Back: she has a long history of low back pain, not secondary to trauma, DDD lumbar spineand also scoliosis.taking Tramadol daily to control symptoms(takes 1 daily, takes 2nd tablets a few times a week), she is also on Skelaxinprn unable to tolerate Flexeril - it makes her sleepy. Pain is constant - average is 3-5/10 but can go up to 8/10 while at work Occasionally the pain radiates to right lower leg,and when radiating it is described as numbnessthat is seldom maybe a couple of times a month. She goes to chiropractor( Dr. Genoveva Ill a month andis stable.  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 She sleepsfrom  around2pm-8:30pmfor about 5-7hours when she sleeps during the day and about 5 hours during the night.Taking Ambien, denies side effects of medication, she is aware of FDA guidelines however unable to sleep with lower dose of Ambien.Unchanged   OA: she has aching pain on left knee, s/pleftknee replaced, only triggered by kneeling down, surgery was done by Dr. Rudene Christians in 2012, doing well at this time  Hyperglycemia: last hgbA1C was 6%, denies polyphagia, polydipsia or polyuria. Trying to eat healthy. We will continue to monitor, recently on a round of prednisone, advised in the future to be very careful with her diet while taking prednisone   HTN: taking valsartan hctz, no side effects, no chest pain or palpitation.BP is at goal at home . She needs refills today   Dyslipidemia: on pravastatin, goal LDL is 100, last visit slightly above that we will recheck yearly, continue current medication for now   Cyst right forearm, present for months, not growing, slightly tender when she applies pressure - such as when arm laying on hard surface/  Patient Active Problem List   Diagnosis Date Noted  . Hyperglycemia 07/13/2017  . Chronic constipation 12/15/2016  . Primary osteoarthritis of left hip 11/05/2015  . Scoliosis 11/05/2015  . Chronic low back pain 10/30/2014  . Benign essential HTN 10/27/2014  . Bradycardia 10/27/2014  . Insomnia 10/27/2014  . Headache, temporal 10/27/2014  . Hypercholesteremia 10/27/2014  . Obesity (BMI 30-39.9) 10/27/2014  . Changing sleep-work schedule, affecting sleep 10/27/2014  . Vitamin D deficiency 10/27/2014  . Arthritis of knee,  degenerative 09/24/2009    Past Surgical History:  Procedure Laterality Date  . ABDOMINAL HYSTERECTOMY    . BREAST BIOPSY Right 01/21/2013   stereo - benign  . COLONOSCOPY WITH PROPOFOL N/A 06/30/2016   Procedure: COLONOSCOPY WITH PROPOFOL;  Surgeon: Jonathon Bellows, MD;  Location: [facility] ENDOSCOPY;  Service: Endoscopy;  Laterality:  N/A;  . KNEE SURGERY Left 2012   Dr. Rudene Christians  . OOPHORECTOMY    . TUBAL LIGATION    . VAGINAL PROLAPSE REPAIR      Family History  Problem Relation Age of Onset  . Breast cancer Sister 58       in year 2013  . Cancer Sister   . Hyperlipidemia Mother   . Diabetes Mother   . Cancer Father        Leukemia  . Hyperlipidemia Father   . Cancer Brother        Lung  . Cancer Sister     Social History   Tobacco Use  . Smoking status: Former Smoker    Packs/day: 0.25    Years: 1.00    Pack years: 0.25    Types: Cigarettes    Start date: 08/13/1967    Quit date: 04/18/1977    Years since quitting: 42.4  . Smokeless tobacco: Never Used  . Tobacco comment: smoking cessation materials not required  Substance Use Topics  . Alcohol use: No    Alcohol/week: 0.0 standard drinks     Current Outpatient Medications:  .  amLODipine (NORVASC) 10 MG tablet, Take 1 tablet (10 mg total) by mouth daily., Disp: 90 tablet, Rfl: 1 .  aspirin EC 81 MG tablet, Take 1 tablet (81 mg total) by mouth daily., Disp: 30 tablet, Rfl: 0 .  Cholecalciferol (VITAMIN D) 2000 UNITS tablet, Take 1 tablet by mouth daily., Disp: , Rfl:  .  fluticasone (FLONASE) 50 MCG/ACT nasal spray, Use 2 spray(s) in each nostril once daily, Disp: 16 g, Rfl: 0 .  loratadine (CLARITIN) 10 MG tablet, Take 1 tablet (10 mg total) by mouth daily., Disp: 30 tablet, Rfl: 0 .  Magnesium 250 MG TABS, Take 1 tablet by mouth daily., Disp: , Rfl:  .  metaxalone (SKELAXIN) 800 MG tablet, Take 1 tablet by mouth three times daily as needed for muscle spasm, Disp: 90 tablet, Rfl: 0 .  montelukast (SINGULAIR) 10 MG tablet, Take 1 tablet (10 mg total) by mouth at bedtime., Disp: 90 tablet, Rfl: 0 .  pravastatin (PRAVACHOL) 40 MG tablet, Take 1 tablet by mouth once daily, Disp: 90 tablet, Rfl: 0 .  traMADol (ULTRAM) 50 MG tablet, Take 1 tablet (50 mg total) by mouth 2 (two) times daily as needed. Fill 01/01/2019 and monthly thereafter, Disp: 60  tablet, Rfl: 2 .  valsartan-hydrochlorothiazide (DIOVAN-HCT) 160-12.5 MG tablet, Take 1 tablet by mouth daily., Disp: 90 tablet, Rfl: 1 .  vitamin C (ASCORBIC ACID) 500 MG tablet, Take 1 tablet by mouth daily., Disp: , Rfl:  .  zolpidem (AMBIEN CR) 12.5 MG CR tablet, TAKE 1 TABLET BY MOUTH AT BEDTIME, Disp: 30 tablet, Rfl: 0  No Known Allergies  I personally reviewed active problem list, medication list, allergies, family history, social history, health maintenance with the patient/caregiver today.   ROS  Constitutional: Negative for fever or weight change.  Respiratory: Negative for cough and shortness of breath.   Cardiovascular: Negative for chest pain or palpitations.  Gastrointestinal: Negative for abdominal pain, no bowel changes.  Musculoskeletal: Negative for gait problem or joint swelling.  Skin: Negative for rash.  Neurological: Negative for dizziness or headache.  No other specific complaints in a complete review of systems (except as listed in HPI above).  Objective  Vitals:   09/05/19 0842  BP: 136/80  Pulse: 94  Resp: 16  Temp: (!) 97.1 F (36.2 C)  TempSrc: Temporal  SpO2: 98%  Weight: 166 lb 12.8 oz (75.7 kg)  Height: 5\' 4"  (1.626 m)    Body mass index is 28.63 kg/m.  Physical Exam  Constitutional: Patient appears well-developed and well-nourished. No distress.  HEENT: head atraumatic, normocephalic, pupils equal and reactive to lightneck supple, throat within normal limits Cardiovascular: Normal rate, regular rhythm and normal heart sounds.  No murmur heard. No BLE edema. Pulmonary/Chest: Effort normal and breath sounds normal. No respiratory distress. Abdominal: Soft.  There is no tenderness. Skin: bruise on left arm, she states bruises easily  Skin: cyst on right forearm, rolls under skin, no tenderness to touch, redness or increase in warmth, round and less than 1 cm in size Psychiatric: Patient has a normal mood and affect. behavior is normal.  Judgment and thought content normal.  Recent Results (from the past 2160 hour(s))  POCT urinalysis dipstick     Status: Abnormal   Collection Time: 09/05/19  8:58 AM  Result Value Ref Range   Color, UA yellow    Clarity, UA clear    Glucose, UA Negative Negative   Bilirubin, UA negative    Ketones, UA negative    Spec Grav, UA 1.015 1.010 - 1.025   Blood, UA large    pH, UA 5.0 5.0 - 8.0   Protein, UA Positive (A) Negative   Urobilinogen, UA 0.2 0.2 or 1.0 E.U./dL   Nitrite, UA negative    Leukocytes, UA Large (3+) (A) Negative   Appearance clear    Odor strong       PHQ2/9: Depression screen [facility] 2/9 09/05/2019 03/21/2019 01/24/2019 12/13/2018 07/26/2018  Decreased Interest 0 0 0 0 0  Down, Depressed, Hopeless 0 0 0 0 0  PHQ - 2 Score 0 0 0 0 0  Altered sleeping 0 0 - 0 0  Tired, decreased energy 0 0 - 0 0  Change in appetite 0 0 - 0 0  Feeling bad or failure about yourself  0 0 - 0 0  Trouble concentrating 0 0 - 0 0  Moving slowly or fidgety/restless 0 0 - 0 0  Suicidal thoughts 0 0 - 0 0  PHQ-9 Score 0 0 - 0 0  Difficult doing work/chores Not difficult at all - - Not difficult at all -    phq 9 is negative   Fall Risk: Fall Risk  09/05/2019 03/21/2019 01/24/2019 12/13/2018 07/26/2018  Falls in the past year? 0 0 0 0 0  Number falls in past yr: 0 0 0 0 0  Injury with Fall? 0 0 0 0 0  Risk for fall due to : - - - - -  Risk for fall due to: Comment - - - - -  Follow up Falls evaluation completed - Falls prevention discussed Falls evaluation completed -     Assessment & Plan  1. Frequent urination  - POCT urinalysis dipstick - Urine Culture - ciprofloxacin (CIPRO) 250 MG tablet; Take 1 tablet (250 mg total) by mouth 2 (two) times daily.  Dispense: 6 tablet; Refill: 0  2. Chronic cough  Doing better   3. Chronic insomnia  - zolpidem (AMBIEN CR) 12.5 MG CR tablet;  Take 1 tablet (12.5 mg total) by mouth at bedtime.  Dispense: 90 tablet; Refill: 0  4. Benign  essential HTN  - valsartan-hydrochlorothiazide (DIOVAN-HCT) 160-12.5 MG tablet; Take 1 tablet by mouth daily.  Dispense: 90 tablet; Refill: 1 - amLODipine (NORVASC) 10 MG tablet; Take 1 tablet (10 mg total) by mouth daily.  Dispense: 90 tablet; Refill: 1  5. Seasonal allergic rhinitis, unspecified trigger  - montelukast (SINGULAIR) 10 MG tablet; Take 1 tablet (10 mg total) by mouth at bedtime.  Dispense: 90 tablet; Refill: 1  6. Hyperglycemia  Recheck labs yearly   7. Changing sleep-work schedule, affecting sleep  - zolpidem (AMBIEN CR) 12.5 MG CR tablet; Take 1 tablet (12.5 mg total) by mouth at bedtime.  Dispense: 90 tablet; Refill: 0  8. Primary osteoarthritis of both knees  - traMADol (ULTRAM) 50 MG tablet; Take 1 tablet (50 mg total) by mouth 2 (two) times daily as needed.  Dispense: 60 tablet; Refill: 2  9. Chronic right-sided low back pain with right-sided sciatica  - traMADol (ULTRAM) 50 MG tablet; Take 1 tablet (50 mg total) by mouth 2 (two) times daily as needed.  Dispense: 60 tablet; Refill: 2 - metaxalone (SKELAXIN) 800 MG tablet; Take 1 tablet (800 mg total) by mouth 3 (three) times daily as needed. for muscle spams  Dispense: 90 tablet; Refill: 0  10. Hypercholesteremia  - pravastatin (PRAVACHOL) 40 MG tablet; Take 1 tablet (40 mg total) by mouth daily.  Dispense: 90 tablet; Refill: 1  11. Wheezing  - montelukast (SINGULAIR) 10 MG tablet; Take 1 tablet (10 mg total) by mouth at bedtime.  Dispense: 90 tablet; Refill: 1  12. Senile purpura (HCC)  Discussed benign condition, to monitor for now, let me know if bruising on trunk or face, but typical in her age on arms and legs   13. Cyst of skin  Reassurance, but we can refer her to general surgeon if starts to bother her

## 2019-09-06 LAB — URINE CULTURE
MICRO NUMBER:: 10502380
Result:: NO GROWTH
SPECIMEN QUALITY:: ADEQUATE

## 2019-09-08 ENCOUNTER — Other Ambulatory Visit: Payer: Self-pay | Admitting: Family Medicine

## 2019-09-08 DIAGNOSIS — N3941 Urge incontinence: Secondary | ICD-10-CM

## 2019-09-08 MED ORDER — TOLTERODINE TARTRATE ER 2 MG PO CP24: 2.0000 mg | ORAL_CAPSULE | Freq: Every day | ORAL | 0 refills | Status: DC

## 2019-09-09 ENCOUNTER — Telehealth: Payer: Self-pay | Admitting: Family Medicine

## 2019-09-09 NOTE — Chronic Care Management (AMB) (Signed)
  Chronic Care Management   Outreach Note  09/09/2019 Name: ELLODY WELTZIN MRN: [id number] DOB: Jul 22, 1945  KALIKA PATRAW is a 74 y.o. year old female who is a primary care patient of Steele Sizer, MD. I reached out to [name] by phone today in response to a referral sent by Ms. Jacklynn Lewis Smiddy's health plan.     An unsuccessful telephone outreach was attempted today. The patient was referred to the case management team for assistance with care management and care coordination.   Follow Up Plan: A HIPPA compliant phone message was left for the patient providing contact information and requesting a return call.  The care management team will reach out to the patient again over the next 7 days.  If patient returns call to provider office, please advise to call [place] at [place], [place], [place], [place] [address] Direct Dial: [phone] [email or web address]

## 2019-09-17 NOTE — Chronic Care Management (AMB) (Signed)
  Chronic Care Management   Outreach Note  09/17/2019 Name: Hayley Howe MRN: [id number] DOB: 08-05-1945  Hayley Howe is a 74 y.o. year old female who is a primary care patient of Steele Sizer, MD. I reached out to Hayley Howe by phone today in response to a referral sent by Hayley Howe's health plan.     A second unsuccessful telephone outreach was attempted today. The patient was referred to the case management team for assistance with care management and care coordination.   Follow Up Plan: A HIPPA compliant phone message was left for the patient providing contact information and requesting a return call.  The care management team will reach out to the patient again over the next 7 days.  If patient returns call to provider office, please advise to call Lena at [place], [place], [place], [place] [address] Direct Dial: [phone] [email or web address]: [email or web address]

## 2019-09-18 ENCOUNTER — Ambulatory Visit
Admission: RE | Admit: 2019-09-18 | Discharge: 2019-09-18 | Disposition: A | Discharge status: Home / Self Care | Payer: BC Managed Care – PPO | Source: Ambulatory Visit | Attending: Family Medicine | Admitting: Family Medicine

## 2019-09-18 ENCOUNTER — Ambulatory Visit (INDEPENDENT_AMBULATORY_CARE_PROVIDER_SITE_OTHER): Payer: BC Managed Care – PPO | Admitting: Family Medicine

## 2019-09-18 ENCOUNTER — Encounter: Payer: Self-pay | Admitting: Family Medicine

## 2019-09-18 ENCOUNTER — Other Ambulatory Visit: Payer: Self-pay

## 2019-09-18 VITALS — BP 164/82 | HR 96 | Temp 97.8°F | Resp 18 | Ht 64.0 in | Wt 171.6 lb

## 2019-09-18 DIAGNOSIS — R05 Cough: Secondary | ICD-10-CM | POA: Diagnosis not present

## 2019-09-18 DIAGNOSIS — R062 Wheezing: Secondary | ICD-10-CM | POA: Insufficient documentation

## 2019-09-18 DIAGNOSIS — R0602 Shortness of breath: Secondary | ICD-10-CM

## 2019-09-18 DIAGNOSIS — R058 Other specified cough: Secondary | ICD-10-CM

## 2019-09-18 DIAGNOSIS — R918 Other nonspecific abnormal finding of lung field: Secondary | ICD-10-CM | POA: Diagnosis not present

## 2019-09-18 MED ORDER — MOXIFLOXACIN HCL 400 MG PO TABS: 400.0000 mg | ORAL_TABLET | Freq: Every day | ORAL | 0 refills | Status: DC

## 2019-09-18 MED ORDER — PREDNISONE 10 MG PO TABS: 10.0000 mg | ORAL_TABLET | Freq: Two times a day (BID) | ORAL | 0 refills | Status: DC

## 2019-09-18 MED ORDER — TRELEGY ELLIPTA 100-62.5-25 MCG/INH IN AEPB: 1.0000 | INHALATION_SPRAY | Freq: Every day | RESPIRATORY_TRACT | 0 refills | Status: DC

## 2019-09-18 NOTE — Progress Notes (Signed)
Name: Hayley Howe   MRN: [id number]    DOB: 03-Apr-1946   Date:09/18/2019       Progress Note  Subjective  Chief Complaint  Chief Complaint  Patient presents with  . Wheezing    for 1 week  . Shortness of Breath  . Cough    HPI  Hayley Howe has a history of chronic cough and was referred to Dr. Duwayne Heck last year, she had evaluation done and sent to see ENT. She states cough had improved, but about 3 weeks ago had more nasal congestion and was given another round of steroids by Dr. Tami Ribas. She felt better, however over the past 5 days she has noticed wheezing, cough and SOB . She has been able to go to work every day. She states symptoms are getting worse gradually but today she is not any worse than yesterday. Her appetite is normal, she denies fever, chills, lower extremity edema, or nasal symptoms. She denies calves pain or swelling  Patient Active Problem List   Diagnosis Date Noted  . Hyperglycemia 07/13/2017  . Chronic constipation 12/15/2016  . Primary osteoarthritis of left hip 11/05/2015  . Scoliosis 11/05/2015  . Chronic low back pain 10/30/2014  . Benign essential HTN 10/27/2014  . Bradycardia 10/27/2014  . Insomnia 10/27/2014  . Headache, temporal 10/27/2014  . Hypercholesteremia 10/27/2014  . Obesity (BMI 30-39.9) 10/27/2014  . Changing sleep-work schedule, affecting sleep 10/27/2014  . Vitamin D deficiency 10/27/2014  . Arthritis of knee, degenerative 09/24/2009    Past Surgical History:  Procedure Laterality Date  . ABDOMINAL HYSTERECTOMY    . BREAST BIOPSY Right 01/21/2013   stereo - benign  . COLONOSCOPY WITH PROPOFOL N/A 06/30/2016   Procedure: COLONOSCOPY WITH PROPOFOL;  Surgeon: Jonathon Bellows, MD;  Location: [facility] END[facility]COPY;  Service: Endoscopy;  Laterality: N/A;  . KNEE SURGERY Left 2012   Dr. Rudene Christians  . OOPHORECTOMY    . TUBAL LIGATION    . VAGINAL PROLAPSE REPAIR      Family History  Problem Relation Age of Onset  . Breast cancer Sister 61       in  year 2013  . Cancer Sister   . Hyperlipidemia Mother   . Diabetes Mother   . Cancer Father        Leukemia  . Hyperlipidemia Father   . Cancer Brother        Lung  . Cancer Sister     Social History   Tobacco Use  . Smoking status: Former Smoker    Packs/day: 0.25    Years: 1.00    Pack years: 0.25    Types: Cigarettes    Start date: 08/13/1967    Quit date: 04/18/1977    Years since quitting: 42.4  . Smokeless tobacco: Never Used  . Tobacco comment: smoking cessation materials not required  Substance Use Topics  . Alcohol use: No    Alcohol/week: 0.0 standard drinks     Current Outpatient Medications:  .  amLODipine (NORVASC) 10 MG tablet, Take 1 tablet (10 mg total) by mouth daily., Disp: 90 tablet, Rfl: 1 .  aspirin EC 81 MG tablet, Take 1 tablet (81 mg total) by mouth daily., Disp: 30 tablet, Rfl: 0 .  Cholecalciferol (VITAMIN D) 2000 UNITS tablet, Take 1 tablet by mouth daily., Disp: , Rfl:  .  ciprofloxacin (CIPRO) 250 MG tablet, Take 1 tablet (250 mg total) by mouth 2 (two) times daily., Disp: 6 tablet, Rfl: 0 .  fluticasone (FLONASE) 50  MCG/ACT nasal spray, Use 2 spray(s) in each nostril once daily, Disp: 16 g, Rfl: 0 .  loratadine (CLARITIN) 10 MG tablet, Take 1 tablet (10 mg total) by mouth daily., Disp: 30 tablet, Rfl: 0 .  Magnesium 250 MG TABS, Take 1 tablet by mouth daily., Disp: , Rfl:  .  metaxalone (SKELAXIN) 800 MG tablet, Take 1 tablet (800 mg total) by mouth 3 (three) times daily as needed. for muscle spams, Disp: 90 tablet, Rfl: 0 .  montelukast (SINGULAIR) 10 MG tablet, Take 1 tablet (10 mg total) by mouth at bedtime., Disp: 90 tablet, Rfl: 1 .  pravastatin (PRAVACHOL) 40 MG tablet, Take 1 tablet (40 mg total) by mouth daily., Disp: 90 tablet, Rfl: 1 .  tolterodine (DETROL LA) 2 MG 24 hr capsule, Take 1 capsule (2 mg total) by mouth daily., Disp: 30 capsule, Rfl: 0 .  traMADol (ULTRAM) 50 MG tablet, Take 1 tablet (50 mg total) by mouth 2 (two) times daily  as needed., Disp: 60 tablet, Rfl: 2 .  valsartan-hydrochlorothiazide (DIOVAN-HCT) 160-12.5 MG tablet, Take 1 tablet by mouth daily., Disp: 90 tablet, Rfl: 1 .  vitamin C (ASCORBIC ACID) 500 MG tablet, Take 1 tablet by mouth daily., Disp: , Rfl:  .  zolpidem (AMBIEN CR) 12.5 MG CR tablet, Take 1 tablet (12.5 mg total) by mouth at bedtime., Disp: 90 tablet, Rfl: 0  No Known Allergies  I personally reviewed active problem list, medication list, allergies, family history, social history, health maintenance with the patient/caregiver today.   ROS  Ten systems reviewed and is negative except as mentioned in HPI   Objective  Vitals:   09/18/19 1518  BP: (!) 164/82  Pulse: 96  Resp: 18  Temp: 97.8 F (36.6 C)  TempSrc: Temporal  SpO2: 98%  Weight: 171 lb 9.6 oz (77.8 kg)  Height: 5\' 4"  (1.626 m)    Body mass index is 29.46 kg/m.  Physical Exam  Constitutional: Patient appears well-developed and well-nourished. Mild respiratory distress  HEENT: head atraumatic, normocephalic, pupils equal and reactive to light,  neck supple Cardiovascular: Normal rate, regular rhythm and normal heart sounds.  No murmur heard. No BLE edema. Pulmonary/Chest: diffuse end inspiratory wheezing, labored breathing , no rhonchi or crackles   Abdominal: Soft.  There is no tenderness. Psychiatric: Patient has a normal mood and affect. behavior is normal. Judgment and thought content normal.   Recent Results (from the past 2160 hour(s))  POCT urinalysis dipstick     Status: Abnormal   Collection Time: 09/05/19  8:58 AM  Result Value Ref Range   Color, UA yellow    Clarity, UA clear    Glucose, UA Negative Negative   Bilirubin, UA negative    Ketones, UA negative    Spec Grav, UA 1.015 1.010 - 1.025   Blood, UA large    pH, UA 5.0 5.0 - 8.0   Protein, UA Positive (A) Negative   Urobilinogen, UA 0.2 0.2 or 1.0 E.U./dL   Nitrite, UA negative    Leukocytes, UA Large (3+) (A) Negative   Appearance  clear    Odor strong   Urine Culture     Status: None   Collection Time: 09/05/19  9:05 AM   Specimen: Urine  Result Value Ref Range   MICRO NUMBER: [id number]    SPECIMEN QUALITY: Adequate    Sample Source URINE    STATUS: FINAL    Result: No Growth       PHQ2/9: Depression screen PHQ  2/9 09/18/2019 09/05/2019 03/21/2019 01/24/2019 12/13/2018  Decreased Interest 0 0 0 0 0  Down, Depressed, Hopeless 0 0 0 0 0  PHQ - 2 Score 0 0 0 0 0  Altered sleeping 0 0 0 - 0  Tired, decreased energy 0 0 0 - 0  Change in appetite 0 0 0 - 0  Feeling bad or failure about yourself  0 0 0 - 0  Trouble concentrating 0 0 0 - 0  Moving slowly or fidgety/restless 0 0 0 - 0  Suicidal thoughts 0 0 0 - 0  PHQ-9 Score 0 0 0 - 0  Difficult doing work/chores Not difficult at all Not difficult at all - - Not difficult at all  Some recent data might be hidden    phq 9 is negative   Fall Risk: Fall Risk  09/18/2019 09/05/2019 03/21/2019 01/24/2019 12/13/2018  Falls in the past year? 0 0 0 0 0  Number falls in past yr: 0 0 0 0 0  Injury with Fall? 0 0 0 0 0  Risk for fall due to : - - - - -  Risk for fall due to: Comment - - - - -  Follow up Falls evaluation completed Falls evaluation completed - Falls prevention discussed Falls evaluation completed    Functional Status Survey: Is the patient deaf or have difficulty hearing?: No Does the patient have difficulty seeing, even when wearing glasses/contacts?: No Does the patient have difficulty concentrating, remembering, or making decisions?: No Does the patient have difficulty walking or climbing stairs?: No Does the patient have difficulty dressing or bathing?: No Does the patient have difficulty doing errands alone such as visiting a doctor's office or shopping?: No   Assessment & Plan  1. Wheezing  - DG Chest 2 View; Future - CBC with Differential/Platelet - predniSONE (DELTASONE) 10 MG tablet; Take 1 tablet (10 mg total) by mouth 2 (two) times daily  with a meal.  Dispense: 10 tablet; Refill: 0 - Fluticasone-Umeclidin-Vilant (TRELEGY ELLIPTA) 100-62.5-25 MCG/INH AEPB; Inhale 1 puff into the lungs daily.  Dispense: 60 each; Refill: 0 - BASIC METABOLIC PANEL WITH GFR - moxifloxacin (AVELOX) 400 MG tablet; Take 1 tablet (400 mg total) by mouth daily.  Dispense: 7 tablet; Refill: 0  2. SOB (shortness of breath)  - DG Chest 2 View; Future - CBC with Differential/Platelet - predniSONE (DELTASONE) 10 MG tablet; Take 1 tablet (10 mg total) by mouth 2 (two) times daily with a meal.  Dispense: 10 tablet; Refill: 0 - Fluticasone-Umeclidin-Vilant (TRELEGY ELLIPTA) 100-62.5-25 MCG/INH AEPB; Inhale 1 puff into the lungs daily.  Dispense: 60 each; Refill: 0 - BASIC METABOLIC PANEL WITH GFR - moxifloxacin (AVELOX) 400 MG tablet; Take 1 tablet (400 mg total) by mouth daily.  Dispense: 7 tablet; Refill: 0  3. Dry cough  - DG Chest 2 View; Future - CBC with Differential/Platelet - predniSONE (DELTASONE) 10 MG tablet; Take 1 tablet (10 mg total) by mouth 2 (two) times daily with a meal.  Dispense: 10 tablet; Refill: 0 - Fluticasone-Umeclidin-Vilant (TRELEGY ELLIPTA) 100-62.5-25 MCG/INH AEPB; Inhale 1 puff into the lungs daily.  Dispense: 60 each; Refill: 0 - BASIC METABOLIC PANEL WITH GFR - moxifloxacin (AVELOX) 400 MG tablet; Take 1 tablet (400 mg total) by mouth daily.  Dispense: 7 tablet; Refill: 0  Advised patient to go to [facility] if no improvement of symptoms or if she gets worse tonight, to call 911 Also contacted Dr. Duwayne Heck her pulmonologist by chat message on Epic  and asked for an appointment. Her CMA offered the week of June 16 th. Called patient and LMOM to call Dr. Duwayne Heck and set up her appointment. Try to call another contact person, but the number for her daughter is also patient's number

## 2019-09-19 ENCOUNTER — Other Ambulatory Visit: Payer: Self-pay | Admitting: Family Medicine

## 2019-09-19 ENCOUNTER — Telehealth: Payer: Self-pay | Admitting: Family Medicine

## 2019-09-19 LAB — CBC WITH DIFFERENTIAL/PLATELET
Absolute Monocytes: 718 cells/uL (ref 200–950)
Basophils Absolute: 133 cells/uL (ref 0–200)
Basophils Relative: 1.8 %
Eosinophils Absolute: 1332 cells/uL — ABNORMAL HIGH (ref 15–500)
Eosinophils Relative: 18 %
HCT: 37.5 % (ref 35.0–45.0)
Hemoglobin: 12.1 g/dL (ref 11.7–15.5)
Lymphs Abs: 2449 cells/uL (ref 850–3900)
MCH: 26.9 pg — ABNORMAL LOW (ref 27.0–33.0)
MCHC: 32.3 g/dL (ref 32.0–36.0)
MCV: 83.3 fL (ref 80.0–100.0)
MPV: 11.1 fL (ref 7.5–12.5)
Monocytes Relative: 9.7 %
Neutro Abs: 2768 cells/uL (ref 1500–7800)
Neutrophils Relative %: 37.4 %
Platelets: 286 10*3/uL (ref 140–400)
RBC: 4.5 10*6/uL (ref 3.80–5.10)
RDW: 14.4 % (ref 11.0–15.0)
Total Lymphocyte: 33.1 %
WBC: 7.4 10*3/uL (ref 3.8–10.8)

## 2019-09-19 LAB — BASIC METABOLIC PANEL WITH GFR
BUN/Creatinine Ratio: 18 (calc) (ref 6–22)
BUN: 19 mg/dL (ref 7–25)
CO2: 28 mmol/L (ref 20–32)
Calcium: 9.9 mg/dL (ref 8.6–10.4)
Chloride: 102 mmol/L (ref 98–110)
Creat: 1.03 mg/dL — ABNORMAL HIGH (ref 0.60–0.93)
GFR, Est African American: 63 mL/min/{1.73_m2} (ref >=60)
GFR, Est Non African American: 54 mL/min/{1.73_m2} — ABNORMAL LOW (ref >=60)
Glucose, Bld: 83 mg/dL (ref 65–99)
Potassium: 4.3 mmol/L (ref 3.5–5.3)
Sodium: 140 mmol/L (ref 135–146)

## 2019-09-19 MED ORDER — BREO ELLIPTA 200-25 MCG/INH IN AEPB: 1.0000 | INHALATION_SPRAY | Freq: Every day | RESPIRATORY_TRACT | 0 refills | Status: DC

## 2019-09-19 NOTE — Telephone Encounter (Signed)
Patient called in stating she was given a card for a inhaler and was told that didn't qualify for it and the cost is over $100 and wants to know if she can be given another prescription that is affordable.please advise

## 2019-09-19 NOTE — Telephone Encounter (Signed)
Pt notified and will pick up tomorrow.

## 2019-09-23 NOTE — Chronic Care Management (AMB) (Signed)
  Chronic Care Management   Note  09/23/2019 Name: Hayley Howe MRN: [id number] DOB: [birth year]  [name] is a 74 y.o. year old female who is a primary care patient of Steele Sizer, MD. I reached out to [name] by phone today in response to a referral sent by [name]'s health plan.     [name] was given information about Chronic Care Management services today including:  1. CCM service includes personalized support from designated clinical staff supervised by her physician, including individualized plan of care and coordination with other care providers 2. 24/7 contact phone numbers for assistance for urgent and routine care needs. 3. Service will only be billed when office clinical staff spend 20 minutes or more in a month to coordinate care. 4. Only one practitioner may furnish and bill the service in a calendar month. 5. The patient may stop CCM services at any time (effective at the end of the month) by phone call to the office staff. 6. The patient will be responsible for cost sharing (co-pay) of up to 20% of the service fee (after annual deductible is met).  Patient did not agree to enrollment in care management services and does not wish to consider at this time.  Follow up plan: The patient has been provided with contact information for the care management team and has been advised to call with any health related questions or concerns.   Noreene Larsson, [place], [place], [place] [address] Direct Dial: [phone] [email or web address] Website: [email or web address]

## 2019-10-03 ENCOUNTER — Other Ambulatory Visit
Admission: RE | Admit: 2019-10-03 | Discharge: 2019-10-03 | Disposition: A | Discharge status: Home / Self Care | Payer: BC Managed Care – PPO | Attending: Pulmonary Disease | Admitting: Pulmonary Disease

## 2019-10-03 ENCOUNTER — Ambulatory Visit (INDEPENDENT_AMBULATORY_CARE_PROVIDER_SITE_OTHER): Payer: BC Managed Care – PPO | Admitting: Pulmonary Disease

## 2019-10-03 ENCOUNTER — Encounter: Payer: Self-pay | Admitting: Pulmonary Disease

## 2019-10-03 ENCOUNTER — Other Ambulatory Visit: Payer: Self-pay

## 2019-10-03 VITALS — BP 140/78 | HR 61 | Temp 97.5°F | Ht 64.0 in | Wt 171.0 lb

## 2019-10-03 DIAGNOSIS — J324 Chronic pansinusitis: Secondary | ICD-10-CM

## 2019-10-03 DIAGNOSIS — R05 Cough: Secondary | ICD-10-CM

## 2019-10-03 DIAGNOSIS — J683 Other acute and subacute respiratory conditions due to chemicals, gases, fumes and vapors: Secondary | ICD-10-CM

## 2019-10-03 DIAGNOSIS — R059 Cough, unspecified: Secondary | ICD-10-CM

## 2019-10-03 LAB — CBC WITH DIFFERENTIAL/PLATELET
Abs Immature Granulocytes: 0.02 10*3/uL (ref 0.00–0.07)
Basophils Absolute: 0.1 10*3/uL (ref 0.0–0.1)
Basophils Relative: 1 %
Eosinophils Absolute: 1.2 10*3/uL — ABNORMAL HIGH (ref 0.0–0.5)
Eosinophils Relative: 19 %
HCT: 36.4 % (ref 36.0–46.0)
Hemoglobin: 12 g/dL (ref 12.0–15.0)
Immature Granulocytes: 0 %
Lymphocytes Relative: 33 %
Lymphs Abs: 2.1 10*3/uL (ref 0.7–4.0)
MCH: 27 pg (ref 26.0–34.0)
MCHC: 33 g/dL (ref 30.0–36.0)
MCV: 82 fL (ref 80.0–100.0)
Monocytes Absolute: 0.4 10*3/uL (ref 0.1–1.0)
Monocytes Relative: 6 %
Neutro Abs: 2.6 10*3/uL (ref 1.7–7.7)
Neutrophils Relative %: 41 %
Platelets: 313 10*3/uL (ref 150–400)
RBC: 4.44 MIL/uL (ref 3.87–5.11)
RDW: 15.9 % — ABNORMAL HIGH (ref 11.5–15.5)
WBC: 6.4 10*3/uL (ref 4.0–10.5)
nRBC: 0 % (ref 0.0–0.2)

## 2019-10-03 MED ORDER — BREO ELLIPTA 200-25 MCG/INH IN AEPB: 1.0000 | INHALATION_SPRAY | Freq: Every day | RESPIRATORY_TRACT | 11 refills | Status: DC

## 2019-10-03 MED ORDER — BREO ELLIPTA 200-25 MCG/INH IN AEPB: 1.0000 | INHALATION_SPRAY | Freq: Every day | RESPIRATORY_TRACT | 0 refills | Status: AC

## 2019-10-03 NOTE — Patient Instructions (Signed)
Get some blood work today.  Please make another appointment with Dr. Tami Ribas I think your cough is related to your sinus issues.  We will continue the Breo Ellipta 200/25, 1 inhalation daily, I recommend that you rinse well after you use it you may put a little baking soda in the rinse water.  We will see you in follow-up in 4 to 6 weeks time call sooner should any new difficulties arise you may see a nurse practitioner at that time if I am not available.

## 2019-10-03 NOTE — Progress Notes (Signed)
Subjective:    Patient ID: Hayley Howe, female    DOB: 1945-05-19, 74 y.o.   MRN: [id number]  HPI Patient is a 74 year old remote former smoker who follows up on the issue of cough.   Review of Systems A 10 point review of systems was performed and it is as noted above otherwise negative.  Her cough is related to upper airway cough syndrome due to chronic postnasal drip from severe chronic pansinusitis.  Pansinusitis has been documented by maxillofacial 2 CTs.  She follows with Dr. Tami Ribas for this issue.   However approximately 3-4 weeks ago she had another flare of her nasal symptoms and her cough became worse.  She also was noticing wheezing and shortness of breath.  She saw her primary care physician on 2 June and required a taper of prednisone he was given a trial of Trelegy Ellipta, her secretions have been somewhat dry.  Her cough with very little expectoration.  She has globus pharyngeus.  She feels her secretions in the back of her throat.  She has not had any fevers, chills or sweats.  She continues to have problems with chronic nasal congestion and sinus pressure.  As noted she has had 2 CTs that have shown extensive pansinusitis.  The has not had any chest pain, or gastroesophageal reflux symptoms.   No Known Allergies  Current Meds  Medication Sig   amLODipine (NORVASC) 10 MG tablet Take 1 tablet (10 mg total) by mouth daily.   aspirin EC 81 MG tablet Take 1 tablet (81 mg total) by mouth daily.   Cholecalciferol (VITAMIN D) 2000 UNITS tablet Take 1 tablet by mouth daily.   fluticasone (FLONASE) 50 MCG/ACT nasal spray Use 2 spray(s) in each nostril once daily   fluticasone furoate-vilanterol (BREO ELLIPTA) 200-25 MCG/INH AEPB Inhale 1 puff into the lungs daily.   loratadine (CLARITIN) 10 MG tablet Take 1 tablet (10 mg total) by mouth daily.   Magnesium 250 MG TABS Take 1 tablet by mouth daily.   metaxalone (SKELAXIN) 800 MG tablet Take 1 tablet (800 mg total) by  mouth 3 (three) times daily as needed. for muscle spams   montelukast (SINGULAIR) 10 MG tablet Take 1 tablet (10 mg total) by mouth at bedtime.   moxifloxacin (AVELOX) 400 MG tablet Take 1 tablet (400 mg total) by mouth daily.   pravastatin (PRAVACHOL) 40 MG tablet Take 1 tablet (40 mg total) by mouth daily.   tolterodine (DETROL LA) 2 MG 24 hr capsule Take 1 capsule (2 mg total) by mouth daily.   traMADol (ULTRAM) 50 MG tablet Take 1 tablet (50 mg total) by mouth 2 (two) times daily as needed.   valsartan-hydrochlorothiazide (DIOVAN-HCT) 160-12.5 MG tablet Take 1 tablet by mouth daily.   vitamin C (ASCORBIC ACID) 500 MG tablet Take 1 tablet by mouth daily.   zolpidem (AMBIEN CR) 12.5 MG CR tablet Take 1 tablet (12.5 mg total) by mouth at bedtime.   [DISCONTINUED] ciprofloxacin (CIPRO) 250 MG tablet Take 1 tablet (250 mg total) by mouth 2 (two) times daily.   [DISCONTINUED] predniSONE (DELTASONE) 10 MG tablet Take 1 tablet (10 mg total) by mouth 2 (two) times daily with a meal.   Immunization History  Administered Date(s) Administered   Fluad Quad(high Dose 72+) 12/13/2018   Influenza, High Dose Seasonal PF 01/20/2016, 12/15/2016, 01/19/2018   Influenza, Seasonal, Injecte, Preservative Fre 02/18/2010, 02/11/2011, 03/29/2012   Influenza,inj,Quad PF,6+ Mos 01/03/2013, 01/10/2014, 03/05/2015   Influenza-Unspecified 12/17/2013   Moderna SARS-COVID-2  Vaccination 05/26/2019, 06/26/2019   Pneumococcal Conjugate-13 06/19/2014   Pneumococcal Polysaccharide-23 02/18/2010, 07/02/2015   Td 08/12/2016   Tdap 09/14/2007   Zoster 08/20/2015       Objective:   Physical Exam BP 140/78 (BP Location: Left Arm, Cuff Size: Normal)    Pulse 61    Temp (!) 97.5 F (36.4 C) (Temporal)    Ht 5\' 4"  (1.626 m)    Wt 171 lb (77.6 kg)    SpO2 100%    BMI 29.35 kg/m   GENERAL: Well-developed, overweight woman in no acute distress, well groomed.  Fully ambulatory. HEAD: Normocephalic,  atraumatic.  Nasal quality to speech. EYES: Pupils equal, round, reactive to light.  No scleral icterus.  MOUTH: Nose/mouth/throat not examined due to masking requirements for COVID 19. NECK: Supple. No thyromegaly. Trachea midline. No JVD.  No adenopathy. PULMONARY: Lungs clear to auscultation bilaterally.  Good air entry bilaterally. CARDIOVASCULAR: S1 and S2. Regular rate and rhythm.  Grade 1/6 systolic ejection murmur left sternal border.  No gallops or rubs.   GASTROINTESTINAL: No distention MUSCULOSKELETAL: No joint deformity, no clubbing, no edema.  NEUROLOGIC: Awake, alert, no focal deficits.  No gait disturbance noted during ambulation.  Speech is fluent. SKIN: Intact,warm,dry.  No overt rashes noted. PSYCH: Mood and behavior appropriate.  Chest x-ray performed 19 September 2019 independently reviewed, results as below:     Assessment & Plan:     ICD-10-CM   1. Cough  R05 Allergens w/Total IgE Area 2    CBC w/Diff   Related to postnasal drip syndrome (upper airway cough syndrome) She has SEVERE pansinusitis, chronic Recommend reevaluation by ENT Continue nasal hygiene  2. Chronic pansinusitis  J32.4    Recommend reevaluation by ENT (Dr. Tami Ribas) Patient to make appointment May need sinus surgery  3. Reactive airways dysfunction syndrome, moderate persistent, uncomplicated (HCC)  T41.9    Most recent chest x-ray shows bronchial cuffing Airway inflammation due to chronic sinus disease Continue Breo Ellipta   Meds ordered this encounter  Medications   fluticasone furoate-vilanterol (BREO ELLIPTA) 200-25 MCG/INH AEPB    Sig: Inhale 1 puff into the lungs daily for 1 day.    Dispense:  28 each    Refill:  0    Order Specific Question:   Lot Number?    Answer:   [id number]    Order Specific Question:   Expiration Date?    Answer:   02/17/2020    Order Specific Question:   Manufacturer?    Answer:   [employer] [12]    Order Specific Question:   Quantity    Answer:   2    fluticasone furoate-vilanterol (BREO ELLIPTA) 200-25 MCG/INH AEPB    Sig: Inhale 1 puff into the lungs daily.    Dispense:  28 each    Refill:  11   Discussion:  Patient's cough likely related to chronic pansinusitis and upper airway cough syndrome due to postnasal drip.  I have recommended that she call Dr. Tami Ribas and revisit with him.  Her most recent CT of the sinuses showed very severe pansinusitis and this will continue to be an issue.  She may require sinus surgery to relieve her issues.  For completeness, we have ordered allergen panel and CBC with differential to evaluate for potential eosinophilia.  Her cough is related to her upper airway issues.  Her chest x-ray shows bronchial cuffing which is consistent with inflammation.  We will give her a trial of Breo Ellipta 200/25, 1 inhalation  daily and prescription was also sent to her pharmacy.  Trelegy would be a good option however currently her secretions are somewhat dry and the LAMA component in the Trelegy can dry secretions.  We will see how she does with the [facility].  Bidart her with a months worth of Breo.  Renold Don, MD [facility]   *This note was dictated using voice recognition software/Dragon.  Despite best efforts to proofread, errors can occur which can change the meaning.  Any change was purely unintentional.

## 2019-10-08 LAB — ALLERGENS W/TOTAL IGE AREA 2

## 2019-10-10 ENCOUNTER — Telehealth: Payer: Self-pay | Admitting: Pulmonary Disease

## 2019-10-10 NOTE — Telephone Encounter (Signed)
Hayley Pita, MD  [name], CMA No allergies on the panel tested. She did have some elevated allergy cells on her blood but this is likely related to her sinus issues. Following with ENT.    Lm for pt.

## 2019-10-11 NOTE — Telephone Encounter (Signed)
Lm x2 for pt.  

## 2019-10-11 NOTE — Telephone Encounter (Signed)
Lm x3.  Letter has been mailed to address on file.

## 2019-10-14 ENCOUNTER — Ambulatory Visit
Admission: RE | Admit: 2019-10-14 | Discharge: 2019-10-14 | Disposition: A | Discharge status: Home / Self Care | Payer: BC Managed Care – PPO | Attending: Unknown Physician Specialty | Admitting: Unknown Physician Specialty

## 2019-10-14 ENCOUNTER — Other Ambulatory Visit: Payer: Self-pay | Admitting: Unknown Physician Specialty

## 2019-10-14 ENCOUNTER — Ambulatory Visit
Admission: RE | Admit: 2019-10-14 | Discharge: 2019-10-14 | Disposition: A | Discharge status: Home / Self Care | Payer: BC Managed Care – PPO | Source: Ambulatory Visit | Attending: Unknown Physician Specialty | Admitting: Unknown Physician Specialty

## 2019-10-14 DIAGNOSIS — J320 Chronic maxillary sinusitis: Secondary | ICD-10-CM | POA: Diagnosis not present

## 2019-10-14 DIAGNOSIS — J324 Chronic pansinusitis: Secondary | ICD-10-CM | POA: Insufficient documentation

## 2019-10-14 DIAGNOSIS — J329 Chronic sinusitis, unspecified: Secondary | ICD-10-CM | POA: Diagnosis not present

## 2019-10-22 ENCOUNTER — Telehealth: Payer: Self-pay | Admitting: Pulmonary Disease

## 2019-10-22 NOTE — Telephone Encounter (Signed)
Hayley Pita, MD  [name], CMA No allergies on the panel tested. She did have some elevated allergy cells on her blood but this is likely related to her sinus issues. Following with ENT  Pt is aware of results and voiced her understanding.  Nothing further is needed.

## 2019-10-30 DIAGNOSIS — J329 Chronic sinusitis, unspecified: Secondary | ICD-10-CM | POA: Diagnosis not present

## 2019-11-07 ENCOUNTER — Other Ambulatory Visit: Payer: Self-pay

## 2019-11-07 ENCOUNTER — Ambulatory Visit: Payer: Medicare HMO | Admitting: Pulmonary Disease

## 2019-11-07 ENCOUNTER — Encounter: Payer: Self-pay | Admitting: Pulmonary Disease

## 2019-11-07 VITALS — BP 132/78 | HR 65 | Temp 98.2°F | Ht 64.0 in | Wt 175.8 lb

## 2019-11-07 DIAGNOSIS — J324 Chronic pansinusitis: Secondary | ICD-10-CM

## 2019-11-07 DIAGNOSIS — J683 Other acute and subacute respiratory conditions due to chemicals, gases, fumes and vapors: Secondary | ICD-10-CM

## 2019-11-07 DIAGNOSIS — R05 Cough: Secondary | ICD-10-CM

## 2019-11-07 DIAGNOSIS — R059 Cough, unspecified: Secondary | ICD-10-CM

## 2019-11-07 NOTE — Progress Notes (Signed)
    Assessment & Plan:  1. Chronic pansinusitis (Primary)  2. Reactive airways dysfunction syndrome, moderate persistent, uncomplicated (HCC)  3. Cough   Patient Instructions  We switch your nasal spray to Dymista  (azelastine /fluticasone ) 1 spray to each nostril twice a day.  Let us  know if you have any difficulty getting this nasal spray.   We will see you in follow-up in 2 to 3 months time.  Continue using the Breo as you are doing.  Call sooner should any new problems arise.  Please note: late entry documentation due to logistical difficulties during COVID-19 pandemic. This note is filed for information purposes only, and is not intended to be used for billing, nor does it represent the full scope/nature of the visit in question. Please see any associated scanned media linked to date of encounter for additional pertinent information.  Subjective:    HPI: Hayley Howe is a 74 y.o. female presenting to the pulmonology clinic on 11/07/2019 with report of: Follow-up (sinus congestion)     Outpatient Encounter Medications as of 11/07/2019  Medication Sig Note   aspirin  EC 81 MG tablet Take 1 tablet (81 mg total) by mouth daily.    Cholecalciferol  (VITAMIN D ) 2000 UNITS tablet Take 1 tablet by mouth daily.    loratadine  (CLARITIN ) 10 MG tablet Take 1 tablet (10 mg total) by mouth daily.    Magnesium  250 MG TABS Take 1 tablet by mouth daily.    vitamin C (ASCORBIC ACID ) 500 MG tablet Take 1 tablet by mouth daily.    [DISCONTINUED] amLODipine  (NORVASC ) 10 MG tablet Take 1 tablet (10 mg total) by mouth daily.    [DISCONTINUED] fluticasone  (FLONASE ) 50 MCG/ACT nasal spray Use 2 spray(s) in each nostril once daily    [DISCONTINUED] fluticasone  furoate-vilanterol (BREO ELLIPTA ) 200-25 MCG/INH AEPB Inhale 1 puff into the lungs daily.    [DISCONTINUED] fluticasone  furoate-vilanterol (BREO ELLIPTA ) 200-25 MCG/INH AEPB Inhale 1 puff into the lungs daily.    [DISCONTINUED] metaxalone   (SKELAXIN ) 800 MG tablet Take 1 tablet (800 mg total) by mouth 3 (three) times daily as needed. for muscle spams 04/21/2020: PRN only not often    [DISCONTINUED] montelukast  (SINGULAIR ) 10 MG tablet Take 1 tablet (10 mg total) by mouth at bedtime.    [DISCONTINUED] moxifloxacin  (AVELOX ) 400 MG tablet Take 1 tablet (400 mg total) by mouth daily.    [DISCONTINUED] pravastatin  (PRAVACHOL ) 40 MG tablet Take 1 tablet (40 mg total) by mouth daily.    [DISCONTINUED] tolterodine  (DETROL  LA) 2 MG 24 hr capsule Take 1 capsule (2 mg total) by mouth daily.    [DISCONTINUED] traMADol  (ULTRAM ) 50 MG tablet Take 1 tablet (50 mg total) by mouth 2 (two) times daily as needed.    [DISCONTINUED] valsartan -hydrochlorothiazide  (DIOVAN -HCT) 160-12.5 MG tablet Take 1 tablet by mouth daily.    [DISCONTINUED] zolpidem  (AMBIEN  CR) 12.5 MG CR tablet Take 1 tablet (12.5 mg total) by mouth at bedtime.    No facility-administered encounter medications on file as of 11/07/2019.      Objective:   Vitals:   11/07/19 0912  BP: 132/78  Pulse: 65  Temp: 98.2 F (36.8 C)  Height: 5' 4 (1.626 m)  Weight: 175 lb 12.8 oz (79.7 kg)  SpO2: 100% Comment: Room air  TempSrc: Oral  BMI (Calculated): 30.16     Physical exam documentation is limited by delayed entry of information.

## 2019-11-07 NOTE — Patient Instructions (Signed)
We switch your nasal spray to Dymista (azelastine/fluticasone) 1 spray to each nostril twice a day.  Let us know if you have any difficulty getting this nasal spray.   We will see you in follow-up in 2 to 3 months time.  Continue using the [facility] as you are doing.  Call sooner should any new problems arise.

## 2019-12-06 ENCOUNTER — Telehealth: Payer: Self-pay | Admitting: Pulmonary Disease

## 2019-12-06 MED ORDER — AZITHROMYCIN 250 MG PO TABS: ORAL_TABLET | ORAL | 0 refills | Status: AC

## 2019-12-06 MED ORDER — PREDNISONE 10 MG (21) PO TBPK: ORAL_TABLET | ORAL | 0 refills | Status: DC

## 2019-12-06 NOTE — Telephone Encounter (Signed)
Left message for patient

## 2019-12-06 NOTE — Telephone Encounter (Signed)
Patient is aware of below recommendations and voiced her understanding.  Rx for prednisone and zpak has been sent to preferred pharmacy.  Nothing further is needed at this time.

## 2019-12-06 NOTE — Telephone Encounter (Signed)
Called and spoke to patient. Patient reports of wheezing, increased sob with exertion, head congestion and productive with clear mucus. Head congestion and cough has been present for 2 weeks. Wheezing and sob started this week. Patient has had both covid vaccines. No sick contacts that she is aware of.  Denied fever, chills or sweats.  Dr. Patsey Berthold, please advise. thanks

## 2019-12-06 NOTE — Telephone Encounter (Signed)
List prescribed a prednisone taper pack of 21 tablets, follow directions in the pack.  Also on azithromycin Z-Pak.

## 2020-01-08 NOTE — Progress Notes (Signed)
Name: Hayley Howe   MRN: [id number]    DOB: 1945/04/28   Date:01/09/2020       Progress Note  Subjective  Chief Complaint  Follow up: Senile Purpura, hyperglycemia, osteoarthritis, HTN  HPI   Urinary frequency: she has noticed increase in urinary frequency over the past month, she states more urgency than usual , nocturia is stable, no dysuria, no hematuria , denies abdominal pain or flank pain, no fever or chills. She takes Detrol prn only she states it helps with symptoms , we will give her a 90 day supply   Chronic sinusitis: she was seen by Dr. Duwayne Heck - pulmonologist , had a normal spirometry, CT showed some scarring, CT sinus showed chronic sinusitis. She is currently seeing Tami Ribas - recommended by Dr. Duwayne Heck, she took another round of prednisone and antibiotics about one month ago and is doing better, cough is significantly better, congestion has improved   Chronic Low Back: she has a long history of low back pain, not secondary to trauma, DDD lumbar spineand also scoliosis.taking Tramadol daily to control symptoms(takes 1 daily, takes 2nd tablets a few times a week), she is also on Skelaxinprn unable to tolerate Flexeril - it makes her sleepy. Pain is constant - average is 3-5/10 but can go up to 8/10 while at work Occasionally the pain radiates to right lower leg,and when radiating it is described as numbnessthat is seldom maybe a couple of times a month. She goes to chiropractor( Dr. Genoveva Ill a month. 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 She sleepsfrom around2pm-8:30pmfor about 5-7hours when she sleeps during the dayTaking Ambien, denies side effects of medication, she is aware of FDA guidelines however unable to sleep with lower dose of Ambien.She does not need a refill today but will contact me when due for refills prior to her next follow up  OA: she has aching pain on left knee, s/pleftknee  replaced, only triggered by kneeling down, surgery was done by Dr. Rudene Christians in 2012, she states not recent problems   Hyperglycemia: last hgbA1C was6%, denies polyphagia, polydipsia or polyuria. Trying to eat healthy. We  Had multiple rounds of prednisone this year, we will recheck A1C  HTN: taking valsartan hctz, and also Norvasc, she has noticed intermittent lightheadedness  when leaning forward, bp slightly elevated today. She also has noticed mild headache.  Discussed getting up slowly   Dyslipidemia: on pravastatin, goal LDL is 100, last visit slightly above that we will recheck level today    Cyst right forearm, present for months, not growing, slightly tender when she applies pressure - such as when arm laying on hard surface, but she states not bothering her lately   Grieving: sister got sick back in May 11, 2022, and died July 221, they were best friends and she has been struggling, discussed hospice counseling . Discussed complicated grieve and the need to reach out to me if she noticed worsening of symptoms   Senile Purpura: she has one on left upper arm, stable.   Patient Active Problem List   Diagnosis Date Noted  . Hyperglycemia 07/13/2017  . Chronic constipation 12/15/2016  . Primary osteoarthritis of left hip 11/05/2015  . Scoliosis 11/05/2015  . Chronic low back pain 10/30/2014  . Benign essential HTN 10/27/2014  . Bradycardia 10/27/2014  . Insomnia 10/27/2014  . Headache, temporal 10/27/2014  . Hypercholesteremia 10/27/2014  . Obesity (BMI 30-39.9) 10/27/2014  . Changing sleep-work schedule, affecting sleep 10/27/2014  .  Vitamin D deficiency 10/27/2014  . Arthritis of knee, degenerative 09/24/2009    Past Surgical History:  Procedure Laterality Date  . ABDOMINAL HYSTERECTOMY    . BREAST BIOPSY Right 01/21/2013   stereo - benign  . COLONOSCOPY WITH PROPOFOL N/A 06/30/2016   Procedure: COLONOSCOPY WITH PROPOFOL;  Surgeon: Jonathon Bellows, MD;  Location: [facility] ENDOSCOPY;   Service: Endoscopy;  Laterality: N/A;  . KNEE SURGERY Left 2012   Dr. Rudene Christians  . OOPHORECTOMY    . TUBAL LIGATION    . VAGINAL PROLAPSE REPAIR      Family History  Problem Relation Age of Onset  . Breast cancer Sister 34       in year 2013  . Hyperlipidemia Mother   . Diabetes Mother   . Cancer Father        Leukemia  . Hyperlipidemia Father   . Lung cancer Brother   . Breast cancer Sister   . Heart failure Brother     Social History   Tobacco Use  . Smoking status: Former Smoker    Packs/day: 0.25    Years: 1.00    Pack years: 0.25    Types: Cigarettes    Start date: 08/13/1967    Quit date: 04/18/1977    Years since quitting: 42.7  . Smokeless tobacco: Never Used  . Tobacco comment: smoking cessation materials not required  Substance Use Topics  . Alcohol use: No    Alcohol/week: 0.0 standard drinks     Current Outpatient Medications:  .  amLODipine (NORVASC) 10 MG tablet, Take 1 tablet (10 mg total) by mouth daily., Disp: 90 tablet, Rfl: 1 .  aspirin EC 81 MG tablet, Take 1 tablet (81 mg total) by mouth daily., Disp: 30 tablet, Rfl: 0 .  Cholecalciferol (VITAMIN D) 2000 UNITS tablet, Take 1 tablet by mouth daily., Disp: , Rfl:  .  fluticasone (FLONASE) 50 MCG/ACT nasal spray, Use 2 spray(s) in each nostril once daily, Disp: 16 g, Rfl: 0 .  fluticasone furoate-vilanterol (BREO ELLIPTA) 200-25 MCG/INH AEPB, Inhale 1 puff into the lungs daily., Disp: 28 each, Rfl: 11 .  loratadine (CLARITIN) 10 MG tablet, Take 1 tablet (10 mg total) by mouth daily., Disp: 30 tablet, Rfl: 0 .  Magnesium 250 MG TABS, Take 1 tablet by mouth daily., Disp: , Rfl:  .  metaxalone (SKELAXIN) 800 MG tablet, Take 1 tablet (800 mg total) by mouth 3 (three) times daily as needed. for muscle spams, Disp: 90 tablet, Rfl: 0 .  montelukast (SINGULAIR) 10 MG tablet, Take 1 tablet (10 mg total) by mouth at bedtime., Disp: 90 tablet, Rfl: 1 .  pravastatin (PRAVACHOL) 40 MG tablet, Take 1 tablet (40 mg  total) by mouth daily., Disp: 90 tablet, Rfl: 1 .  tolterodine (DETROL LA) 2 MG 24 hr capsule, Take 1 capsule (2 mg total) by mouth daily., Disp: 90 capsule, Rfl: 1 .  traMADol (ULTRAM) 50 MG tablet, Take 1 tablet (50 mg total) by mouth 2 (two) times daily as needed., Disp: 60 tablet, Rfl: 2 .  valsartan-hydrochlorothiazide (DIOVAN-HCT) 160-12.5 MG tablet, Take 1 tablet by mouth daily., Disp: 90 tablet, Rfl: 1 .  vitamin C (ASCORBIC ACID) 500 MG tablet, Take 1 tablet by mouth daily., Disp: , Rfl:  .  zolpidem (AMBIEN CR) 12.5 MG CR tablet, Take 1 tablet (12.5 mg total) by mouth at bedtime., Disp: 90 tablet, Rfl: 0  No Known Allergies  I personally reviewed active problem list, medication list, allergies, family history, social  history, health maintenance with the patient/caregiver today.   ROS  Constitutional: Negative for fever or weight change.  Respiratory: Negative for cough and shortness of breath.   Cardiovascular: Negative for chest pain or palpitations.  Gastrointestinal: Negative for abdominal pain, no bowel changes.  Musculoskeletal: Negative for gait problem or joint swelling.  Skin: Negative for rash.  Neurological: Positive  for dizziness or headache.  No other specific complaints in a complete review of systems (except as listed in HPI above).  Objective  Vitals:   01/09/20 0940  BP: (!) 142/86  Pulse: 89  Resp: 16  Temp: 97.8 F (36.6 C)  TempSrc: Oral  SpO2: 100%  Weight: 176 lb 4.8 oz (80 kg)  Height: 5\' 4"  (1.626 m)    Body mass index is 30.26 kg/m.  Physical Exam  Constitutional: Patient appears well-developed and well-nourished. Obese No distress.  HEENT: head atraumatic, normocephalic, pupils equal and reactive to light, neck supple Cardiovascular: Normal rate, regular rhythm and normal heart sounds.  No murmur heard. No BLE edema. Pulmonary/Chest: Effort normal and breath sounds normal. No respiratory distress. Abdominal: Soft.  There is no  tenderness. Skin: senile purpura left arm  Psychiatric: Patient has a normal mood and affect. behavior is normal. Judgment and thought content normal.   PHQ2/9: Depression screen [facility] 2/9 01/09/2020 09/18/2019 09/05/2019 03/21/2019 01/24/2019  Decreased Interest 0 0 0 0 0  Down, Depressed, Hopeless 0 0 0 0 0  PHQ - 2 Score 0 0 0 0 0  Altered sleeping - 0 0 0 -  Tired, decreased energy - 0 0 0 -  Change in appetite - 0 0 0 -  Feeling bad or failure about yourself  - 0 0 0 -  Trouble concentrating - 0 0 0 -  Moving slowly or fidgety/restless - 0 0 0 -  Suicidal thoughts - 0 0 0 -  PHQ-9 Score - 0 0 0 -  Difficult doing work/chores - Not difficult at all Not difficult at all - -  Some recent data might be hidden    phq 9 is negative   Fall Risk: Fall Risk  01/09/2020 09/18/2019 09/05/2019 03/21/2019 01/24/2019  Falls in the past year? 0 0 0 0 0  Number falls in past yr: 0 0 0 0 0  Injury with Fall? 0 0 0 0 0  Risk for fall due to : - - - - -  Risk for fall due to: Comment - - - - -  Follow up - Falls evaluation completed Falls evaluation completed - Falls prevention discussed     Functional Status Survey: Is the patient deaf or have difficulty hearing?: No Does the patient have difficulty seeing, even when wearing glasses/contacts?: No Does the patient have difficulty concentrating, remembering, or making decisions?: No Does the patient have difficulty walking or climbing stairs?: No Does the patient have difficulty dressing or bathing?: No Does the patient have difficulty doing errands alone such as visiting a doctor's office or shopping?: No    Assessment & Plan  1. Hyperglycemia  - Hemoglobin A1c  2. Senile purpura ([place])  Reassurance   3. Benign essential HTN  - amLODipine (NORVASC) 10 MG tablet; Take 1 tablet (10 mg total) by mouth daily.  Dispense: 90 tablet; Refill: 1 - valsartan-hydrochlorothiazide (DIOVAN-HCT) 160-12.5 MG tablet; Take 1 tablet by mouth daily.   Dispense: 90 tablet; Refill: 1 - CBC with Differential/Platelet - COMPLETE METABOLIC PANEL WITH GFR  4. Chronic insomnia   5. Seasonal  allergic rhinitis, unspecified trigger  - montelukast (SINGULAIR) 10 MG tablet; Take 1 tablet (10 mg total) by mouth at bedtime.  Dispense: 90 tablet; Refill: 1  6. Primary osteoarthritis of both knees   7. Need for immunization against influenza  - Flu Vaccine QUAD High Dose(Fluad)  8. Chronic right-sided low back pain with right-sided sciatica   9. Hypercholesteremia  - Lipid panel  10. Changing sleep-work schedule, affecting sleep   11. Chronic frontal sinusitis   12. Wheezing  - montelukast (SINGULAIR) 10 MG tablet; Take 1 tablet (10 mg total) by mouth at bedtime.  Dispense: 90 tablet; Refill: 1  13. Urge incontinence of urine  - tolterodine (DETROL LA) 2 MG 24 hr capsule; Take 1 capsule (2 mg total) by mouth daily.  Dispense: 90 capsule; Refill: 1

## 2020-01-09 ENCOUNTER — Ambulatory Visit (INDEPENDENT_AMBULATORY_CARE_PROVIDER_SITE_OTHER): Payer: BC Managed Care – PPO | Admitting: Family Medicine

## 2020-01-09 ENCOUNTER — Other Ambulatory Visit: Payer: Self-pay

## 2020-01-09 ENCOUNTER — Encounter: Payer: Self-pay | Admitting: Family Medicine

## 2020-01-09 VITALS — BP 142/86 | HR 89 | Temp 97.8°F | Resp 16 | Ht 64.0 in | Wt 176.3 lb

## 2020-01-09 DIAGNOSIS — R739 Hyperglycemia, unspecified: Secondary | ICD-10-CM | POA: Diagnosis not present

## 2020-01-09 DIAGNOSIS — M170 Bilateral primary osteoarthritis of knee: Secondary | ICD-10-CM

## 2020-01-09 DIAGNOSIS — D692 Other nonthrombocytopenic purpura: Secondary | ICD-10-CM | POA: Diagnosis not present

## 2020-01-09 DIAGNOSIS — G4726 Circadian rhythm sleep disorder, shift work type: Secondary | ICD-10-CM

## 2020-01-09 DIAGNOSIS — R062 Wheezing: Secondary | ICD-10-CM

## 2020-01-09 DIAGNOSIS — M5441 Lumbago with sciatica, right side: Secondary | ICD-10-CM

## 2020-01-09 DIAGNOSIS — N3941 Urge incontinence: Secondary | ICD-10-CM

## 2020-01-09 DIAGNOSIS — G8929 Other chronic pain: Secondary | ICD-10-CM

## 2020-01-09 DIAGNOSIS — I10 Essential (primary) hypertension: Secondary | ICD-10-CM | POA: Diagnosis not present

## 2020-01-09 DIAGNOSIS — F5104 Psychophysiologic insomnia: Secondary | ICD-10-CM | POA: Diagnosis not present

## 2020-01-09 DIAGNOSIS — J321 Chronic frontal sinusitis: Secondary | ICD-10-CM

## 2020-01-09 DIAGNOSIS — Z23 Encounter for immunization: Secondary | ICD-10-CM | POA: Diagnosis not present

## 2020-01-09 DIAGNOSIS — J302 Other seasonal allergic rhinitis: Secondary | ICD-10-CM | POA: Diagnosis not present

## 2020-01-09 DIAGNOSIS — E7800 Pure hypercholesterolemia, unspecified: Secondary | ICD-10-CM

## 2020-01-09 MED ORDER — AMLODIPINE BESYLATE 10 MG PO TABS: 10.0000 mg | ORAL_TABLET | Freq: Every day | ORAL | 1 refills | Status: DC

## 2020-01-09 MED ORDER — MONTELUKAST SODIUM 10 MG PO TABS: 10.0000 mg | ORAL_TABLET | Freq: Every day | ORAL | 1 refills | Status: DC

## 2020-01-09 MED ORDER — VALSARTAN-HYDROCHLOROTHIAZIDE 160-12.5 MG PO TABS: 1.0000 | ORAL_TABLET | Freq: Every day | ORAL | 1 refills | Status: DC

## 2020-01-09 MED ORDER — TOLTERODINE TARTRATE ER 2 MG PO CP24: 2.0000 mg | ORAL_CAPSULE | Freq: Every day | ORAL | 1 refills | Status: DC

## 2020-01-10 LAB — COMPLETE METABOLIC PANEL WITH GFR
AG Ratio: 1.3 (calc) (ref 1.0–2.5)
ALT: 20 U/L (ref 6–29)
AST: 25 U/L (ref 10–35)
Albumin: 4.4 g/dL (ref 3.6–5.1)
Alkaline phosphatase (APISO): 68 U/L (ref 37–153)
BUN: 20 mg/dL (ref 7–25)
CO2: 31 mmol/L (ref 20–32)
Calcium: 10 mg/dL (ref 8.6–10.4)
Chloride: 101 mmol/L (ref 98–110)
Creat: 0.8 mg/dL (ref 0.60–0.93)
GFR, Est African American: 85 mL/min/{1.73_m2} (ref >=60)
GFR, Est Non African American: 74 mL/min/{1.73_m2} (ref >=60)
Globulin: 3.3 g/dL (calc) (ref 1.9–3.7)
Glucose, Bld: 80 mg/dL (ref 65–99)
Potassium: 3.8 mmol/L (ref 3.5–5.3)
Sodium: 139 mmol/L (ref 135–146)
Total Bilirubin: 0.4 mg/dL (ref 0.2–1.2)
Total Protein: 7.7 g/dL (ref 6.1–8.1)

## 2020-01-10 LAB — CBC WITH DIFFERENTIAL/PLATELET
Absolute Monocytes: 428 cells/uL (ref 200–950)
Basophils Absolute: 132 cells/uL (ref 0–200)
Basophils Relative: 2.1 %
Eosinophils Absolute: 384 cells/uL (ref 15–500)
Eosinophils Relative: 6.1 %
HCT: 39.1 % (ref 35.0–45.0)
Hemoglobin: 12.7 g/dL (ref 11.7–15.5)
Lymphs Abs: 2570 cells/uL (ref 850–3900)
MCH: 27.4 pg (ref 27.0–33.0)
MCHC: 32.5 g/dL (ref 32.0–36.0)
MCV: 84.3 fL (ref 80.0–100.0)
MPV: 10.5 fL (ref 7.5–12.5)
Monocytes Relative: 6.8 %
Neutro Abs: 2785 cells/uL (ref 1500–7800)
Neutrophils Relative %: 44.2 %
Platelets: 347 10*3/uL (ref 140–400)
RBC: 4.64 10*6/uL (ref 3.80–5.10)
RDW: 14 % (ref 11.0–15.0)
Total Lymphocyte: 40.8 %
WBC: 6.3 10*3/uL (ref 3.8–10.8)

## 2020-01-10 LAB — HEMOGLOBIN A1C
Hgb A1c MFr Bld: 5.9 % of total Hgb — ABNORMAL HIGH (ref <5.7)
Mean Plasma Glucose: 123 (calc)
eAG (mmol/L): 6.8 (calc)

## 2020-01-10 LAB — LIPID PANEL
Cholesterol: 225 mg/dL — ABNORMAL HIGH (ref <200)
HDL: 100 mg/dL (ref >=50)
LDL Cholesterol (Calc): 111 mg/dL (calc) — ABNORMAL HIGH
Non-HDL Cholesterol (Calc): 125 mg/dL (calc) (ref <130)
Total CHOL/HDL Ratio: 2.3 (calc) (ref <5.0)
Triglycerides: 48 mg/dL (ref <150)

## 2020-01-30 ENCOUNTER — Ambulatory Visit: Payer: BC Managed Care – PPO

## 2020-03-02 ENCOUNTER — Encounter: Payer: Self-pay | Admitting: Pulmonary Disease

## 2020-03-02 ENCOUNTER — Other Ambulatory Visit: Payer: Self-pay

## 2020-03-02 ENCOUNTER — Ambulatory Visit (INDEPENDENT_AMBULATORY_CARE_PROVIDER_SITE_OTHER): Payer: BC Managed Care – PPO | Admitting: Pulmonary Disease

## 2020-03-02 VITALS — BP 136/72 | HR 76 | Temp 97.5°F | Ht 62.0 in | Wt 174.0 lb

## 2020-03-02 DIAGNOSIS — J4541 Moderate persistent asthma with (acute) exacerbation: Secondary | ICD-10-CM

## 2020-03-02 DIAGNOSIS — R059 Cough, unspecified: Secondary | ICD-10-CM

## 2020-03-02 DIAGNOSIS — J324 Chronic pansinusitis: Secondary | ICD-10-CM | POA: Diagnosis not present

## 2020-03-02 MED ORDER — PREDNISONE 10 MG (21) PO TBPK: ORAL_TABLET | ORAL | 0 refills | Status: DC

## 2020-03-02 MED ORDER — BREO ELLIPTA 200-25 MCG/INH IN AEPB: 1.0000 | INHALATION_SPRAY | Freq: Every day | RESPIRATORY_TRACT | 0 refills | Status: AC

## 2020-03-02 MED ORDER — AZITHROMYCIN 250 MG PO TABS: ORAL_TABLET | ORAL | 0 refills | Status: AC

## 2020-03-02 NOTE — Progress Notes (Signed)
Subjective:    Patient ID: Hayley Howe, female    DOB: 1946-04-18, 74 y.o.   MRN: [id number]  HPI Patient is a 74 year old woman, remote former smoker, with severe pansinusitis followed by Dr. Tami Ribas, she also has distant asthma.  This is manifested by bronchospasm and cough.  Approximately a month ago she started having worsening of symptoms.  She has a very nasal quality to her speech.  She has chronic nasal discharge.  She stopped using Breo inexplicably 1 week ago.  Symptoms have worsened since then.  She has not had any fevers, chills or sweats. Cough has been dry as noted above.  Almost constant postnasal drip.  Review of Systems A 10 point review of systems was performed and it is as noted above otherwise negative.  Patient Active Problem List   Diagnosis Date Noted  . Hyperglycemia 07/13/2017  . Chronic constipation 12/15/2016  . Primary osteoarthritis of left hip 11/05/2015  . Scoliosis 11/05/2015  . Chronic low back pain 10/30/2014  . Benign essential HTN 10/27/2014  . Bradycardia 10/27/2014  . Insomnia 10/27/2014  . Headache, temporal 10/27/2014  . Hypercholesteremia 10/27/2014  . Obesity (BMI 30-39.9) 10/27/2014  . Changing sleep-work schedule, affecting sleep 10/27/2014  . Vitamin D deficiency 10/27/2014  . Arthritis of knee, degenerative 09/24/2009    No Known Allergies   Current Meds  Medication Sig  . amLODipine (NORVASC) 10 MG tablet Take 1 tablet (10 mg total) by mouth daily.  Marland Kitchen aspirin EC 81 MG tablet Take 1 tablet (81 mg total) by mouth daily.  . Cholecalciferol (VITAMIN D) 2000 UNITS tablet Take 1 tablet by mouth daily.  . fluticasone (FLONASE) 50 MCG/ACT nasal spray Use 2 spray(s) in each nostril once daily  . loratadine (CLARITIN) 10 MG tablet Take 1 tablet (10 mg total) by mouth daily.  . Magnesium 250 MG TABS Take 1 tablet by mouth daily.  . metaxalone (SKELAXIN) 800 MG tablet Take 1 tablet (800 mg total) by mouth 3 (three) times daily as needed.  for muscle spams  . montelukast (SINGULAIR) 10 MG tablet Take 1 tablet (10 mg total) by mouth at bedtime.  . pravastatin (PRAVACHOL) 40 MG tablet Take 1 tablet (40 mg total) by mouth daily.  Marland Kitchen tolterodine (DETROL LA) 2 MG 24 hr capsule Take 1 capsule (2 mg total) by mouth daily.  . traMADol (ULTRAM) 50 MG tablet Take 1 tablet (50 mg total) by mouth 2 (two) times daily as needed.  . valsartan-hydrochlorothiazide (DIOVAN-HCT) 160-12.5 MG tablet Take 1 tablet by mouth daily.  . vitamin C (ASCORBIC ACID) 500 MG tablet Take 1 tablet by mouth daily.  Marland Kitchen zolpidem (AMBIEN CR) 12.5 MG CR tablet Take 1 tablet (12.5 mg total) by mouth at bedtime.    Immunization History  Administered Date(s) Administered  . Fluad Quad(high Dose 65+) 12/13/2018, 01/09/2020  . Influenza, High Dose Seasonal PF 01/20/2016, 12/15/2016, 01/19/2018  . Influenza, Seasonal, Injecte, Preservative Fre 02/18/2010, 02/11/2011, 03/29/2012  . Influenza,inj,Quad PF,6+ Mos 01/03/2013, 01/10/2014, 03/05/2015  . Influenza-Unspecified 12/17/2013  . Moderna SARS-COVID-2 Vaccination 05/26/2019, 06/26/2019  . Pneumococcal Conjugate-13 06/19/2014  . Pneumococcal Polysaccharide-23 02/18/2010, 07/02/2015  . Td 08/12/2016  . Tdap 09/14/2007  . Zoster 08/20/2015       Objective:   Physical Exam BP 136/72 (BP Location: Left Arm, Cuff Size: Normal)   Pulse 76   Temp (!) 97.5 F (36.4 C) (Temporal)   Ht 5\' 2"  (1.575 m)   Wt 174 lb (78.9 kg)  SpO2 100%   BMI 31.83 kg/m  GENERAL: Well-developed, overweight woman in no acute distress, well groomed.  Fully ambulatory.  Intermittent coughing.  Nonproductive. HEAD: Normocephalic, atraumatic.  Nasal quality to speech. EYES: Pupils equal, round, reactive to light.  No scleral icterus.  MOUTH: Nose/mouth/throat not examined due to masking requirements for COVID 19. NECK: Supple. No thyromegaly. Trachea midline. No JVD.  No adenopathy. PULMONARY: Good air entry bilaterally.  End expiratory  wheezes, few rhonchi noted. CARDIOVASCULAR: S1 and S2. Regular rate and rhythm.  Grade 1/6 systolic ejection murmur left sternal border.  No gallops or rubs.   GASTROINTESTINAL: No distention MUSCULOSKELETAL: No joint deformity, no clubbing, no edema.  NEUROLOGIC: Awake, alert, no focal deficits.  No gait disturbance noted during ambulation. Speech is fluent. SKIN: Intact,warm,dry.  No overt rashes noted. PSYCH: Mood and behavior appropriate       Assessment & Plan:     ICD-10-CM   1. Moderate persistent asthma with acute exacerbation  J45.41 IgE    CBC w/Diff   Resume Breo Ellipta 200/25, 1 inhalation daily Consider switch to Trelegy on follow-up visit Prednisone taper and azithromycin Z-Pak  2. Chronic pansinusitis  J32.4    Managed by Dr. Tami Ribas Suspect she may need sinus surgery She has frequent exacerbations  3. Cough  R05.9    Multifactorial due to: Sinus disease with postnasal drip Poorly controlled asthma   Orders Placed This Encounter  Procedures  . IgE    Standing Status:   Future    Standing Expiration Date:   03/02/2021  . CBC w/Diff    Standing Status:   Future    Standing Expiration Date:   03/02/2021   Meds ordered this encounter  Medications  . predniSONE (STERAPRED UNI-PAK 21 TAB) 10 MG (21) TBPK tablet    Sig: Use as directed in the package    Dispense:  1 each    Refill:  0  . azithromycin (ZITHROMAX) 250 MG tablet    Sig: Take 2 tablets (500 mg) on  Day 1,  followed by 1 tablet (250 mg) once daily on Days 2 through 5.    Dispense:  6 each    Refill:  0  . fluticasone furoate-vilanterol (BREO ELLIPTA) 200-25 MCG/INH AEPB    Sig: Inhale 1 puff into the lungs daily for 1 day.    Dispense:  14 each    Refill:  0    Order Specific Question:   Lot Number?    Answer:   [id number]    Order Specific Question:   Expiration Date?    Answer:   10/16/2020    Order Specific Question:   Manufacturer?    Answer:   Gl[employer]SmithK[employer] [[id number]]    Order Specific Question:    Quantity    Answer:   1   Discussion:  Patient presents with an exacerbation of asthma due to exacerbation of her chronic sinusitis.  This is an issue that we will need further attention.  I recommend that she have Dr. Tami Ribas reevaluate her sinus disease.  She may need surgical intervention.  We will treat her with prednisone and azithromycin Z-Pak.  Resume Breo Ellipta.  Will consider on follow-up trial of Trelegy Ellipta if she is not responding.  Will recheck CBC with differential and IgE today.  We will see her in follow-up in 3 to 4 weeks time with either me or the nurse practitioner at that time.  She is to contact us prior to that time should  any new difficulties arise.  Renold Don, MD [facility]   *This note was dictated using voice recognition software/Dragon.  Despite best efforts to proofread, errors can occur which can change the meaning.  Any change was purely unintentional.

## 2020-03-02 NOTE — Patient Instructions (Signed)
We have sent prescription for prednisone and antibiotic to your pharmacy  Please continue using the Breo you're on the stronger dose of the Breo. On your follow-up appointment we'll consider switching the medication if needed  Continue nasal hygiene as per instructions from Dr. Tami Ribas   See him in follow-up in 3 to 4 weeks time with me or the practitioner

## 2020-03-02 NOTE — Progress Notes (Signed)
ge

## 2020-03-03 ENCOUNTER — Telehealth: Payer: Self-pay | Admitting: Family Medicine

## 2020-03-03 NOTE — Telephone Encounter (Signed)
Copied from [place] [id number]. Topic: Medicare AWV >> Mar 03, 2020 11:09 AM Cher Nakai R wrote: Reason for CRM:  Left message for patient to call back and schedule Medicare Annual Wellness Visit (AWV) in office.   If not able to come in office, please offer to do virtually.   Last AWV 01/24/2019  Please schedule at anytime with [place].  40 minute appointment  Any questions, please contact me at [phone]

## 2020-03-07 ENCOUNTER — Other Ambulatory Visit: Payer: Self-pay | Admitting: Family Medicine

## 2020-03-07 DIAGNOSIS — F5104 Psychophysiologic insomnia: Secondary | ICD-10-CM

## 2020-03-07 DIAGNOSIS — G4726 Circadian rhythm sleep disorder, shift work type: Secondary | ICD-10-CM

## 2020-03-07 NOTE — Telephone Encounter (Signed)
Requested medication (s) are due for refill today: no  Requested medication (s) are on the active medication list: yes  Last refill:  09/29/2019  Future visit scheduled: yes  Notes to clinic: this refill cannot be delegated    Requested Prescriptions  Pending Prescriptions Disp Refills   zolpidem (AMBIEN CR) 12.5 MG CR tablet [Pharmacy Med Name: Zolpidem Tartrate ER 12.5 MG Oral Tablet Extended Release] 90 tablet 0    Sig: TAKE 1 TABLET BY MOUTH AT BEDTIME      Not Delegated - Psychiatry:  Anxiolytics/Hypnotics Failed - 03/07/2020  2:34 PM      Failed - This refill cannot be delegated      Failed - Urine Drug Screen completed in last 360 days      Passed - Valid encounter within last 6 months    Recent Outpatient Visits           1 month ago Senile purpura [facility])   [facility] Steele Sizer, MD   5 months ago [facility] Steele Sizer, MD   6 months ago Senile purpura [facility])   [facility] Steele Sizer, MD   11 months ago Chronic cough   [facility] Steele Sizer, MD   1 year ago Chronic cough   [facility] Steele Sizer, MD       Future Appointments             In 1 month Warner Mccreedy, Vinson Moselle, NP [facility] [place]   In 2 months Steele Sizer, MD [facility]

## 2020-03-08 ENCOUNTER — Telehealth: Payer: Self-pay | Admitting: Family Medicine

## 2020-03-08 DIAGNOSIS — G8929 Other chronic pain: Secondary | ICD-10-CM

## 2020-03-08 DIAGNOSIS — M170 Bilateral primary osteoarthritis of knee: Secondary | ICD-10-CM

## 2020-03-08 NOTE — Telephone Encounter (Signed)
Requested medication (s) are due for refill today: Yes  Requested medication (s) are on the active medication list: Yes  Last refill:  09/05/19  Future visit scheduled: Yes  Notes to clinic:  See request.    Requested Prescriptions  Pending Prescriptions Disp Refills   traMADol (ULTRAM) 50 MG tablet [Pharmacy Med Name: traMADol HCl 50 MG Oral Tablet] 60 tablet 0    Sig: Take 1 tablet by mouth twice daily as needed      Not Delegated - Analgesics:  Opioid Agonists Failed - 03/08/2020 11:29 AM      Failed - This refill cannot be delegated      Failed - Urine Drug Screen completed in last 360 days      Passed - Valid encounter within last 6 months    Recent Outpatient Visits           1 month ago Senile purpura [facility])   [facility] Steele Sizer, MD   5 months ago [facility] Steele Sizer, MD   6 months ago Senile purpura [facility]   [facility] Steele Sizer, MD   11 months ago Chronic cough   [facility] Steele Sizer, MD   1 year ago Chronic cough   [facility] Steele Sizer, MD       Future Appointments             In 4 weeks Warner Mccreedy, Vinson Moselle, NP [facility] [place]   In 2 months Steele Sizer, MD [facility]

## 2020-03-09 ENCOUNTER — Other Ambulatory Visit: Payer: Self-pay

## 2020-03-09 ENCOUNTER — Other Ambulatory Visit: Payer: Self-pay | Admitting: Family Medicine

## 2020-03-09 DIAGNOSIS — G8929 Other chronic pain: Secondary | ICD-10-CM

## 2020-03-09 DIAGNOSIS — F5104 Psychophysiologic insomnia: Secondary | ICD-10-CM

## 2020-03-09 DIAGNOSIS — M170 Bilateral primary osteoarthritis of knee: Secondary | ICD-10-CM

## 2020-03-09 DIAGNOSIS — G4726 Circadian rhythm sleep disorder, shift work type: Secondary | ICD-10-CM

## 2020-03-09 MED ORDER — TRAMADOL HCL 50 MG PO TABS: 50.0000 mg | ORAL_TABLET | Freq: Two times a day (BID) | ORAL | 0 refills | Status: DC | PRN

## 2020-03-09 MED ORDER — ZOLPIDEM TARTRATE ER 12.5 MG PO TBCR: 12.5000 mg | EXTENDED_RELEASE_TABLET | Freq: Every day | ORAL | 0 refills | Status: DC

## 2020-03-09 NOTE — Telephone Encounter (Signed)
lvm for pt to check with pharmacy

## 2020-03-09 NOTE — Telephone Encounter (Signed)
Pharmacy called stating that they received this message from PCP; however, they state the prescription was expired and is needing to have a new prescription written. The pharmacy is also requesting to have a prescription for the pts Ambien as it is out of refills. Please advise.

## 2020-03-21 ENCOUNTER — Other Ambulatory Visit: Payer: Self-pay | Admitting: Family Medicine

## 2020-03-21 DIAGNOSIS — Z1231 Encounter for screening mammogram for malignant neoplasm of breast: Secondary | ICD-10-CM

## 2020-04-06 ENCOUNTER — Ambulatory Visit: Payer: BC Managed Care – PPO | Admitting: Pulmonary Disease

## 2020-04-21 ENCOUNTER — Ambulatory Visit: Payer: BC Managed Care – PPO

## 2020-04-21 ENCOUNTER — Ambulatory Visit (INDEPENDENT_AMBULATORY_CARE_PROVIDER_SITE_OTHER): Payer: BC Managed Care – PPO

## 2020-04-21 DIAGNOSIS — Z0000 Encounter for general adult medical examination without abnormal findings: Secondary | ICD-10-CM | POA: Diagnosis not present

## 2020-04-21 NOTE — Progress Notes (Signed)
Subjective:   Hayley Howe is a 75 y.o. female who presents for Medicare Annual (Subsequent) preventive examination.  Virtual Visit via Telephone Note  I connected with  Hayley Howe on 04/21/20 at  8:00 AM EST by telephone and verified that I am speaking with the correct person using two identifiers.  Location: Patient: home Provider: [place] Persons participating in the virtual visit: [place]   I discussed the limitations, risks, security and privacy concerns of performing an evaluation and management service by telephone and the availability of in person appointments. The patient expressed understanding and agreed to proceed.  Interactive audio and video telecommunications were attempted between this nurse and patient, however failed, due to patient having technical difficulties OR patient did not have access to video capability.  We continued and completed visit with audio only.  Some vital signs may be absent or patient reported.   Clemetine Marker, LPN    Review of Systems     Cardiac Risk Factors include: advanced age (>7men, >30 women);dyslipidemia;hypertension;obesity (BMI >30kg/m2)     Objective:    There were no vitals filed for this visit. There is no height or weight on file to calculate BMI.  Advanced Directives 04/21/2020 01/24/2019 01/19/2018 12/15/2016 08/12/2016 06/30/2016 06/02/2016  Does Patient Have a Medical Advance Directive? Yes Yes Yes No Yes Yes Yes  Type of Paramedic of [place];Living will [place];Living will [place];Living will - [place];Living will Living will;Healthcare Power of [place];Living will  Does patient want to make changes to medical advance directive? - - - - - - -  Copy of [place] in Chart? No - copy requested No - copy requested No - copy requested - No - copy requested No - copy  requested -    Current Medications (verified) Outpatient Encounter Medications as of 04/21/2020  Medication Sig  . amLODipine (NORVASC) 10 MG tablet Take 1 tablet (10 mg total) by mouth daily.  Marland Kitchen aspirin EC 81 MG tablet Take 1 tablet (81 mg total) by mouth daily.  . Azelastine-Fluticasone 137-50 MCG/ACT SUSP Place 1 spray into both nostrils 2 (two) times daily.  . Cholecalciferol (VITAMIN D) 2000 UNITS tablet Take 1 tablet by mouth daily.  . fluticasone furoate-vilanterol (BREO ELLIPTA) 200-25 MCG/INH AEPB Inhale 1 puff into the lungs daily.  Marland Kitchen loratadine (CLARITIN) 10 MG tablet Take 1 tablet (10 mg total) by mouth daily.  . Magnesium 250 MG TABS Take 1 tablet by mouth daily.  . metaxalone (SKELAXIN) 800 MG tablet Take 1 tablet (800 mg total) by mouth 3 (three) times daily as needed. for muscle spams  . montelukast (SINGULAIR) 10 MG tablet Take 1 tablet (10 mg total) by mouth at bedtime.  . Multiple Vitamin (MULTIVITAMIN ADULT PO) Take by mouth.  . pravastatin (PRAVACHOL) 40 MG tablet Take 1 tablet (40 mg total) by mouth daily.  Marland Kitchen tolterodine (DETROL LA) 2 MG 24 hr capsule Take 1 capsule (2 mg total) by mouth daily.  . traMADol (ULTRAM) 50 MG tablet Take 1 tablet (50 mg total) by mouth 2 (two) times daily as needed.  . valsartan-hydrochlorothiazide (DIOVAN-HCT) 160-12.5 MG tablet Take 1 tablet by mouth daily.  . vitamin C (ASCORBIC ACID) 500 MG tablet Take 1 tablet by mouth daily.  Marland Kitchen zolpidem (AMBIEN CR) 12.5 MG CR tablet Take 1 tablet (12.5 mg total) by mouth at bedtime.  . [DISCONTINUED] fluticasone (FLONASE) 50  MCG/ACT nasal spray Use 2 spray(s) in each nostril once daily  . [DISCONTINUED] predniSONE (STERAPRED UNI-PAK 21 TAB) 10 MG (21) TBPK tablet Use as directed in the package   No facility-administered encounter medications on file as of 04/21/2020.    Allergies (verified) Patient has no known allergies.   History: Past Medical History:  Diagnosis Date  . Hyperlipidemia   .  Hypertension   . Insomnia   . Low back pain   . Obesity   . Osteoarthritis of left knee    Past Surgical History:  Procedure Laterality Date  . ABDOMINAL HYSTERECTOMY    . BREAST BIOPSY Right 01/21/2013   stereo - benign  . COLONOSCOPY WITH PROPOFOL N/A 06/30/2016   Procedure: COLONOSCOPY WITH PROPOFOL;  Surgeon: Jonathon Bellows, MD;  Location: [facility] ENDOSCOPY;  Service: Endoscopy;  Laterality: N/A;  . KNEE SURGERY Left 2012   Dr. Rudene Christians  . OOPHORECTOMY    . TUBAL LIGATION    . VAGINAL PROLAPSE REPAIR     Family History  Problem Relation Age of Onset  . Breast cancer Sister 35       in year 2013  . Hyperlipidemia Mother   . Diabetes Mother   . Cancer Father        Leukemia  . Hyperlipidemia Father   . Lung cancer Brother   . Breast cancer Sister   . Heart failure Brother    Social History   Socioeconomic History  . Marital status: Widowed    Spouse name: Not on file  . Number of children: 2  . Years of education: Not on file  . Highest education level: 12th grade  Occupational History  . 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 Smoking status: Former Smoker    Packs/day: 0.25    Years: 1.00    Pack years: 0.25    Types: Cigarettes    Start date: 08/13/1967    Quit date: 04/18/1977    Years since quitting: 43.0  . Smokeless tobacco: Never Used  . Tobacco comment: smoking cessation materials not required  Vaping Use  . Vaping Use: Never used  Substance and Sexual Activity  . Alcohol use: No    Alcohol/week: 0.0 standard drinks  . Drug use: No  . Sexual activity: Not Currently  Other Topics Concern  . Not on file  Social History Narrative   Daughter lives with her and grandson   Still working full time at [employer] 3rd shift      Mother of Honor Adriance   Social Determinants of Health   Financial Resource Strain: Low Risk   . Difficulty of Paying Living Expenses: Not hard at all  Food Insecurity: No Food Insecurity  .  Worried About Charity fundraiser in the Last Year: Never true  . Ran Out of Food in the Last Year: Never true  Transportation Needs: No Transportation Needs  . Lack of Transportation (Medical): No  . Lack of Transportation (Non-Medical): No  Physical Activity: Inactive  . Days of Exercise per Week: 0 days  . Minutes of Exercise per Session: 0 min  Stress: No Stress Concern Present  . Feeling of Stress : Only a little  Social Connections: Moderately Integrated  . Frequency of Communication with Friends and Family: More than three times a week  . Frequency of Social Gatherings with Friends and Family: Twice a week  . Attends Religious Services: More than 4  times per year  . Active Member of Clubs or Organizations: Yes  . Attends Archivist Meetings: More than 4 times per year  . Marital Status: Widowed    Tobacco Counseling Counseling given: Not Answered Comment: smoking cessation materials not required   Clinical Intake:  Pre-visit preparation completed: Yes  Pain : No/denies pain     Nutritional Risks: None Diabetes: No  How often do you need to have someone help you when you read instructions, pamphlets, or other written materials from your doctor or pharmacy?: 1 - Never    Interpreter Needed?: No  Information entered by :: Clemetine Marker LPN   Activities of Daily Living In your present state of health, do you have any difficulty performing the following activities: 04/21/2020 01/09/2020  Hearing? N N  Comment declines hearing aids -  Vision? N N  Difficulty concentrating or making decisions? N N  Walking or climbing stairs? N N  Dressing or bathing? N N  Doing errands, shopping? N N  Preparing Food and eating ? N -  Using the Toilet? N -  In the past six months, have you accidently leaked urine? N -  Do you have problems with loss of bowel control? N -  Managing your Medications? N -  Managing your Finances? N -  Housekeeping or managing your  Housekeeping? N -  Some recent data might be hidden    Patient Care Team: Steele Sizer, MD as PCP - General (Family Medicine) Grant Fontana, [place] as Consulting Physician (Chiropractic Medicine) Beverly Gust, MD as Consulting Physician (Otolaryngology)  Indicate any recent Medical Services you may have received from other than [facility] providers in the past year (date may be approximate).     Assessment:   This is a routine wellness examination for Hayley Howe.  Hearing/Vision screen  Hearing Screening   125Hz  250Hz  500Hz  1000Hz  2000Hz  3000Hz  4000Hz  6000Hz  8000Hz   Right ear:           Left ear:           Comments: Pt denies hearing difficulty  Vision Screening Comments: Vision screenings done at [facility] in [place], past due for exam  Dietary issues and exercise activities discussed: Current Exercise Habits: The patient has a physically strenuous job, but has no regular exercise apart from work., Exercise limited by: None identified  Goals    . DIET - INCREASE WATER INTAKE     Recommend drinking 6-8 glasses of water per day     . DIET - REDUCE FAT INTAKE     Recommend to eat 3 small healthy meals and at least 2 healthy snacks per day.       Depression Screen PHQ 2/9 Scores 04/21/2020 01/09/2020 09/18/2019 09/05/2019 03/21/2019 01/24/2019 12/13/2018  PHQ - 2 Score 0 0 0 0 0 0 0  PHQ- 9 Score - - 0 0 0 - 0    Fall Risk Fall Risk  04/21/2020 01/09/2020 09/18/2019 09/05/2019 03/21/2019  Falls in the past year? 0 0 0 0 0  Number falls in past yr: 0 0 0 0 0  Injury with Fall? 0 0 0 0 0  Risk for fall due to : No Fall Risks - - - -  Risk for fall due to: Comment - - - - -  Follow up Falls prevention discussed - Falls evaluation completed Falls evaluation completed -    FALL RISK PREVENTION PERTAINING TO THE HOME:  Any stairs in or around the home? Yes  If  so, are there any without handrails? No  Home free of loose throw rugs in walkways, pet beds, electrical cords, etc? Yes   Adequate lighting in your home to reduce risk of falls? Yes   ASSISTIVE DEVICES UTILIZED TO PREVENT FALLS:  Life alert? No  Use of a cane, walker or w/c? No  Grab bars in the bathroom? No  Shower chair or bench in shower? No  Elevated toilet seat or a handicapped toilet? No   TIMED UP AND GO:  Was the test performed? No . Telephonic visit.  Cognitive Function: Normal cognitive status assessed by direct observation by this Nurse Health Advisor. No abnormalities found.       6CIT Screen 01/24/2019 01/19/2018  What Year? 0 points 0 points  What month? 0 points 0 points  What time? 0 points 0 points  Count back from 20 0 points 0 points  Months in reverse 0 points 2 points  Repeat phrase 0 points 4 points  Total Score 0 6    Immunizations Immunization History  Administered Date(s) Administered  . Fluad Quad(high Dose 65+) 12/13/2018, 01/09/2020  . Influenza, High Dose Seasonal PF 01/20/2016, 12/15/2016, 01/19/2018  . Influenza, Seasonal, Injecte, Preservative Fre 02/18/2010, 02/11/2011, 03/29/2012  . Influenza,inj,Quad PF,6+ Mos 01/03/2013, 01/10/2014, 03/05/2015  . Influenza-Unspecified 12/17/2013  . Moderna Sars-Covid-2 Vaccination 05/26/2019, 06/26/2019, 02/20/2020  . Pneumococcal Conjugate-13 06/19/2014  . Pneumococcal Polysaccharide-23 02/18/2010, 07/02/2015  . Td 08/12/2016  . Tdap 09/14/2007  . Zoster 08/20/2015    TDAP status: Up to date  Flu Vaccine status: Up to date  Pneumococcal vaccine status: Up to date  Covid-19 vaccine status: Completed vaccines  Qualifies for Shingles Vaccine? Yes   Zostavax completed Yes   Shingrix Completed?: No.    Education has been provided regarding the importance of this vaccine. Patient has been advised to call insurance company to determine out of pocket expense if they have not yet received this vaccine. Advised may also receive vaccine at local pharmacy or Health Dept. Verbalized acceptance and understanding.  Screening  Tests Health Maintenance  Topic Date Due  . MAMMOGRAM  03/23/2019  . COLONOSCOPY (Pts 45-34yrs Insurance coverage will need to be confirmed)  06/30/2021  . TETANUS/TDAP  08/13/2026  . INFLUENZA VACCINE  Completed  . DEXA SCAN  Completed  . COVID-19 Vaccine  Completed  . Hepatitis C Screening  Completed  . PNA vac Low Risk Adult  Completed    Health Maintenance  Health Maintenance Due  Topic Date Due  . MAMMOGRAM  03/23/2019    Colorectal cancer screening: Type of screening: Colonoscopy. Completed 06/30/16. Repeat every 5 years  Mammogram status: Completed 03/22/18. Repeat every year. Ordered 03/21/20.  Bone Density status: Completed 03/22/18. Results reflect: Bone density results: NORMAL. Repeat every 2 years. Pt requests to postpone repeat screening at this time.   Lung Cancer Screening: (Low Dose CT Chest recommended if Age 75-80 years, 30 pack-year currently smoking OR have quit w/in 15years.) does not qualify.   Additional Screening:  Hepatitis C Screening: does qualify; Completed 03/29/12  Vision Screening: Recommended annual ophthalmology exams for early detection of glaucoma and other disorders of the eye. Is the patient up to date with their annual eye exam?  No  Who is the provider or what is the name of the office in which the patient attends annual eye exams? [facility]  Dental Screening: Recommended annual dental exams for proper oral hygiene  Community Resource Referral / Chronic Care Management: CRR required  this visit?  No   CCM required this visit?  No      Plan:     I have personally reviewed and noted the following in the patient's chart:   . Medical and social history . Use of alcohol, tobacco or illicit drugs  . Current medications and supplements . Functional ability and status . Nutritional status . Physical activity . Advanced directives . List of other physicians . Hospitalizations, surgeries, and ER visits in previous  12 months . Vitals . Screenings to include cognitive, depression, and falls . Referrals and appointments  In addition, I have reviewed and discussed with patient certain preventive protocols, quality metrics, and best practice recommendations. A written personalized care plan for preventive services as well as general preventive health recommendations were provided to patient.     Clemetine Marker, LPN   [id number]   Nurse Notes: pt doing well and appreciative of visit today

## 2020-04-21 NOTE — Patient Instructions (Signed)
Hayley Howe , Thank you for taking time to come for your Medicare Wellness Visit. I appreciate your ongoing commitment to your health goals. Please review the following plan we discussed and let me know if I can assist you in the future.   Screening recommendations/referrals: Colonoscopy: done 06/30/16. Repeat in 2023 Mammogram: done 03/22/18. Please call [phone] to schedule your mammogram.  Bone Density: done 03/22/18 Recommended yearly ophthalmology/optometry visit for glaucoma screening and checkup Recommended yearly dental visit for hygiene and checkup  Vaccinations: Influenza vaccine: done 01/09/20 Pneumococcal vaccine: done 07/02/15 Tdap vaccine: done 08/12/16 Shingles vaccine: Shingrix discussed. Please contact your pharmacy for coverage information.  Covid-19: done 05/26/19, 06/26/19 & 02/20/20  Advanced directives: Please bring a copy of your health care power of attorney and living will to the office at your convenience.  Conditions/risks identified: Recommend increasing physical activity to 3 days per week  Next appointment: Follow up in one year for your annual wellness visit    Preventive Care 65 Years and Older, Female Preventive care refers to lifestyle choices and visits with your health care provider that can promote health and wellness. What does preventive care include?  A yearly physical exam. This is also called an annual well check.  Dental exams once or twice a year.  Routine eye exams. Ask your health care provider how often you should have your eyes checked.  Personal lifestyle choices, including:  Daily care of your teeth and gums.  Regular physical activity.  Eating a healthy diet.  Avoiding tobacco and drug use.  Limiting alcohol use.  Practicing safe sex.  Taking low-dose aspirin every day.  Taking vitamin and mineral supplements as recommended by your health care provider. What happens during an annual well check? The services and screenings  done by your health care provider during your annual well check will depend on your age, overall health, lifestyle risk factors, and family history of disease. Counseling  Your health care provider may ask you questions about your:  Alcohol use.  Tobacco use.  Drug use.  Emotional well-being.  Home and relationship well-being.  Sexual activity.  Eating habits.  History of falls.  Memory and ability to understand (cognition).  Work and work Astronomer.  Reproductive health. Screening  You may have the following tests or measurements:  Height, weight, and BMI.  Blood pressure.  Lipid and cholesterol levels. These may be checked every 5 years, or more frequently if you are over 24 years old.  Skin check.  Lung cancer screening. You may have this screening every year starting at age 73 if you have a 30-pack-year history of smoking and currently smoke or have quit within the past 15 years.  Fecal occult blood test (FOBT) of the stool. You may have this test every year starting at age 73.  Flexible sigmoidoscopy or colonoscopy. You may have a sigmoidoscopy every 5 years or a colonoscopy every 10 years starting at age 73.  Hepatitis C blood test.  Hepatitis B blood test.  Sexually transmitted disease (STD) testing.  Diabetes screening. This is done by checking your blood sugar (glucose) after you have not eaten for a while (fasting). You may have this done every 1-3 years.  Bone density scan. This is done to screen for osteoporosis. You may have this done starting at age 73.  Mammogram. This may be done every 1-2 years. Talk to your health care provider about how often you should have regular mammograms. Talk with your health care provider about your  test results, treatment options, and if necessary, the need for more tests. Vaccines  Your health care provider may recommend certain vaccines, such as:  Influenza vaccine. This is recommended every year.  Tetanus,  diphtheria, and acellular pertussis (Tdap, Td) vaccine. You may need a Td booster every 10 years.  Zoster vaccine. You may need this after age 73.  Pneumococcal 13-valent conjugate (PCV13) vaccine. One dose is recommended after age 73.  Pneumococcal polysaccharide (PPSV23) vaccine. One dose is recommended after age 18. Talk to your health care provider about which screenings and vaccines you need and how often you need them. This information is not intended to replace advice given to you by your health care provider. Make sure you discuss any questions you have with your health care provider. Document Released: 05/01/2015 Document Revised: 12/23/2015 Document Reviewed: 02/03/2015 Elsevier Interactive Patient Education  2017 [place] Prevention in the Home Falls can cause injuries. They can happen to people of all ages. There are many things you can do to make your home safe and to help prevent falls. What can I do on the outside of my home?  Regularly fix the edges of walkways and driveways and fix any cracks.  Remove anything that might make you trip as you walk through a door, such as a raised step or threshold.  Trim any bushes or trees on the path to your home.  Use bright outdoor lighting.  Clear any walking paths of anything that might make someone trip, such as rocks or tools.  Regularly check to see if handrails are loose or broken. Make sure that both sides of any steps have handrails.  Any raised decks and porches should have guardrails on the edges.  Have any leaves, snow, or ice cleared regularly.  Use sand or salt on walking paths during winter.  Clean up any spills in your garage right away. This includes oil or grease spills. What can I do in the bathroom?  Use night lights.  Install grab bars by the toilet and in the tub and shower. Do not use towel bars as grab bars.  Use non-skid mats or decals in the tub or shower.  If you need to sit down in  the shower, use a plastic, non-slip stool.  Keep the floor dry. Clean up any water that spills on the floor as soon as it happens.  Remove soap buildup in the tub or shower regularly.  Attach bath mats securely with double-sided non-slip rug tape.  Do not have throw rugs and other things on the floor that can make you trip. What can I do in the bedroom?  Use night lights.  Make sure that you have a light by your bed that is easy to reach.  Do not use any sheets or blankets that are too big for your bed. They should not hang down onto the floor.  Have a firm chair that has side arms. You can use this for support while you get dressed.  Do not have throw rugs and other things on the floor that can make you trip. What can I do in the kitchen?  Clean up any spills right away.  Avoid walking on wet floors.  Keep items that you use a lot in easy-to-reach places.  If you need to reach something above you, use a strong step stool that has a grab bar.  Keep electrical cords out of the way.  Do not use floor polish or wax that  makes floors slippery. If you must use wax, use non-skid floor wax.  Do not have throw rugs and other things on the floor that can make you trip. What can I do with my stairs?  Do not leave any items on the stairs.  Make sure that there are handrails on both sides of the stairs and use them. Fix handrails that are broken or loose. Make sure that handrails are as long as the stairways.  Check any carpeting to make sure that it is firmly attached to the stairs. Fix any carpet that is loose or worn.  Avoid having throw rugs at the top or bottom of the stairs. If you do have throw rugs, attach them to the floor with carpet tape.  Make sure that you have a light switch at the top of the stairs and the bottom of the stairs. If you do not have them, ask someone to add them for you. What else can I do to help prevent falls?  Wear shoes that:  Do not have high  heels.  Have rubber bottoms.  Are comfortable and fit you well.  Are closed at the toe. Do not wear sandals.  If you use a stepladder:  Make sure that it is fully opened. Do not climb a closed stepladder.  Make sure that both sides of the stepladder are locked into place.  Ask someone to hold it for you, if possible.  Clearly mark and make sure that you can see:  Any grab bars or handrails.  First and last steps.  Where the edge of each step is.  Use tools that help you move around (mobility aids) if they are needed. These include:  Canes.  Walkers.  Scooters.  Crutches.  Turn on the lights when you go into a dark area. Replace any light bulbs as soon as they burn out.  Set up your furniture so you have a clear path. Avoid moving your furniture around.  If any of your floors are uneven, fix them.  If there are any pets around you, be aware of where they are.  Review your medicines with your doctor. Some medicines can make you feel dizzy. This can increase your chance of falling. Ask your doctor what other things that you can do to help prevent falls. This information is not intended to replace advice given to you by your health care provider. Make sure you discuss any questions you have with your health care provider. Document Released: 01/29/2009 Document Revised: 09/10/2015 Document Reviewed: 05/09/2014 Elsevier Interactive Patient Education  2017 [employer].

## 2020-05-14 ENCOUNTER — Ambulatory Visit (INDEPENDENT_AMBULATORY_CARE_PROVIDER_SITE_OTHER): Payer: BC Managed Care – PPO | Admitting: Family Medicine

## 2020-05-14 ENCOUNTER — Encounter: Payer: Self-pay | Admitting: Family Medicine

## 2020-05-14 ENCOUNTER — Other Ambulatory Visit: Payer: Self-pay

## 2020-05-14 VITALS — BP 138/72 | HR 89 | Temp 98.1°F | Resp 16 | Ht 62.0 in | Wt 176.6 lb

## 2020-05-14 DIAGNOSIS — Z79891 Long term (current) use of opiate analgesic: Secondary | ICD-10-CM

## 2020-05-14 DIAGNOSIS — J4540 Moderate persistent asthma, uncomplicated: Secondary | ICD-10-CM | POA: Insufficient documentation

## 2020-05-14 DIAGNOSIS — I10 Essential (primary) hypertension: Secondary | ICD-10-CM | POA: Diagnosis not present

## 2020-05-14 DIAGNOSIS — M170 Bilateral primary osteoarthritis of knee: Secondary | ICD-10-CM

## 2020-05-14 DIAGNOSIS — F5104 Psychophysiologic insomnia: Secondary | ICD-10-CM

## 2020-05-14 DIAGNOSIS — E7800 Pure hypercholesterolemia, unspecified: Secondary | ICD-10-CM | POA: Diagnosis not present

## 2020-05-14 DIAGNOSIS — G8929 Other chronic pain: Secondary | ICD-10-CM

## 2020-05-14 DIAGNOSIS — D692 Other nonthrombocytopenic purpura: Secondary | ICD-10-CM | POA: Diagnosis not present

## 2020-05-14 DIAGNOSIS — R7303 Prediabetes: Secondary | ICD-10-CM

## 2020-05-14 DIAGNOSIS — M5441 Lumbago with sciatica, right side: Secondary | ICD-10-CM

## 2020-05-14 MED ORDER — VALSARTAN-HYDROCHLOROTHIAZIDE 160-12.5 MG PO TABS
1.0000 | ORAL_TABLET | Freq: Every day | ORAL | 1 refills | Status: DC
Start: 2020-05-14 — End: 2020-09-17

## 2020-05-14 MED ORDER — AMLODIPINE BESYLATE 10 MG PO TABS
10.0000 mg | ORAL_TABLET | Freq: Every day | ORAL | 1 refills | Status: DC
Start: 2020-05-14 — End: 2020-09-17

## 2020-05-14 MED ORDER — ROSUVASTATIN CALCIUM 10 MG PO TABS: 10.0000 mg | ORAL_TABLET | Freq: Every day | ORAL | 1 refills | Status: DC

## 2020-05-14 MED ORDER — TRAMADOL HCL 50 MG PO TABS: 50.0000 mg | ORAL_TABLET | Freq: Two times a day (BID) | ORAL | 2 refills | Status: DC | PRN

## 2020-05-14 NOTE — Progress Notes (Signed)
Name: Hayley Howe   MRN: [id number]    DOB: [birth year]   Date:05/14/2020       Progress Note  Subjective  Chief Complaint  Chief Complaint  Patient presents with  . Follow-up    HPI  Urge incontinence: it is intermittent and takes Detrol prn only, not lately. She is not sure why she is doing better.   Chronic sinusitis: she was seen by Dr. Duwayne Heck - pulmonologist , had a normal spirometry, CT chest  showed some scarring, CT sinus showed chronic sinusitis back in 2020 and was treated but continued to have the cough, and also has post-nasal drainage. She is using nasal spray and Dr. Duwayne Heck is now treating her for asthma with [facility], she states it has helped with SOB, wheezing, but continues to clear her throat and had a dry cough daily but not as constant or as severe.   Chronic Low Back: she has a long history of low back pain, not secondary to trauma, DDD lumbar spineand also scoliosis.taking Tramadol daily to control symptoms(takes 1 daily, takes 2nd tablets a few times a week), she is also on Skelaxinprn unable to tolerate Flexeril - it makes her sleepy. Pain is constant -pain right now is 3/10 because he was off work yesterday, average pain 5-6/10 but can go up to 9/10 at the end of work week. Occasionally the pain radiates to right lower leg,and when radiating it is described as numbnessthat is seldom maybe a couple of times a month. She goes to chiropractor( Dr. Genoveva Ill a month. 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 She sleepsfrom around2pm-8:30pmfor about 5-7hours when she sleeps during the dayTaking Ambien, denies side effects of medication, she is aware of FDA guidelines however unable to sleep with lower dose of Ambien.She only takes medications the nights that she works.   OA: she has aching pain on left knee,  s/pleftknee replaced, only triggered by kneeling down, surgery was done by Dr. Rudene Christians in 2012, doing well lately   Hyperglycemia: last hgbA1C down to 5.9 %, denies polyphagia, polydipsia or polyuria. Trying to eat healthy.   HTN: taking valsartan hctz, and also Norvasc, she states no longer having dizziness, no chest pain or palpitation   Dyslipidemia: on pravastatin, last LDL went up from 102 to 111, she is compliant with medication, discussed changing to get it closer to 100 and she agrees   Grieving: sister got sick back in 2022/05/13, and died 2019-11-09 from cancer, they were best friends and she has been struggling, discussed hospice counseling . She was going to retire last year but decided to work to keep herself busy, she has one sister that lives in [place] but they are not as close.   Senile Purpura: stable , recurrent on arms, reassurance given   Patient Active Problem List   Diagnosis Date Noted  . Hyperglycemia 07/13/2017  . Chronic constipation 12/15/2016  . Primary osteoarthritis of left hip 11/05/2015  . Scoliosis 11/05/2015  . Chronic low back pain 10/30/2014  . Benign essential HTN 10/27/2014  . Bradycardia 10/27/2014  . Insomnia 10/27/2014  . Headache, temporal 10/27/2014  . Hypercholesteremia 10/27/2014  . Obesity (BMI 30-39.9) 10/27/2014  . Changing sleep-work schedule, affecting sleep 10/27/2014  . Vitamin D deficiency 10/27/2014  . Arthritis of knee, degenerative 09/24/2009    Past Surgical History:  Procedure Laterality Date  .  ABDOMINAL HYSTERECTOMY    . BREAST BIOPSY Right 01/21/2013   stereo - benign  . COLONOSCOPY WITH PROPOFOL N/A 06/30/2016   Procedure: COLONOSCOPY WITH PROPOFOL;  Surgeon: Jonathon Bellows, MD;  Location: [facility] ENDOSCOPY;  Service: Endoscopy;  Laterality: N/A;  . KNEE SURGERY Left 2012   Dr. Rudene Christians  . OOPHORECTOMY    . TUBAL LIGATION    . VAGINAL PROLAPSE REPAIR      Family History  Problem Relation Age of Onset  . Breast cancer  Sister 29       in year 2013  . Hyperlipidemia Mother   . Diabetes Mother   . Cancer Father        Leukemia  . Hyperlipidemia Father   . Lung cancer Brother   . Breast cancer Sister   . Heart failure Brother     Social History   Tobacco Use  . Smoking status: Former Smoker    Packs/day: 0.25    Years: 1.00    Pack years: 0.25    Types: Cigarettes    Start date: 08/13/1967    Quit date: 04/18/1977    Years since quitting: 43.1  . Smokeless tobacco: Never Used  . Tobacco comment: smoking cessation materials not required  Substance Use Topics  . Alcohol use: No    Alcohol/week: 0.0 standard drinks     Current Outpatient Medications:  .  amLODipine (NORVASC) 10 MG tablet, Take 1 tablet (10 mg total) by mouth daily., Disp: 90 tablet, Rfl: 1 .  aspirin EC 81 MG tablet, Take 1 tablet (81 mg total) by mouth daily., Disp: 30 tablet, Rfl: 0 .  Azelastine-Fluticasone 137-50 MCG/ACT SUSP, Place 1 spray into both nostrils 2 (two) times daily., Disp: , Rfl:  .  Cholecalciferol (VITAMIN D) 2000 UNITS tablet, Take 1 tablet by mouth daily., Disp: , Rfl:  .  fluticasone furoate-vilanterol (BREO ELLIPTA) 200-25 MCG/INH AEPB, Inhale 1 puff into the lungs daily., Disp: 28 each, Rfl: 11 .  loratadine (CLARITIN) 10 MG tablet, Take 1 tablet (10 mg total) by mouth daily., Disp: 30 tablet, Rfl: 0 .  Magnesium 250 MG TABS, Take 1 tablet by mouth daily., Disp: , Rfl:  .  metaxalone (SKELAXIN) 800 MG tablet, Take 1 tablet (800 mg total) by mouth 3 (three) times daily as needed. for muscle spams, Disp: 90 tablet, Rfl: 0 .  montelukast (SINGULAIR) 10 MG tablet, Take 1 tablet (10 mg total) by mouth at bedtime., Disp: 90 tablet, Rfl: 1 .  Multiple Vitamin (MULTIVITAMIN ADULT PO), Take by mouth., Disp: , Rfl:  .  pravastatin (PRAVACHOL) 40 MG tablet, Take 1 tablet (40 mg total) by mouth daily., Disp: 90 tablet, Rfl: 1 .  tolterodine (DETROL LA) 2 MG 24 hr capsule, Take 1 capsule (2 mg total) by mouth daily.,  Disp: 90 capsule, Rfl: 1 .  traMADol (ULTRAM) 50 MG tablet, Take 1 tablet (50 mg total) by mouth 2 (two) times daily as needed., Disp: 60 tablet, Rfl: 0 .  valsartan-hydrochlorothiazide (DIOVAN-HCT) 160-12.5 MG tablet, Take 1 tablet by mouth daily., Disp: 90 tablet, Rfl: 1 .  vitamin C (ASCORBIC ACID) 500 MG tablet, Take 1 tablet by mouth daily., Disp: , Rfl:  .  zolpidem (AMBIEN CR) 12.5 MG CR tablet, Take 1 tablet (12.5 mg total) by mouth at bedtime., Disp: 90 tablet, Rfl: 0  No Known Allergies  I personally reviewed active problem list, medication list, allergies, family history, social history, health maintenance with the patient/caregiver today.   ROS  Constitutional: Negative for fever or weight change.  Respiratory: positive  For chronic  cough and shortness of breath.   Cardiovascular: Negative for chest pain or palpitations.  Gastrointestinal: Negative for abdominal pain, no bowel changes.  Musculoskeletal: Negative for gait problem or joint swelling.  Skin: Negative for rash.  Neurological: Negative for dizziness or headache.  No other specific complaints in a complete review of systems (except as listed in HPI above).  Objective  Vitals:   05/14/20 0827  BP: 138/72  Pulse: 89  Resp: 16  Temp: 98.1 F (36.7 C)  TempSrc: Oral  SpO2: 99%  Weight: 176 lb 9.6 oz (80.1 kg)  Height: 5\' 2"  (1.575 m)    Body mass index is 32.3 kg/m.  Physical Exam  Constitutional: Patient appears well-developed and well-nourished. Obese No distress.  HEENT: head atraumatic, normocephalic, pupils equal and reactive to light, neck supple Cardiovascular: Normal rate, regular rhythm and normal heart sounds.  No murmur heard. No BLE edema. Pulmonary/Chest: Effort normal and breath sounds normal. No respiratory distress. Abdominal: Soft.  There is no tenderness. Psychiatric: Patient has a normal mood and affect. behavior is normal. Judgment and thought content  normal.    PHQ2/9: Depression screen [facility] 2/9 05/14/2020 04/21/2020 01/09/2020 09/18/2019 09/05/2019  Decreased Interest 0 0 0 0 0  Down, Depressed, Hopeless 0 0 0 0 0  PHQ - 2 Score 0 0 0 0 0  Altered sleeping - - - 0 0  Tired, decreased energy - - - 0 0  Change in appetite - - - 0 0  Feeling bad or failure about yourself  - - - 0 0  Trouble concentrating - - - 0 0  Moving slowly or fidgety/restless - - - 0 0  Suicidal thoughts - - - 0 0  PHQ-9 Score - - - 0 0  Difficult doing work/chores - - - Not difficult at all Not difficult at all  Some recent data might be hidden    phq 9 is negative   Fall Risk: Fall Risk  05/14/2020 04/21/2020 01/09/2020 09/18/2019 09/05/2019  Falls in the past year? 0 0 0 0 0  Number falls in past yr: 0 0 0 0 0  Injury with Fall? 0 0 0 0 0  Risk for fall due to : - No Fall Risks - - -  Risk for fall due to: Comment - - - - -  Follow up - Falls prevention discussed - Falls evaluation completed Falls evaluation completed    Functional Status Survey: Is the patient deaf or have difficulty hearing?: No Does the patient have difficulty seeing, even when wearing glasses/contacts?: No Does the patient have difficulty concentrating, remembering, or making decisions?: No Does the patient have difficulty walking or climbing stairs?: No Does the patient have difficulty dressing or bathing?: No Does the patient have difficulty doing errands alone such as visiting a doctor's office or shopping?: No    Assessment & Plan  1. Senile purpura (HCC)  Stable   2. Primary osteoarthritis of both knees  - traMADol (ULTRAM) 50 MG tablet; Take 1 tablet (50 mg total) by mouth 2 (two) times daily as needed.  Dispense: 60 tablet; Refill: 2  3. Benign essential HTN  - valsartan-hydrochlorothiazide (DIOVAN-HCT) 160-12.5 MG tablet; Take 1 tablet by mouth daily.  Dispense: 90 tablet; Refill: 1 - amLODipine (NORVASC) 10 MG tablet; Take 1 tablet (10 mg total) by mouth daily.   Dispense: 90 tablet; Refill: 1  4. Hypercholesteremia  -  rosuvastatin (CRESTOR) 10 MG tablet; Take 1 tablet (10 mg total) by mouth daily.  Dispense: 90 tablet; Refill: 1  5. Chronic insomnia  She will call for refills   6. Pre-diabetes   7. Moderate persistent asthma without complication  Under the care of Dr. Duwayne Heck  8. Chronic right-sided low back pain with right-sided sciatica  - traMADol (ULTRAM) 50 MG tablet; Take 1 tablet (50 mg total) by mouth 2 (two) times daily as needed.  Dispense: 60 tablet; Refill: 2  9. Opioid contract exists  - Drug Monitor,Opiates,Screen,Urine

## 2020-05-15 LAB — DM TEMPLATE

## 2020-05-15 LAB — DRUG MONITOR,OPIATES,SCREEN,URINE: Opiates: NEGATIVE ng/mL (ref <100)

## 2020-06-01 ENCOUNTER — Telehealth: Payer: Self-pay | Admitting: Pulmonary Disease

## 2020-06-01 NOTE — Telephone Encounter (Signed)
Spoke to patient, who reports of dry cough at times productive with clear sputum, head/nasal congestion, wheezing and increased sob x1w. Denied f/c/s. She has had all three covid vaccines and flu shot.  She is using albuterol BID, breo daily and mucinex Q4H with no relief in sx.   Dr. Mortimer Fries, please advise. Dr. Patsey Berthold is unavailable. Thanks

## 2020-06-01 NOTE — Telephone Encounter (Signed)
Lm for patient.  

## 2020-06-01 NOTE — Telephone Encounter (Signed)
Lm x2 for patient on both numbers provided.

## 2020-06-01 NOTE — Telephone Encounter (Signed)
Pred 20 mg daily for 7 days Z pak  Doounebs q4 as needed  Recommend Covid testing

## 2020-06-02 MED ORDER — AZITHROMYCIN 250 MG PO TABS: ORAL_TABLET | ORAL | 0 refills | Status: AC

## 2020-06-02 MED ORDER — PREDNISONE 20 MG PO TABS: 20.0000 mg | ORAL_TABLET | Freq: Every day | ORAL | 0 refills | Status: DC

## 2020-06-02 NOTE — Telephone Encounter (Signed)
Patient is aware of recommendations and voiced her understanding.  Rx for prednisone and zpak has been sent to preferred pharmacy.  Provided patient with testing site information.  Nothing further needed.

## 2020-06-26 DIAGNOSIS — Z0100 Encounter for examination of eyes and vision without abnormal findings: Secondary | ICD-10-CM | POA: Diagnosis not present

## 2020-07-13 ENCOUNTER — Other Ambulatory Visit: Payer: Self-pay | Admitting: Family Medicine

## 2020-07-13 DIAGNOSIS — E7800 Pure hypercholesterolemia, unspecified: Secondary | ICD-10-CM

## 2020-07-16 ENCOUNTER — Telehealth: Payer: Self-pay

## 2020-07-16 NOTE — Telephone Encounter (Signed)
Copied from [place] [phone]. Topic: General - Other >> Jul 16, 2020  1:00 PM Pawlus, Brayton Layman A wrote: Reason for CRM: Pt wanted to know why her medication was changed, wanted to speak with Dr Ancil Boozer or her nurse about the medication change.

## 2020-07-27 ENCOUNTER — Other Ambulatory Visit: Payer: Self-pay | Admitting: Family Medicine

## 2020-07-27 DIAGNOSIS — G4726 Circadian rhythm sleep disorder, shift work type: Secondary | ICD-10-CM

## 2020-07-27 DIAGNOSIS — F5104 Psychophysiologic insomnia: Secondary | ICD-10-CM

## 2020-07-27 NOTE — Telephone Encounter (Signed)
Requested medication (s) are due for refill today: no  Requested medication (s) are on the active medication list: yes  Last refill:  03/13/2020  Future visit scheduled: yes  Notes to clinic: this refill cannot be delegated    Requested Prescriptions  Pending Prescriptions Disp Refills   zolpidem (AMBIEN CR) 12.5 MG CR tablet [Pharmacy Med Name: Zolpidem Tartrate ER 12.5 MG Oral Tablet Extended Release] 90 tablet 0    Sig: TAKE 1 TABLET BY MOUTH AT BEDTIME      Not Delegated - Psychiatry:  Anxiolytics/Hypnotics Failed - 07/27/2020 11:35 AM      Failed - This refill cannot be delegated      Failed - Urine Drug Screen completed in last 360 days      Passed - Valid encounter within last 6 months    Recent Outpatient Visits           2 months ago Senile purpura [facility])   [facility] Steele Sizer, MD   6 months ago Senile purpura [facility])   [facility] Steele Sizer, MD   10 months ago [facility] Steele Sizer, MD   10 months ago Senile purpura [facility])   [facility] Steele Sizer, MD   1 year ago Chronic cough   [facility] Steele Sizer, MD       Future Appointments             In 1 month Ancil Boozer, Drue Stager, MD [facility], [place]   In 8 months  [facility], [facility]

## 2020-07-30 ENCOUNTER — Telehealth: Payer: Self-pay | Admitting: Pulmonary Disease

## 2020-07-30 MED ORDER — PREDNISONE 10 MG (21) PO TBPK: ORAL_TABLET | ORAL | 0 refills | Status: DC

## 2020-07-30 MED ORDER — ALBUTEROL SULFATE HFA 108 (90 BASE) MCG/ACT IN AERS: 2.0000 | INHALATION_SPRAY | Freq: Four times a day (QID) | RESPIRATORY_TRACT | 6 refills | Status: AC | PRN

## 2020-07-30 MED ORDER — AZITHROMYCIN 250 MG PO TABS: ORAL_TABLET | ORAL | 0 refills | Status: AC

## 2020-07-30 NOTE — Telephone Encounter (Signed)
Call in albuterol rescue inhaler.  Can call in prednisone taper pack and Azithromycin pack.  Call if no better by Monday or Tuesday.

## 2020-07-30 NOTE — Telephone Encounter (Signed)
Called and spoke to patient.  patient reports of wheezing, increased sob with exertion and dry cough at times prod with thick clear sputum. Sx have been present for 1 week.  Denied fever, chills or sweats.  She is using breo once daily. She does not have a rescue inhaler.  She has not used any OTC meds to help with sx.  Fully vaccinated against covid and flu.   Dr. Patsey Berthold, please advise. Thanks

## 2020-07-30 NOTE — Telephone Encounter (Signed)
Patient is aware of below recommendations and voiced her understanding.  Rx for ventolin, zpak and prednisone has been sent to preferred pharmacy.  Nothing further needed at this time.

## 2020-09-09 ENCOUNTER — Ambulatory Visit: Payer: BC Managed Care – PPO | Admitting: Family Medicine

## 2020-09-17 ENCOUNTER — Ambulatory Visit (INDEPENDENT_AMBULATORY_CARE_PROVIDER_SITE_OTHER): Payer: BC Managed Care – PPO | Admitting: Family Medicine

## 2020-09-17 ENCOUNTER — Other Ambulatory Visit: Payer: Self-pay

## 2020-09-17 ENCOUNTER — Encounter: Payer: Self-pay | Admitting: Family Medicine

## 2020-09-17 VITALS — BP 138/76 | HR 86 | Temp 98.1°F | Resp 16 | Ht 62.0 in | Wt 174.0 lb

## 2020-09-17 DIAGNOSIS — J302 Other seasonal allergic rhinitis: Secondary | ICD-10-CM | POA: Diagnosis not present

## 2020-09-17 DIAGNOSIS — M5441 Lumbago with sciatica, right side: Secondary | ICD-10-CM

## 2020-09-17 DIAGNOSIS — R7303 Prediabetes: Secondary | ICD-10-CM

## 2020-09-17 DIAGNOSIS — I10 Essential (primary) hypertension: Secondary | ICD-10-CM | POA: Diagnosis not present

## 2020-09-17 DIAGNOSIS — D692 Other nonthrombocytopenic purpura: Secondary | ICD-10-CM | POA: Diagnosis not present

## 2020-09-17 DIAGNOSIS — M170 Bilateral primary osteoarthritis of knee: Secondary | ICD-10-CM

## 2020-09-17 DIAGNOSIS — E7800 Pure hypercholesterolemia, unspecified: Secondary | ICD-10-CM

## 2020-09-17 DIAGNOSIS — R062 Wheezing: Secondary | ICD-10-CM

## 2020-09-17 DIAGNOSIS — R739 Hyperglycemia, unspecified: Secondary | ICD-10-CM | POA: Diagnosis not present

## 2020-09-17 DIAGNOSIS — R69 Illness, unspecified: Secondary | ICD-10-CM | POA: Diagnosis not present

## 2020-09-17 DIAGNOSIS — G8929 Other chronic pain: Secondary | ICD-10-CM

## 2020-09-17 DIAGNOSIS — J4540 Moderate persistent asthma, uncomplicated: Secondary | ICD-10-CM

## 2020-09-17 DIAGNOSIS — G4726 Circadian rhythm sleep disorder, shift work type: Secondary | ICD-10-CM

## 2020-09-17 DIAGNOSIS — B351 Tinea unguium: Secondary | ICD-10-CM

## 2020-09-17 DIAGNOSIS — F5104 Psychophysiologic insomnia: Secondary | ICD-10-CM

## 2020-09-17 MED ORDER — TRAMADOL HCL 50 MG PO TABS: 50.0000 mg | ORAL_TABLET | Freq: Two times a day (BID) | ORAL | 2 refills | Status: DC | PRN

## 2020-09-17 MED ORDER — MONTELUKAST SODIUM 10 MG PO TABS: 10.0000 mg | ORAL_TABLET | Freq: Every day | ORAL | 1 refills | Status: DC

## 2020-09-17 MED ORDER — ROSUVASTATIN CALCIUM 10 MG PO TABS: 10.0000 mg | ORAL_TABLET | Freq: Every day | ORAL | 1 refills | Status: DC

## 2020-09-17 MED ORDER — VALSARTAN-HYDROCHLOROTHIAZIDE 160-12.5 MG PO TABS: 1.0000 | ORAL_TABLET | Freq: Every day | ORAL | 1 refills | Status: DC

## 2020-09-17 MED ORDER — TERBINAFINE HCL 250 MG PO TABS
250.0000 mg | ORAL_TABLET | Freq: Every day | ORAL | 0 refills | Status: DC
Start: 2020-09-17 — End: 2020-12-24

## 2020-09-17 MED ORDER — AMLODIPINE BESYLATE 10 MG PO TABS: 10.0000 mg | ORAL_TABLET | Freq: Every day | ORAL | 1 refills | Status: DC

## 2020-09-17 NOTE — Progress Notes (Signed)
Name: Hayley Howe   MRN: [id number]    DOB: 1946-04-07   Date:09/17/2020       Progress Note  Subjective  Chief Complaint  Follow Up  HPI  Urge incontinence: it is intermittent and takes Detrol prn only, not lately. Stable   Chronic sinusitis: she was seen by Dr. Duwayne Heck - pulmonologist , had a normal spirometry, CT chest  showed some scarring, CT sinus showed chronic sinusitis back in 2020 and was treated but continued to have the cough, and also has post-nasal drainage. She is using nasal spray and Dr. Duwayne Heck is now treating her for asthma with [facility], she states it has helped with SOB, wheezing, but continues to clear her throat and had a dry cough daily. She states it is very bothersome. She has seen Dr. Tami Ribas in the past also   Chronic Low Back: she has a long history of low back pain, not secondary to trauma, DDD lumbar spineand also scoliosis.taking Tramadol daily to control symptoms(takes 1 daily, takes 2nd tablets a few times a week), she is also on Skelaxinprn unable to tolerate Flexeril - it makes her sleepy. Pain is constant -pain right now is 3/10 because he was off work yesterday, average pain 5-6/10 but can go up to 8/10 at the end of work week. Occasionally the pain radiates to right lower leg,and when radiating it is described as numbnessthat is seldom, and not recently. She goes to chiropractor( Dr. Genoveva Ill a month. 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 She sleepsfrom around2pm-8:30pmfor about 5-7hours when she sleeps during the dayTaking Ambien, denies side effects of medication, she is aware of FDA guidelines however unable to sleep with lower dose of Ambien.She only takes medications the nights that she works, she still has medication at home and pharmacy will request when she is due for refills. .   OA: she has aching pain on left knee,  s/pleftknee replaced, only triggered by kneeling down, surgery was done by Dr. Rudene Christians in 2012 and no problems.   Pain right LEX:NTZG is under toe nail, has been going on for months, tender to touch or pressure. First toe   Hyperglycemia: last hgbA1C down to 5.9 %, denies polyphagia, polydipsia or polyuria. Trying to eat healthy.   HTN: taking valsartan hctz, and also Norvasc, she states no longer having dizziness, no chest pain or palpitation   Dyslipidemia: last LDL went up from 102 to 111 we switched from Pravastatin to Rosuvastatin  and we will recheck labs today   Grieving: sister got sick back in 05-22-22, and died 2019/11/08 from cancer, they were best friends and she has been struggling, discussed hospice counseling . She was going to retire last year but decided to work to keep herself busy, she has one sister that lives in [place] but they are not as close. She states she has good days and bad days.   Senile Purpura: stable , recurrent on arms, stable    Patient Active Problem List   Diagnosis Date Noted  . Senile purpura ([place]) 05/14/2020  . Moderate persistent asthma without complication 01/74/9449  . Pre-diabetes 05/14/2020  . Hyperglycemia 07/13/2017  . Chronic constipation 12/15/2016  . Primary osteoarthritis of left hip 11/05/2015  . Scoliosis 11/05/2015  . Chronic low back pain 10/30/2014  . Benign essential HTN 10/27/2014  . Bradycardia 10/27/2014  . Insomnia 10/27/2014  . Headache, temporal  10/27/2014  . Hypercholesteremia 10/27/2014  . Obesity (BMI 30-39.9) 10/27/2014  . Changing sleep-work schedule, affecting sleep 10/27/2014  . Vitamin D deficiency 10/27/2014  . Arthritis of knee, degenerative 09/24/2009    Past Surgical History:  Procedure Laterality Date  . ABDOMINAL HYSTERECTOMY    . BREAST BIOPSY Right 01/21/2013   stereo - benign  . COLONOSCOPY WITH PROPOFOL N/A 06/30/2016   Procedure: COLONOSCOPY WITH PROPOFOL;  Surgeon: Jonathon Bellows, MD;  Location:  [facility] ENDOSCOPY;  Service: Endoscopy;  Laterality: N/A;  . KNEE SURGERY Left 2012   Dr. Rudene Christians  . OOPHORECTOMY    . TUBAL LIGATION    . VAGINAL PROLAPSE REPAIR      Family History  Problem Relation Age of Onset  . Breast cancer Sister 72       in year 2013  . Hyperlipidemia Mother   . Diabetes Mother   . Cancer Father        Leukemia  . Hyperlipidemia Father   . Lung cancer Brother   . Breast cancer Sister   . Heart failure Brother     Social History   Tobacco Use  . Smoking status: Former Smoker    Packs/day: 0.25    Years: 1.00    Pack years: 0.25    Types: Cigarettes    Start date: 08/13/1967    Quit date: 04/18/1977    Years since quitting: 43.4  . Smokeless tobacco: Never Used  . Tobacco comment: smoking cessation materials not required  Substance Use Topics  . Alcohol use: No    Alcohol/week: 0.0 standard drinks     Current Outpatient Medications:  .  albuterol (VENTOLIN HFA) 108 (90 Base) MCG/ACT inhaler, Inhale 2 puffs into the lungs every 6 (six) hours as needed for wheezing or shortness of breath., Disp: 8 g, Rfl: 6 .  amLODipine (NORVASC) 10 MG tablet, Take 1 tablet (10 mg total) by mouth daily., Disp: 90 tablet, Rfl: 1 .  aspirin EC 81 MG tablet, Take 1 tablet (81 mg total) by mouth daily., Disp: 30 tablet, Rfl: 0 .  Azelastine-Fluticasone 137-50 MCG/ACT SUSP, Place 1 spray into both nostrils 2 (two) times daily., Disp: , Rfl:  .  Cholecalciferol (VITAMIN D) 2000 UNITS tablet, Take 1 tablet by mouth daily., Disp: , Rfl:  .  fluticasone furoate-vilanterol (BREO ELLIPTA) 200-25 MCG/INH AEPB, Inhale 1 puff into the lungs daily., Disp: 28 each, Rfl: 11 .  loratadine (CLARITIN) 10 MG tablet, Take 1 tablet (10 mg total) by mouth daily., Disp: 30 tablet, Rfl: 0 .  Magnesium 250 MG TABS, Take 1 tablet by mouth daily., Disp: , Rfl:  .  metaxalone (SKELAXIN) 800 MG tablet, Take 1 tablet (800 mg total) by mouth 3 (three) times daily as needed. for muscle spams, Disp: 90  tablet, Rfl: 0 .  montelukast (SINGULAIR) 10 MG tablet, Take 1 tablet (10 mg total) by mouth at bedtime., Disp: 90 tablet, Rfl: 1 .  Multiple Vitamin (MULTIVITAMIN ADULT PO), Take by mouth., Disp: , Rfl:  .  predniSONE (DELTASONE) 20 MG tablet, Take 1 tablet (20 mg total) by mouth daily with breakfast., Disp: 7 tablet, Rfl: 0 .  predniSONE (STERAPRED UNI-PAK 21 TAB) 10 MG (21) TBPK tablet, Use as directed., Disp: 21 each, Rfl: 0 .  rosuvastatin (CRESTOR) 10 MG tablet, Take 1 tablet (10 mg total) by mouth daily., Disp: 90 tablet, Rfl: 1 .  tolterodine (DETROL LA) 2 MG 24 hr capsule, Take 1 capsule (2 mg total) by mouth  daily., Disp: 90 capsule, Rfl: 1 .  traMADol (ULTRAM) 50 MG tablet, Take 1 tablet (50 mg total) by mouth 2 (two) times daily as needed., Disp: 60 tablet, Rfl: 2 .  valsartan-hydrochlorothiazide (DIOVAN-HCT) 160-12.5 MG tablet, Take 1 tablet by mouth daily., Disp: 90 tablet, Rfl: 1 .  vitamin C (ASCORBIC ACID) 500 MG tablet, Take 1 tablet by mouth daily., Disp: , Rfl:  .  zolpidem (AMBIEN CR) 12.5 MG CR tablet, TAKE 1 TABLET BY MOUTH AT BEDTIME, Disp: 90 tablet, Rfl: 0  No Known Allergies  I personally reviewed active problem list, medication list, allergies, family history, social history, health maintenance with the patient/caregiver today.   ROS  Constitutional: Negative for fever or weight change.  Respiratory: Negative for cough and shortness of breath.   Cardiovascular: Negative for chest pain or palpitations.  Gastrointestinal: Negative for abdominal pain, no bowel changes.  Musculoskeletal: Negative for gait problem or joint swelling.  Skin: Negative for rash.  Neurological: Negative for dizziness or headache.  No other specific complaints in a complete review of systems (except as listed in HPI above).  Objective  Vitals:   09/17/20 0917  BP: 138/76  Pulse: 86  Resp: 16  Temp: 98.1 F (36.7 C)  TempSrc: Oral  SpO2: 97%  Weight: 174 lb (78.9 kg)  Height:  5\' 2"  (1.575 m)    Body mass index is 31.83 kg/m.  Physical Exam  Constitutional: Patient appears well-developed and well-nourished. Obese  No distress.  HEENT: head atraumatic, normocephalic, pupils equal and reactive to light,neck supple  Cardiovascular: Normal rate, regular rhythm and normal heart sounds.  No murmur heard. No BLE edema. Pulmonary/Chest: Effort normal and breath sounds normal. No respiratory distress. Abdominal: Soft.  There is no tenderness. Psychiatric: Patient has a normal mood and affect. behavior is normal. Judgment and thought content normal. Toe: she has thick and brittle nail on right first toe, thick toenail and pain during touch, thick skin underneath no redness or oozing   PHQ2/9: Depression screen [facility] 2/9 09/17/2020 05/14/2020 04/21/2020 01/09/2020 09/18/2019  Decreased Interest 0 0 0 0 0  Down, Depressed, Hopeless 0 0 0 0 0  PHQ - 2 Score 0 0 0 0 0  Altered sleeping - - - - 0  Tired, decreased energy - - - - 0  Change in appetite - - - - 0  Feeling bad or failure about yourself  - - - - 0  Trouble concentrating - - - - 0  Moving slowly or fidgety/restless - - - - 0  Suicidal thoughts - - - - 0  PHQ-9 Score - - - - 0  Difficult doing work/chores - - - - Not difficult at all  Some recent data might be hidden    phq 9 is negative   Fall Risk: Fall Risk  09/17/2020 05/14/2020 04/21/2020 01/09/2020 09/18/2019  Falls in the past year? 0 0 0 0 0  Number falls in past yr: 0 0 0 0 0  Injury with Fall? 0 0 0 0 0  Risk for fall due to : - - No Fall Risks - -  Risk for fall due to: Comment - - - - -  Follow up - - Falls prevention discussed - Falls evaluation completed     Functional Status Survey: Is the patient deaf or have difficulty hearing?: No Does the patient have difficulty seeing, even when wearing glasses/contacts?: No Does the patient have difficulty concentrating, remembering, or making decisions?: No Does the  patient have difficulty walking or  climbing stairs?: No Does the patient have difficulty dressing or bathing?: No Does the patient have difficulty doing errands alone such as visiting a doctor's office or shopping?: No   Assessment & Plan  1. Senile purpura ([facility])  Stable   2. Hypercholesteremia  - rosuvastatin (CRESTOR) 10 MG tablet; Take 1 tablet (10 mg total) by mouth daily.  Dispense: 90 tablet; Refill: 1 - Lipid panel  3. Moderate persistent asthma without complication  Under the care of Dr. Duwayne Heck   4. Primary osteoarthritis of both knees  - traMADol (ULTRAM) 50 MG tablet; Take 1 tablet (50 mg total) by mouth 2 (two) times daily as needed.  Dispense: 60 tablet; Refill: 2  5. Chronic insomnia   6. Benign essential HTN  - amLODipine (NORVASC) 10 MG tablet; Take 1 tablet (10 mg total) by mouth daily.  Dispense: 90 tablet; Refill: 1 - valsartan-hydrochlorothiazide (DIOVAN-HCT) 160-12.5 MG tablet; Take 1 tablet by mouth daily.  Dispense: 90 tablet; Refill: 1 - COMPLETE METABOLIC PANEL WITH GFR  7. Pre-diabetes  - Hemoglobin A1c  8. Hyperglycemia  - Hemoglobin A1c  9. Chronic right-sided low back pain with right-sided sciatica  - traMADol (ULTRAM) 50 MG tablet; Take 1 tablet (50 mg total) by mouth 2 (two) times daily as needed.  Dispense: 60 tablet; Refill: 2  10. Seasonal allergic rhinitis, unspecified trigger  - montelukast (SINGULAIR) 10 MG tablet; Take 1 tablet (10 mg total) by mouth at bedtime.  Dispense: 90 tablet; Refill: 1  11. Wheezing  - montelukast (SINGULAIR) 10 MG tablet; Take 1 tablet (10 mg total) by mouth at bedtime.  Dispense: 90 tablet; Refill: 1  12. Changing sleep-work schedule, affecting sleep  On Ambien   13. Onychomycosis  - terbinafine (LAMISIL) 250 MG tablet; Take 1 tablet (250 mg total) by mouth daily.  Dispense: 90 tablet; Refill: 0

## 2020-09-18 LAB — COMPLETE METABOLIC PANEL WITH GFR
AG Ratio: 1.2 (calc) (ref 1.0–2.5)
ALT: 24 U/L (ref 6–29)
AST: 27 U/L (ref 10–35)
Albumin: 4.2 g/dL (ref 3.6–5.1)
Alkaline phosphatase (APISO): 109 U/L (ref 37–153)
BUN: 18 mg/dL (ref 7–25)
CO2: 30 mmol/L (ref 20–32)
Calcium: 9.6 mg/dL (ref 8.6–10.4)
Chloride: 103 mmol/L (ref 98–110)
Creat: 0.84 mg/dL (ref 0.60–0.93)
GFR, Est African American: 80 mL/min/{1.73_m2} (ref >=60)
GFR, Est Non African American: 69 mL/min/{1.73_m2} (ref >=60)
Globulin: 3.4 g/dL (calc) (ref 1.9–3.7)
Glucose, Bld: 80 mg/dL (ref 65–99)
Potassium: 4.5 mmol/L (ref 3.5–5.3)
Sodium: 139 mmol/L (ref 135–146)
Total Bilirubin: 0.4 mg/dL (ref 0.2–1.2)
Total Protein: 7.6 g/dL (ref 6.1–8.1)

## 2020-09-18 LAB — LIPID PANEL
Cholesterol: 200 mg/dL — ABNORMAL HIGH (ref <200)
HDL: 84 mg/dL (ref >=50)
LDL Cholesterol (Calc): 102 mg/dL (calc) — ABNORMAL HIGH
Non-HDL Cholesterol (Calc): 116 mg/dL (calc) (ref <130)
Total CHOL/HDL Ratio: 2.4 (calc) (ref <5.0)
Triglycerides: 49 mg/dL (ref <150)

## 2020-09-18 LAB — HEMOGLOBIN A1C
Hgb A1c MFr Bld: 6 % of total Hgb — ABNORMAL HIGH (ref <5.7)
Mean Plasma Glucose: 126 mg/dL
eAG (mmol/L): 7 mmol/L

## 2020-10-04 IMAGING — CT CT MAXILLOFACIAL W/O CM
3 of 4 series · 14 of 47 positions shown, 16 images · non-contrast
Comparison: CT 02/22/2019

CLINICAL DATA: Sinus pressure, treated with antibiotics without
relief.

EXAM:
CT MAXILLOFACIAL WITHOUT CONTRAST
TECHNIQUE: Multidetector CT imaging of the maxillofacial structures was
performed. Multiplanar CT image reconstructions were also generated.

[Series 2: max soft (person_name) · axial · 0.34mm/px · z∈[-191,-47]mm · 8 of 87 slices shown, 10 images]
[im 9/87  brain]
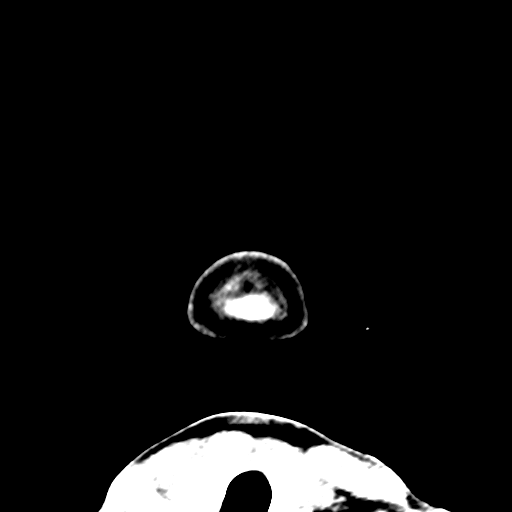
[im 9/87  bone]
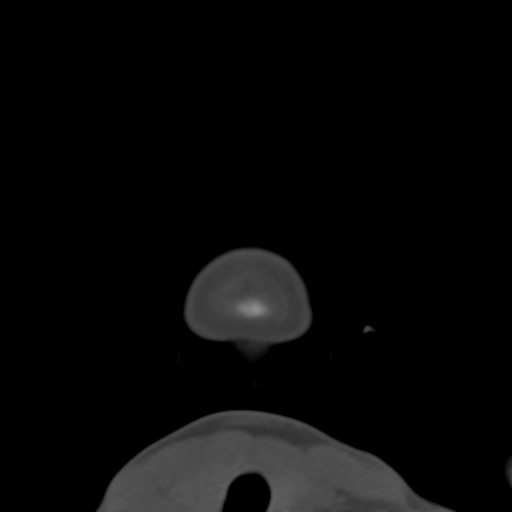
[im 21/87  bone]
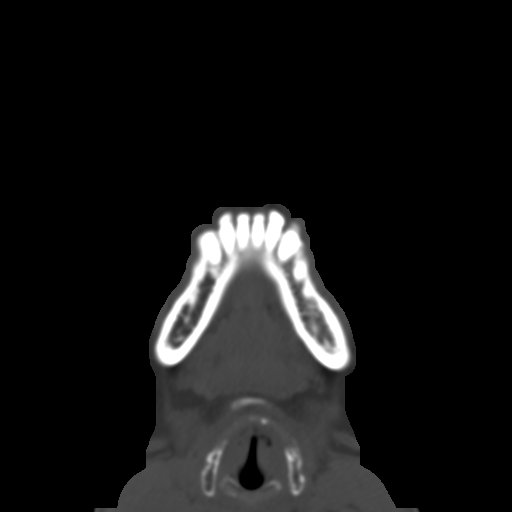
[im 30/87  bone]
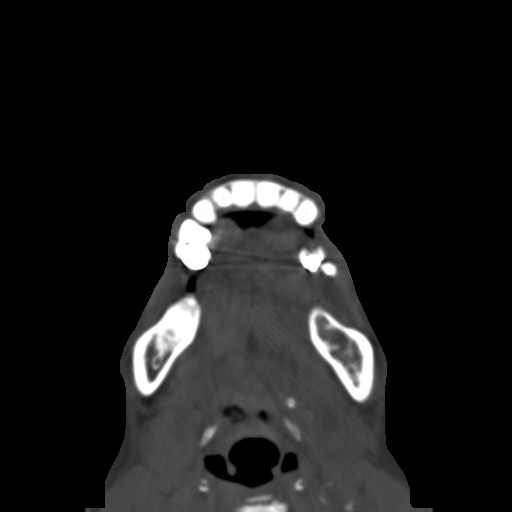
[im 39/87  bone]
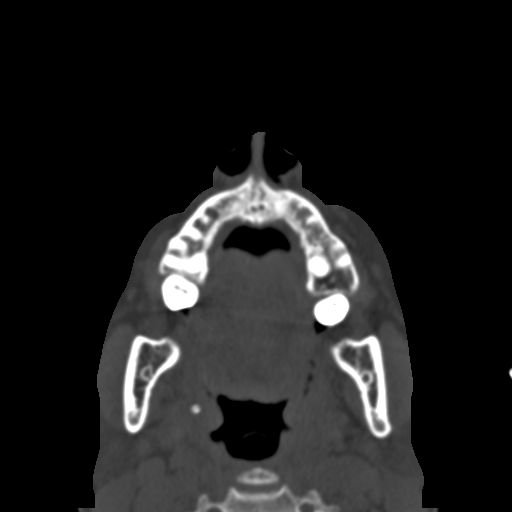
[im 51/87  brain]
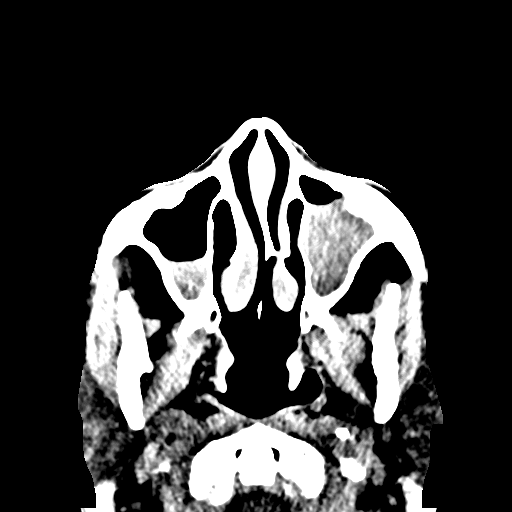
[im 51/87  bone]
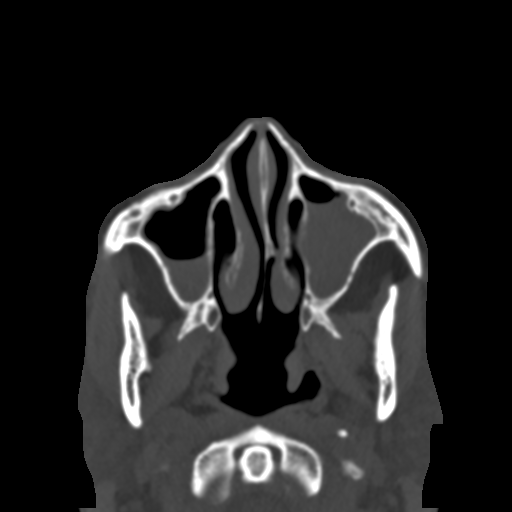
[im 60/87  bone]
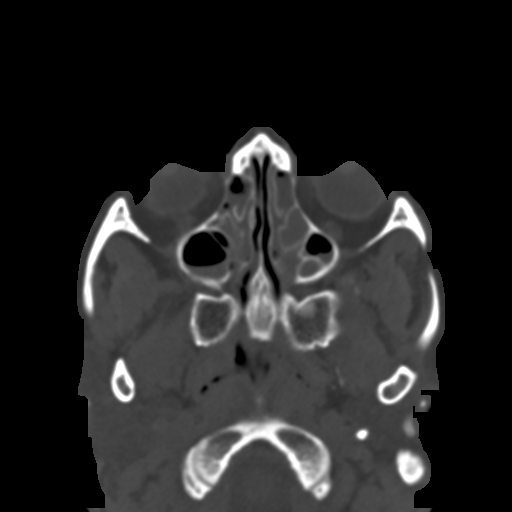
[im 69/87  bone]
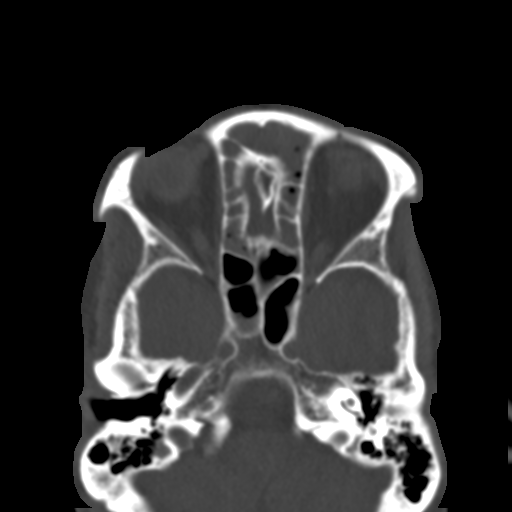
[im 81/87  bone]
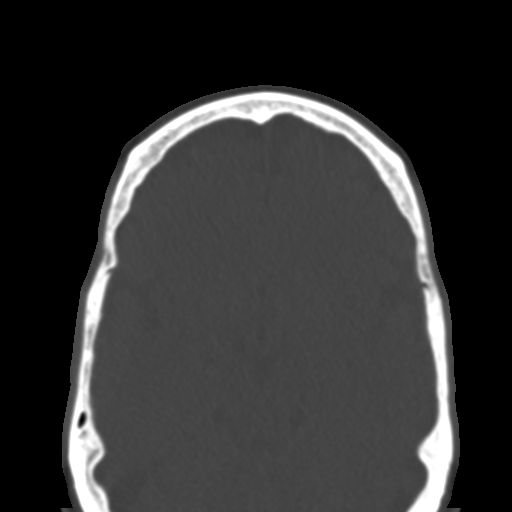

[Series 7: sagittal soft · sagittal · 0.24mm/px · 3 of 78 slices shown]
[im 26/78  bone]
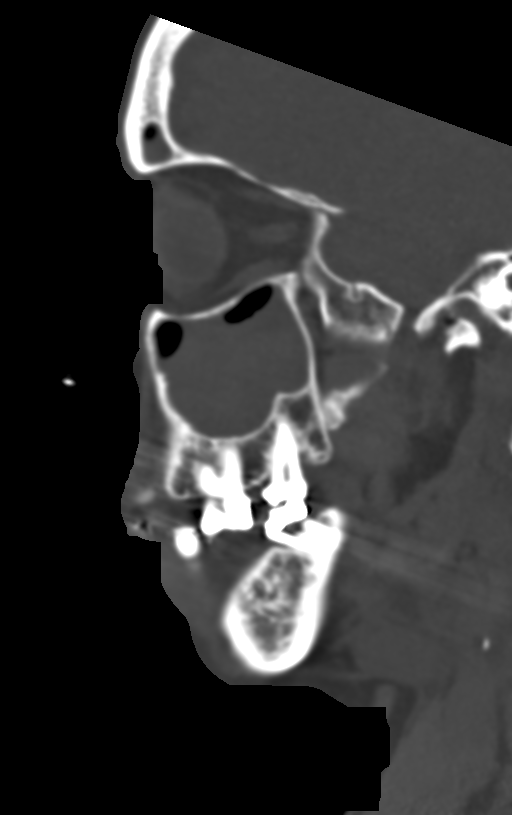
[im 39/78  bone]
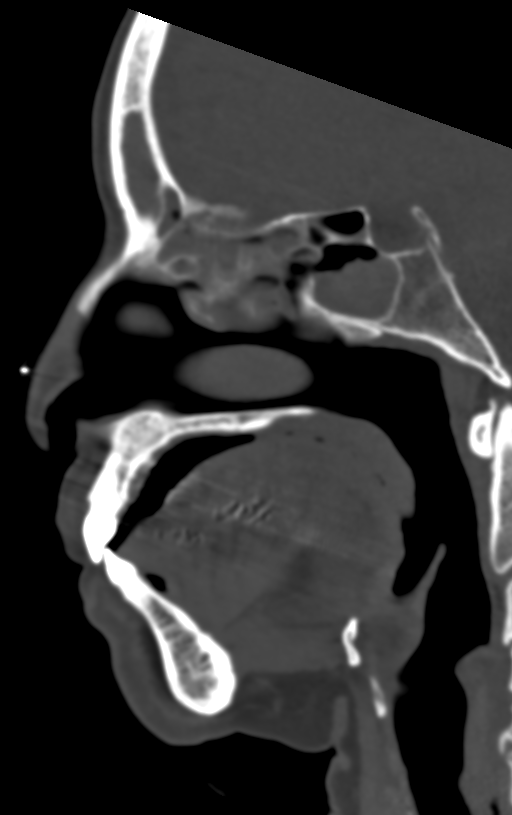
[im 52/78  bone]
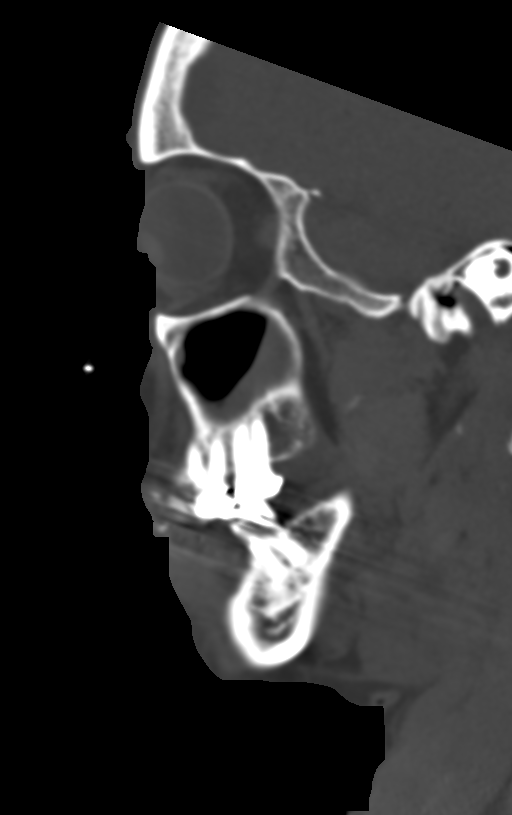

[Series 8: coronal bone · coronal · 0.30mm/px · 3 of 63 slices shown]
[im 16/63  bone]
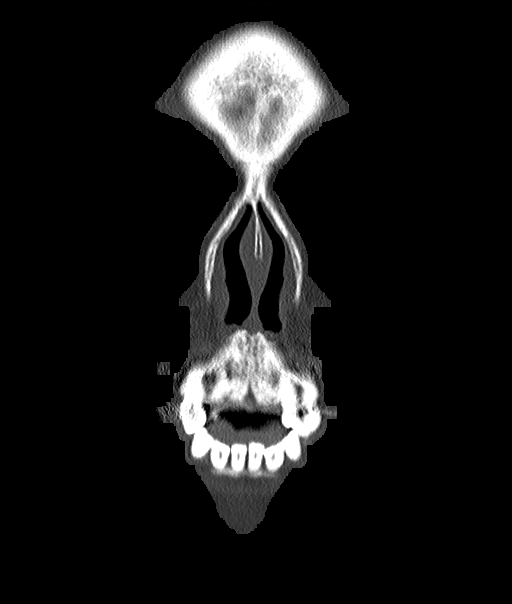
[im 32/63  bone]
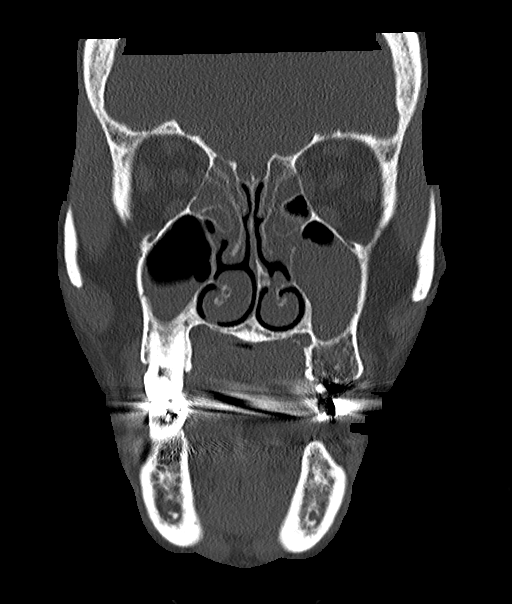
[im 47/63  bone]
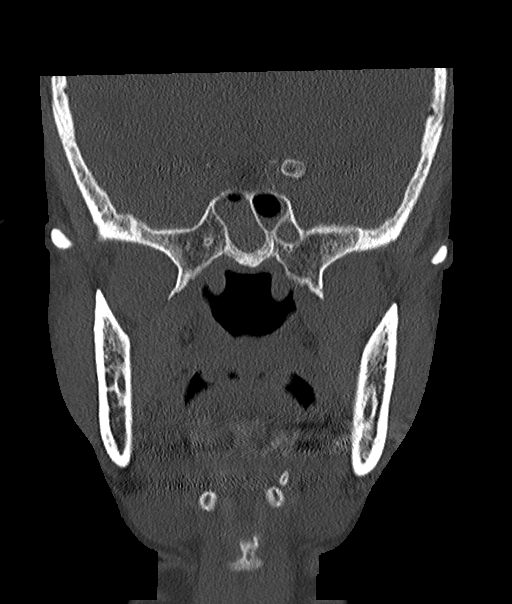

[14 of 47 positions shown; findings below may reference images not displayed]

FINDINGS: Osseous: No fracture or mandibular dislocation. No destructive
process.

Orbits: Negative. No traumatic or inflammatory finding.

Sinuses: Near complete opacification of the frontal sinuses, ethmoid
air cells and sphenoid sinuses. This is similar prior study with a
small amount of aeration now seen in the left frontal sinus and
scattered ethmoid air cells which is a slight improvement since
prior study. Mucosal thickening within the maxillary sinuses as
well. There are now air-fluid levels also in the maxillary sinuses.

Soft tissues: Negative

Limited intracranial: Negative
IMPRESSION: Extensive acute on chronic sinusitis. Air-fluid levels in the
maxillary sinuses are new since prior study. Minimal improvement
with slight aeration now seen in the left frontal sinus and
scattered ethmoid air cells.

## 2020-10-21 ENCOUNTER — Telehealth: Payer: Self-pay | Admitting: Pulmonary Disease

## 2020-10-21 MED ORDER — PREDNISONE 20 MG PO TABS: 20.0000 mg | ORAL_TABLET | Freq: Every day | ORAL | 0 refills | Status: DC

## 2020-10-21 MED ORDER — AZITHROMYCIN 250 MG PO TABS: ORAL_TABLET | ORAL | 0 refills | Status: AC

## 2020-10-21 NOTE — Telephone Encounter (Signed)
Lm for patient.  

## 2020-10-21 NOTE — Telephone Encounter (Signed)
Spoke to patient, who reports of wheezing, sob with exertion and dry cough at times prod with clear to yellow sputum x9mo Denied fever, chills or sweats. Fully vaccinated against covid and flu.  She is using Breo once daily and Ventolin BID. She does wear supplemental oxygen.  Dr. Mortimer Fries, please advise. Thanks.

## 2020-10-21 NOTE — Telephone Encounter (Signed)
Patient is aware of recommendations and voiced her understanding.  Prednisone and zpak has been sent to preferred pharmacy. Nothing further needed.  

## 2020-11-01 ENCOUNTER — Other Ambulatory Visit: Payer: Self-pay | Admitting: Pulmonary Disease

## 2020-11-21 DIAGNOSIS — G8929 Other chronic pain: Secondary | ICD-10-CM | POA: Diagnosis not present

## 2020-11-21 DIAGNOSIS — B359 Dermatophytosis, unspecified: Secondary | ICD-10-CM | POA: Diagnosis not present

## 2020-11-21 DIAGNOSIS — N3281 Overactive bladder: Secondary | ICD-10-CM | POA: Diagnosis not present

## 2020-11-21 DIAGNOSIS — M810 Age-related osteoporosis without current pathological fracture: Secondary | ICD-10-CM | POA: Diagnosis not present

## 2020-11-21 DIAGNOSIS — E669 Obesity, unspecified: Secondary | ICD-10-CM | POA: Diagnosis not present

## 2020-11-21 DIAGNOSIS — G4700 Insomnia, unspecified: Secondary | ICD-10-CM | POA: Diagnosis not present

## 2020-11-21 DIAGNOSIS — J45909 Unspecified asthma, uncomplicated: Secondary | ICD-10-CM | POA: Diagnosis not present

## 2020-11-21 DIAGNOSIS — I10 Essential (primary) hypertension: Secondary | ICD-10-CM | POA: Diagnosis not present

## 2020-11-21 DIAGNOSIS — E785 Hyperlipidemia, unspecified: Secondary | ICD-10-CM | POA: Diagnosis not present

## 2020-11-21 DIAGNOSIS — M1990 Unspecified osteoarthritis, unspecified site: Secondary | ICD-10-CM | POA: Diagnosis not present

## 2020-12-09 ENCOUNTER — Telehealth: Payer: Self-pay

## 2020-12-09 ENCOUNTER — Other Ambulatory Visit: Payer: Self-pay

## 2020-12-09 MED ORDER — FLUTICASONE-SALMETEROL 250-50 MCG/ACT IN AEPB: 1.0000 | INHALATION_SPRAY | Freq: Two times a day (BID) | RESPIRATORY_TRACT | 0 refills | Status: DC

## 2020-12-09 NOTE — Telephone Encounter (Signed)
Received a PA stating that  Fluticasone Furoate- Vilanterol 200-25 MCG was denied by insurance, patient has tried Firefighter and Trelegy 200.   Other options are : Advair,Anoro,Flovent,Spiriva, stiolto

## 2020-12-09 NOTE — Telephone Encounter (Signed)
Received a PA stating that  Fluticasone Furoate- Vilanterol 200-25 MCG was denied by insurance, patient has tried Firefighter and Trelegy 200.

## 2020-12-09 NOTE — Telephone Encounter (Signed)
Lets try Advair 250/50 1 inhalation twice a day rinse mouth well after use.

## 2020-12-23 NOTE — Progress Notes (Signed)
Name: Hayley Howe   MRN: [id number]    DOB: [birth year]   Date:12/24/2020       Progress Note  Subjective  Chief Complaint  Follow Up  HPI  Urge incontinence: it is intermittent and takes Detrol prn only, not lately. She thinks symptoms have improved   Chronic sinusitis/Ashma : she was seen by Dr. Duwayne Heck - pulmonologist , had a normal spirometry, CT chest  showed some scarring, CT sinus showed chronic sinusitis back in 2020 and was treated but continued to have the cough, and also has post-nasal drainage. She is using nasal spray and Dr. Duwayne Heck is now treating her for asthma with [facility] but next rx will be Advair since Breo no longer on formulary , she states it has helped with SOB, wheezing, but continues to clear her throat and had a dry cough daily.    Chronic Low Back: she has a long history of low back pain, not secondary to trauma, DDD lumbar spine and also scoliosis. taking Tramadol daily to control symptoms (takes 1 daily, takes 2nd tablets a few times a week), she is also on Skelaxin prn unable to tolerate Flexeril - it makes her sleepy. Pain is constant -pain right now is 3/10 but she was off yesterday. She noticed a pain on left buttocks over one month ago and radiates to left lateral leg described a pain and numbness, no bowel or bladder incontinence  . She signed a pain contract and had drug screen last year and is due for a repeat No recent visit with chiropractor becase Dr. Rock Nephew was out of the office, advised her to go back.    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 She sleeps from around 2 pm-8:30pm for about 5-7 hours when she sleeps during the day Taking Ambien, denies side effects of medication, she is aware of FDA guidelines however unable to sleep with lower dose of Ambien. She only takes medications the nights that she works, reviewed controlled medication database    OA: she has aching pain on left knee, s/p left knee  replaced, only triggered by kneeling down, surgery was done by Dr. Rudene Christians in 2012 and no problems. Unchanged    Hyperglycemia: last hgbA1C was 6 % denies polyphagia, polydipsia or polyuria.  Discussed well balanced diet and to monitor carb intake.   HTN: taking valsartan hctz, and also Norvasc, she states no longer having dizziness, no chest pain or palpitation . She still has refills of medicationsa at home    Dyslipidemia: last LDL stable on Crestor , no myalgia.    Grieving: sister got sick back in May 31, 2022, and died November 17, 2019 from cancer, they were best friends and she has been struggling, discussed hospice counseling . She was going to retire last year but decided to work to keep herself busy, she has one sister that lives in [place] but they are not as close. She states she has good days and bad days.   Senile Purpura: stable , recurrent on arms, reassurance given   Patient Active Problem List   Diagnosis Date Noted   Senile purpura ([place]) 05/14/2020   Moderate persistent asthma without complication [id number]   Pre-diabetes 05/14/2020   Hyperglycemia 07/13/2017   Chronic constipation 12/15/2016   Primary osteoarthritis of left hip 11/05/2015   Scoliosis 11/05/2015   Chronic low back pain 10/30/2014   Benign essential HTN 10/27/2014   Bradycardia 10/27/2014   Insomnia 10/27/2014   Headache, temporal 10/27/2014  Hypercholesteremia 10/27/2014   Obesity (BMI 30-39.9) 10/27/2014   Changing sleep-work schedule, affecting sleep 10/27/2014   Vitamin D deficiency 10/27/2014   Arthritis of knee, degenerative 09/24/2009    Past Surgical History:  Procedure Laterality Date   ABDOMINAL HYSTERECTOMY     BREAST BIOPSY Right 01/21/2013   stereo - benign   COLONOSCOPY WITH PROPOFOL N/A 06/30/2016   Procedure: COLONOSCOPY WITH PROPOFOL;  Surgeon: Jonathon Bellows, MD;  Location: [facility] ENDOSCOPY;  Service: Endoscopy;  Laterality: N/A;   KNEE SURGERY Left 2012   Dr. Rudene Christians   OOPHORECTOMY     TUBAL  LIGATION     VAGINAL PROLAPSE REPAIR      Family History  Problem Relation Age of Onset   Breast cancer Sister 27       in year 2013   Hyperlipidemia Mother    Diabetes Mother    Cancer Father        Leukemia   Hyperlipidemia Father    Lung cancer Brother    Breast cancer Sister    Heart failure Brother     Social History   Tobacco Use   Smoking status: Former    Packs/day: 0.25    Years: 1.00    Pack years: 0.25    Types: Cigarettes    Start date: 08/13/1967    Quit date: 04/18/1977    Years since quitting: 43.7   Smokeless tobacco: Never   Tobacco comments:    smoking cessation materials not required  Substance Use Topics   Alcohol use: No    Alcohol/week: 0.0 standard drinks     Current Outpatient Medications:    albuterol (VENTOLIN HFA) 108 (90 Base) MCG/ACT inhaler, Inhale 2 puffs into the lungs every 6 (six) hours as needed for wheezing or shortness of breath., Disp: 8 g, Rfl: 6   amLODipine (NORVASC) 10 MG tablet, Take 1 tablet (10 mg total) by mouth daily., Disp: 90 tablet, Rfl: 1   aspirin EC 81 MG tablet, Take 1 tablet (81 mg total) by mouth daily., Disp: 30 tablet, Rfl: 0   Azelastine-Fluticasone 137-50 MCG/ACT SUSP, Place 1 spray into both nostrils 2 (two) times daily., Disp: , Rfl:    Cholecalciferol (VITAMIN D) 2000 UNITS tablet, Take 1 tablet by mouth daily., Disp: , Rfl:    fluticasone-salmeterol (ADVAIR DISKUS) 250-50 MCG/ACT AEPB, Inhale 1 puff into the lungs in the morning and at bedtime., Disp: 60 each, Rfl: 0   loratadine (CLARITIN) 10 MG tablet, Take 1 tablet (10 mg total) by mouth daily., Disp: 30 tablet, Rfl: 0   Magnesium 250 MG TABS, Take 1 tablet by mouth daily., Disp: , Rfl:    metaxalone (SKELAXIN) 800 MG tablet, Take 1 tablet (800 mg total) by mouth 3 (three) times daily as needed. for muscle spams, Disp: 90 tablet, Rfl: 0   montelukast (SINGULAIR) 10 MG tablet, Take 1 tablet (10 mg total) by mouth at bedtime., Disp: 90 tablet, Rfl: 1    Multiple Vitamin (MULTIVITAMIN ADULT PO), Take by mouth., Disp: , Rfl:    rosuvastatin (CRESTOR) 10 MG tablet, Take 1 tablet (10 mg total) by mouth daily., Disp: 90 tablet, Rfl: 1   traMADol (ULTRAM) 50 MG tablet, Take 1 tablet (50 mg total) by mouth 2 (two) times daily as needed., Disp: 60 tablet, Rfl: 2   valsartan-hydrochlorothiazide (DIOVAN-HCT) 160-12.5 MG tablet, Take 1 tablet by mouth daily., Disp: 90 tablet, Rfl: 1   vitamin C (ASCORBIC ACID) 500 MG tablet, Take 1 tablet by mouth daily.,  Disp: , Rfl:    zolpidem (AMBIEN CR) 12.5 MG CR tablet, TAKE 1 TABLET BY MOUTH AT BEDTIME, Disp: 90 tablet, Rfl: 0   tolterodine (DETROL LA) 2 MG 24 hr capsule, Take 1 capsule (2 mg total) by mouth daily. (Patient not taking: Reported on 12/24/2020), Disp: 90 capsule, Rfl: 1  No Known Allergies  I personally reviewed active problem list, medication list, allergies, family history, social history, health maintenance with the patient/caregiver today.   ROS  Constitutional: Negative for fever or weight change.  Respiratory: Negative for cough and shortness of breath.   Cardiovascular: Negative for chest pain or palpitations.  Gastrointestinal: Negative for abdominal pain, no bowel changes.  Musculoskeletal: positive  for gait problem or joint swelling.  Skin: Negative for rash.  Neurological: Negative for dizziness or headache.  No other specific complaints in a complete review of systems (except as listed in HPI above).   Objective  Vitals:   12/24/20 0820  BP: 136/80  Pulse: 87  Resp: 16  Temp: 97.7 F (36.5 C)  SpO2: 97%  Weight: 172 lb (78 kg)  Height: '5\' 2"'$  (1.575 m)    Body mass index is 31.46 kg/m.  Physical Exam  Constitutional: Patient appears well-developed and well-nourished. Obese  No distress.  HEENT: head atraumatic, normocephalic, pupils equal and reactive to light, neck supple Cardiovascular: Normal rate, regular rhythm and normal heart sounds.  No murmur heard. No BLE  edema. Pulmonary/Chest: Effort normal and breath sounds normal. No respiratory distress. Abdominal: Soft.  There is no tenderness. Psychiatric: Patient has a normal mood and affect. behavior is normal. Judgment and thought content normal.  Muscular Skeletal: tender during palpation of left sciatica notch   PHQ2/9: Depression screen [facility] 2/9 12/24/2020 09/17/2020 05/14/2020 04/21/2020 01/09/2020  Decreased Interest 0 0 0 0 0  Down, Depressed, Hopeless 0 0 0 0 0  PHQ - 2 Score 0 0 0 0 0  Altered sleeping - - - - -  Tired, decreased energy - - - - -  Change in appetite - - - - -  Feeling bad or failure about yourself  - - - - -  Trouble concentrating - - - - -  Moving slowly or fidgety/restless - - - - -  Suicidal thoughts - - - - -  PHQ-9 Score - - - - -  Difficult doing work/chores - - - - -  Some recent data might be hidden    phq 9 is negative   Fall Risk: Fall Risk  12/24/2020 09/17/2020 05/14/2020 04/21/2020 01/09/2020  Falls in the past year? 0 0 0 0 0  Number falls in past yr: 0 0 0 0 0  Injury with Fall? 0 0 0 0 0  Risk for fall due to : No Fall Risks - - No Fall Risks -  Risk for fall due to: Comment - - - - -  Follow up Falls prevention discussed - - Falls prevention discussed -      Functional Status Survey: Is the patient deaf or have difficulty hearing?: No Does the patient have difficulty seeing, even when wearing glasses/contacts?: No Does the patient have difficulty concentrating, remembering, or making decisions?: No Does the patient have difficulty walking or climbing stairs?: No Does the patient have difficulty dressing or bathing?: No Does the patient have difficulty doing errands alone such as visiting a doctor's office or shopping?: No    Assessment & Plan  1. Chronic insomnia  - zolpidem (AMBIEN CR) 12.5  MG CR tablet; Take 1 tablet (12.5 mg total) by mouth at bedtime.  Dispense: 90 tablet; Refill: 0  2. Changing sleep-work schedule, affecting sleep  -  zolpidem (AMBIEN CR) 12.5 MG CR tablet; Take 1 tablet (12.5 mg total) by mouth at bedtime.  Dispense: 90 tablet; Refill: 0  3. Need for immunization against influenza  - Flu Vaccine QUAD High Dose(Fluad)  4. Primary osteoarthritis of both knees  - traMADol (ULTRAM) 50 MG tablet; Take 1 tablet (50 mg total) by mouth 2 (two) times daily as needed.  Dispense: 120 tablet; Refill: 0  5. Chronic right-sided low back pain with right-sided sciatica  - Drug Monitor,Opiates,Screen,Urine - traMADol (ULTRAM) 50 MG tablet; Take 1 tablet (50 mg total) by mouth 2 (two) times daily as needed.  Dispense: 120 tablet; Refill: 0  6. Pre-diabetes   7. Hyperglycemia   8. Opioid contract exists  - Drug Monitor,Opiates,Screen,Urine  9. Senile purpura ([place])   10. Moderate persistent asthma without complication   11. Benign essential HTN   12. Seasonal allergic rhinitis, unspecified trigger   13. Chronic cough   14. Controlled substance agreement signed  - Drug Monitor,Opiates,Screen,Urine

## 2020-12-24 ENCOUNTER — Encounter: Payer: Self-pay | Admitting: Family Medicine

## 2020-12-24 ENCOUNTER — Ambulatory Visit (INDEPENDENT_AMBULATORY_CARE_PROVIDER_SITE_OTHER): Payer: BC Managed Care – PPO | Admitting: Family Medicine

## 2020-12-24 ENCOUNTER — Other Ambulatory Visit: Payer: Self-pay

## 2020-12-24 VITALS — BP 136/80 | HR 87 | Temp 97.7°F | Resp 16 | Ht 62.0 in | Wt 172.0 lb

## 2020-12-24 DIAGNOSIS — G8929 Other chronic pain: Secondary | ICD-10-CM | POA: Diagnosis not present

## 2020-12-24 DIAGNOSIS — R69 Illness, unspecified: Secondary | ICD-10-CM | POA: Diagnosis not present

## 2020-12-24 DIAGNOSIS — Z79899 Other long term (current) drug therapy: Secondary | ICD-10-CM

## 2020-12-24 DIAGNOSIS — M5441 Lumbago with sciatica, right side: Secondary | ICD-10-CM

## 2020-12-24 DIAGNOSIS — J302 Other seasonal allergic rhinitis: Secondary | ICD-10-CM

## 2020-12-24 DIAGNOSIS — I10 Essential (primary) hypertension: Secondary | ICD-10-CM

## 2020-12-24 DIAGNOSIS — Z79891 Long term (current) use of opiate analgesic: Secondary | ICD-10-CM

## 2020-12-24 DIAGNOSIS — R053 Chronic cough: Secondary | ICD-10-CM

## 2020-12-24 DIAGNOSIS — F5104 Psychophysiologic insomnia: Secondary | ICD-10-CM | POA: Diagnosis not present

## 2020-12-24 DIAGNOSIS — M170 Bilateral primary osteoarthritis of knee: Secondary | ICD-10-CM | POA: Diagnosis not present

## 2020-12-24 DIAGNOSIS — D692 Other nonthrombocytopenic purpura: Secondary | ICD-10-CM | POA: Diagnosis not present

## 2020-12-24 DIAGNOSIS — Z23 Encounter for immunization: Secondary | ICD-10-CM | POA: Diagnosis not present

## 2020-12-24 DIAGNOSIS — R739 Hyperglycemia, unspecified: Secondary | ICD-10-CM

## 2020-12-24 DIAGNOSIS — R7303 Prediabetes: Secondary | ICD-10-CM | POA: Diagnosis not present

## 2020-12-24 DIAGNOSIS — J4540 Moderate persistent asthma, uncomplicated: Secondary | ICD-10-CM

## 2020-12-24 DIAGNOSIS — G4726 Circadian rhythm sleep disorder, shift work type: Secondary | ICD-10-CM | POA: Diagnosis not present

## 2020-12-24 MED ORDER — TRAMADOL HCL 50 MG PO TABS: 50.0000 mg | ORAL_TABLET | Freq: Two times a day (BID) | ORAL | 0 refills | Status: DC | PRN

## 2020-12-24 MED ORDER — TRAMADOL HCL 50 MG PO TABS: 50.0000 mg | ORAL_TABLET | Freq: Two times a day (BID) | ORAL | 2 refills | Status: DC | PRN

## 2020-12-24 MED ORDER — ZOLPIDEM TARTRATE ER 12.5 MG PO TBCR: 12.5000 mg | EXTENDED_RELEASE_TABLET | Freq: Every day | ORAL | 0 refills | Status: DC

## 2020-12-24 MED ORDER — BENZONATATE 100 MG PO CAPS: 100.0000 mg | ORAL_CAPSULE | Freq: Two times a day (BID) | ORAL | 0 refills | Status: DC | PRN

## 2020-12-25 LAB — DRUG MONITOR,OPIATES,SCREEN,URINE: Opiates: NEGATIVE ng/mL (ref <100)

## 2020-12-25 LAB — DM TEMPLATE

## 2021-01-08 ENCOUNTER — Telehealth: Payer: Self-pay | Admitting: Pulmonary Disease

## 2021-01-08 MED ORDER — AMOXICILLIN-POT CLAVULANATE 875-125 MG PO TABS: 1.0000 | ORAL_TABLET | Freq: Two times a day (BID) | ORAL | 0 refills | Status: DC

## 2021-01-08 MED ORDER — PREDNISONE 10 MG (21) PO TBPK: ORAL_TABLET | ORAL | 0 refills | Status: DC

## 2021-01-08 NOTE — Telephone Encounter (Signed)
Patient is aware of below recommendations and voiced her understanding.  Prednisone and Augmentin has been sent to preferred pharmacy.  Nothing further needed.

## 2021-01-08 NOTE — Telephone Encounter (Signed)
Called and spoke to patient.  C/o wheezing, increasing sob with exertion, nasal congestion and dry cough at times prod with thick clear sputum. Sx have been present for 2 weeks. Denies f/c/s or additional sx.  She is mucinex Q4H, Azelastine-Fluticasone BID, ventolin Q4H and Breo once daily. Vaccinate and boosted against covid.  No recent covid test.  Dr. Patsey Berthold, please advise. Thanks

## 2021-01-08 NOTE — Telephone Encounter (Signed)
Likely flare of chronic sinusitis. Recommend Prednisone taper pack and augmentin 875 mg BID x7 days

## 2021-02-04 DIAGNOSIS — J209 Acute bronchitis, unspecified: Secondary | ICD-10-CM | POA: Diagnosis not present

## 2021-02-04 DIAGNOSIS — J019 Acute sinusitis, unspecified: Secondary | ICD-10-CM | POA: Diagnosis not present

## 2021-02-25 DIAGNOSIS — J019 Acute sinusitis, unspecified: Secondary | ICD-10-CM | POA: Diagnosis not present

## 2021-02-25 DIAGNOSIS — J209 Acute bronchitis, unspecified: Secondary | ICD-10-CM | POA: Diagnosis not present

## 2021-03-16 IMAGING — CR DG CHEST 2V
1 series · 2 of 2 positions shown · non-contrast
Comparison: Chest radiographs, 12/13/2018, CT chest, 02/22/2019

CLINICAL DATA: Wheezing, shortness of breath

EXAM:
CHEST - 2 VIEW

[Series 1: dg chest 2 view · 0.14mm/px · 2 of 2 slices shown]
[im 1/2]
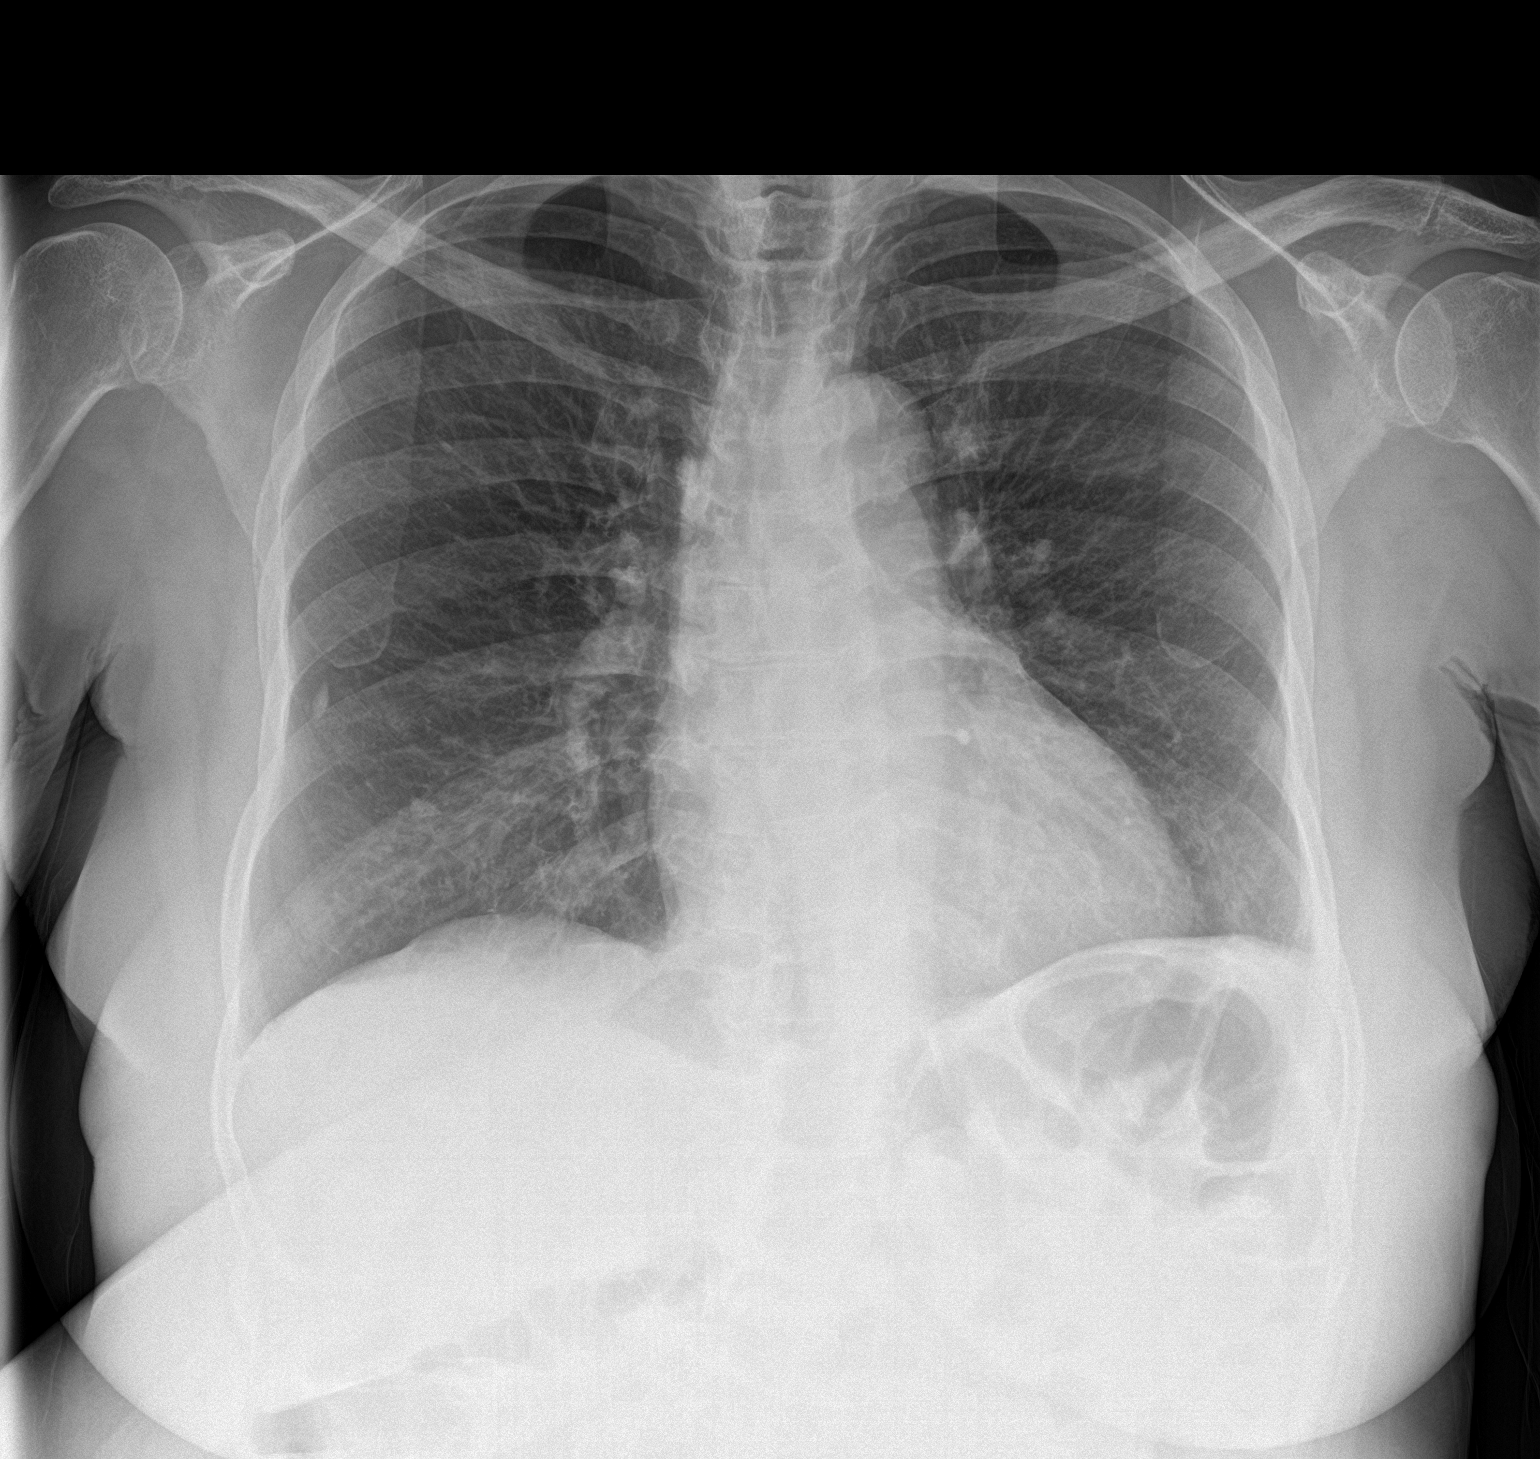
[im 2/2]
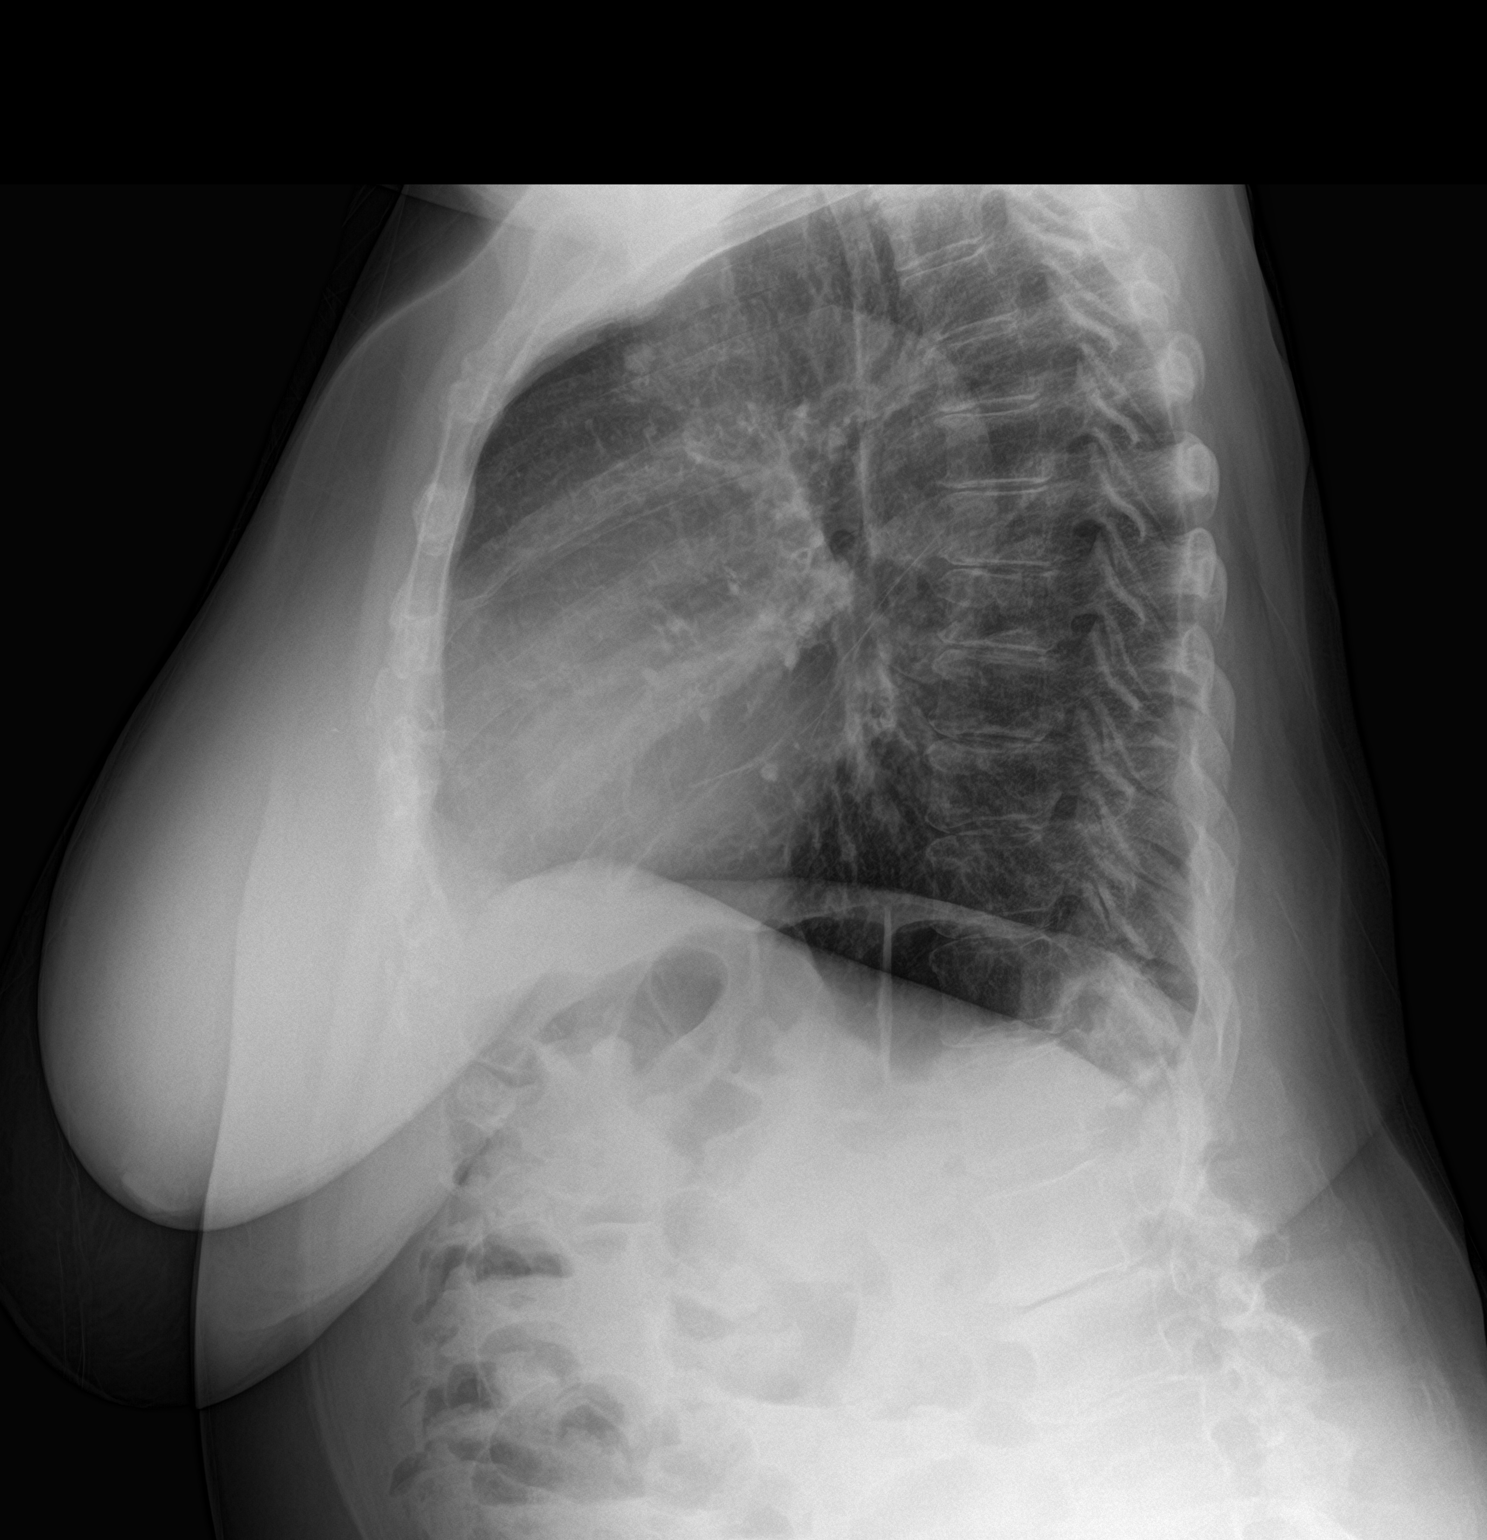

[2 of 2 positions shown; findings below may reference images not displayed]

FINDINGS: The heart size and mediastinal contours are within normal limits.
Similar appearance of mild, diffuse interstitial opacity throughout
the lungs as seen on prior radiographs, with bronchitic change seen
on prior CT. Redemonstrated benign, calcified nodules of the right
midlung. The visualized skeletal structures are unremarkable.
IMPRESSION: Similar appearance of mild, diffuse interstitial opacity throughout
the lungs as seen on prior radiographs, with bronchitic change seen
on comparison CT. No new or acute airspace opacity.

## 2021-03-31 NOTE — Progress Notes (Signed)
Name: ADRYANNA FRIEDT   MRN: [id number]    DOB: Aug 06, 1945   Date:04/01/2021       Progress Note  Subjective  Chief Complaint  Follow up   HPI  Urge incontinence: it is intermittent and takes Detrol prn only, not lately. She thinks symptoms have improved and not currently taking medication    Chronic sinusitis/Ashma : she was seen by Dr. Duwayne Heck 21  - pulmonologist , had a normal spirometry, CT chest  showed some scarring, CT sinus showed chronic sinusitis back in 2020 and was treated but continued to have the cough, and also has post-nasal drainage, SOB . She has taken multiple rounds of Zpack and also one round of Augmentin since the summer, also two rounds of prednisone taper, total rounds of steroids  4 times since Feb 22. She has daily SOB, still works at [employer] but has to walk slowly. She states she is tired of not feeling well. We will reach out to Dr. Patsey Berthold so she can go back for a follow up soon . She states she has been taking Advair as prescribed but currently not taking singulair    Chronic Low Back: she has a long history of low back pain, not secondary to trauma, DDD lumbar spine and also scoliosis. taking Tramadol daily to control symptoms (takes 1 daily, takes 2nd tablets a few times a week), she is also on Skelaxin prn unable to tolerate Flexeril - it makes her sleepy. Pain is constant -pain right now is 3/10 , usually worse at the end of work day - goes up to 6/10  .Pain contract is up to date, urine drug screen done this Summer. 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 She sleeps from around 2 pm-8:30pm for about 5-7 hours when she sleeps during the day Taking Ambien, denies side effects of medication, she is aware of FDA guidelines however unable to sleep with lower dose of Ambien. She only takes medications the nights that she works, reviewed controlled medication database . Unchanged    OA: she has aching  pain on left knee, s/p left knee replaced, only triggered by kneeling down, surgery was done by Dr. Rudene Christians in 2012 and no problems.   Hyperglycemia: last hgbA1C was 6 % denies polyphagia, polydipsia or polyuria.  Continue low carb diet   HTN: taking valsartan hctz, and also Norvasc, she states no longer having dizziness, no chest pain or palpitation . We will send refills to pharmacy today    Dyslipidemia: last LDL stable on Crestor , no myalgia. Last LDL was at goal for her    Grieving: sister got sick back in May 08, 2022, and died 2019/10/28 from cancer, they were best friends and she has been struggling, discussed hospice counseling . She was going to retire last year but decided to work to keep herself busy, she has one sister that lives in [place] but they are not as close. She states she has good days and bad days. She is going to spend Christmas with her sister   Senile Purpura: stable , recurrent on arms, reassurance given . Unchanged    Patient Active Problem List   Diagnosis Date Noted   Senile purpura ([place]) 05/14/2020   Moderate persistent asthma without complication 32/95/1884   Pre-diabetes 05/14/2020   Hyperglycemia 07/13/2017   Chronic constipation 12/15/2016   Primary osteoarthritis of left hip 11/05/2015   Scoliosis 11/05/2015   Chronic low  back pain 10/30/2014   Benign essential HTN 10/27/2014   Bradycardia 10/27/2014   Insomnia 10/27/2014   Headache, temporal 10/27/2014   Hypercholesteremia 10/27/2014   Obesity (BMI 30-39.9) 10/27/2014   Changing sleep-work schedule, affecting sleep 10/27/2014   Vitamin D deficiency 10/27/2014   Arthritis of knee, degenerative 09/24/2009    Past Surgical History:  Procedure Laterality Date   ABDOMINAL HYSTERECTOMY     BREAST BIOPSY Right 01/21/2013   stereo - benign   COLONOSCOPY WITH PROPOFOL N/A 06/30/2016   Procedure: COLONOSCOPY WITH PROPOFOL;  Surgeon: Jonathon Bellows, MD;  Location: [facility] ENDOSCOPY;  Service: Endoscopy;   Laterality: N/A;   KNEE SURGERY Left 2012   Dr. Rudene Christians   OOPHORECTOMY     TUBAL LIGATION     VAGINAL PROLAPSE REPAIR      Family History  Problem Relation Age of Onset   Breast cancer Sister 7       in year 2013   Hyperlipidemia Mother    Diabetes Mother    Cancer Father        Leukemia   Hyperlipidemia Father    Lung cancer Brother    Breast cancer Sister    Heart failure Brother     Social History   Tobacco Use   Smoking status: Former    Packs/day: 0.25    Years: 1.00    Pack years: 0.25    Types: Cigarettes    Start date: 08/13/1967    Quit date: 04/18/1977    Years since quitting: 43.9   Smokeless tobacco: Never   Tobacco comments:    smoking cessation materials not required  Substance Use Topics   Alcohol use: No    Alcohol/week: 0.0 standard drinks     Current Outpatient Medications:    albuterol (VENTOLIN HFA) 108 (90 Base) MCG/ACT inhaler, Inhale 2 puffs into the lungs every 6 (six) hours as needed for wheezing or shortness of breath., Disp: 8 g, Rfl: 6   aspirin EC 81 MG tablet, Take 1 tablet (81 mg total) by mouth daily., Disp: 30 tablet, Rfl: 0   Azelastine-Fluticasone 137-50 MCG/ACT SUSP, Place 1 spray into both nostrils 2 (two) times daily., Disp: , Rfl:    Cholecalciferol (VITAMIN D) 2000 UNITS tablet, Take 1 tablet by mouth daily., Disp: , Rfl:    fluticasone-salmeterol (ADVAIR) 250-50 MCG/ACT AEPB, Inhale 1 puff into the lungs in the morning and at bedtime., Disp: , Rfl:    loratadine (CLARITIN) 10 MG tablet, Take 1 tablet (10 mg total) by mouth daily., Disp: 30 tablet, Rfl: 0   Magnesium 250 MG TABS, Take 1 tablet by mouth daily., Disp: , Rfl:    metaxalone (SKELAXIN) 800 MG tablet, Take 1 tablet (800 mg total) by mouth 3 (three) times daily as needed. for muscle spams, Disp: 90 tablet, Rfl: 0   Multiple Vitamin (MULTIVITAMIN ADULT PO), Take by mouth., Disp: , Rfl:    vitamin C (ASCORBIC ACID) 500 MG tablet, Take 1 tablet by mouth daily., Disp: , Rfl:     amLODipine (NORVASC) 10 MG tablet, Take 1 tablet (10 mg total) by mouth daily., Disp: 90 tablet, Rfl: 1   benzonatate (TESSALON) 100 MG capsule, Take 1-2 capsules (100-200 mg total) by mouth 2 (two) times daily as needed. (Patient not taking: Reported on 04/01/2021), Disp: 90 capsule, Rfl: 0   montelukast (SINGULAIR) 10 MG tablet, Take 1 tablet (10 mg total) by mouth at bedtime., Disp: 90 tablet, Rfl: 1   rosuvastatin (CRESTOR) 10 MG tablet,  Take 1 tablet (10 mg total) by mouth daily., Disp: 90 tablet, Rfl: 1   tolterodine (DETROL LA) 2 MG 24 hr capsule, Take 1 capsule (2 mg total) by mouth daily. (Patient not taking: Reported on 04/01/2021), Disp: 90 capsule, Rfl: 1   traMADol (ULTRAM) 50 MG tablet, Take 1 tablet (50 mg total) by mouth 2 (two) times daily as needed., Disp: 120 tablet, Rfl: 0   valsartan-hydrochlorothiazide (DIOVAN-HCT) 160-12.5 MG tablet, Take 1 tablet by mouth daily., Disp: 90 tablet, Rfl: 1   zolpidem (AMBIEN CR) 12.5 MG CR tablet, Take 1 tablet (12.5 mg total) by mouth at bedtime., Disp: 90 tablet, Rfl: 0  No Known Allergies  I personally reviewed active problem list, medication list, allergies, family history, social history with the patient/caregiver today.   ROS  Constitutional: Negative for fever or weight change.  Respiratory: positive for cough and shortness of breath.   Cardiovascular: Negative for chest pain or palpitations.  Gastrointestinal: Negative for abdominal pain, no bowel changes.  Musculoskeletal: Negative for gait problem or joint swelling.  Skin: Negative for rash.  Neurological: Negative for dizziness or headache.  No other specific complaints in a complete review of systems (except as listed in HPI above).   Objective  Vitals:   04/01/21 1006  BP: 122/84  Pulse: 93  Resp: 18  Temp: 97.8 F (36.6 C)  TempSrc: Oral  SpO2: 95%  Weight: 169 lb 14.4 oz (77.1 kg)  Height: 5\' 2"  (1.575 m)    Body mass index is 31.08 kg/m.  Physical  Exam  Constitutional: Patient appears well-developed and well-nourished. Obese   HEENT: head atraumatic, normocephalic, pupils equal and reactive to light, neck supple Cardiovascular: Normal rate, regular rhythm and normal heart sounds.  No murmur heard. No BLE edema. Pulmonary/Chest: labored breathing, end expiratory wheezing.  Slight tachypnea  Abdominal: Soft.  There is no tenderness. Psychiatric: Patient has a normal mood and affect. behavior is normal. Judgment and thought content normal.   PHQ2/9: Depression screen [facility] 2/9 04/01/2021 12/24/2020 09/17/2020 05/14/2020 04/21/2020  Decreased Interest 0 0 0 0 0  Down, Depressed, Hopeless 0 0 0 0 0  PHQ - 2 Score 0 0 0 0 0  Altered sleeping - - - - -  Tired, decreased energy - - - - -  Change in appetite - - - - -  Feeling bad or failure about yourself  - - - - -  Trouble concentrating - - - - -  Moving slowly or fidgety/restless - - - - -  Suicidal thoughts - - - - -  PHQ-9 Score - - - - -  Difficult doing work/chores - - - - -  Some recent data might be hidden    phq 9 is negative   Fall Risk: Fall Risk  04/01/2021 12/24/2020 09/17/2020 05/14/2020 04/21/2020  Falls in the past year? 0 0 0 0 0  Number falls in past yr: - 0 0 0 0  Injury with Fall? - 0 0 0 0  Risk for fall due to : - No Fall Risks - - No Fall Risks  Risk for fall due to: Comment - - - - -  Follow up Falls prevention discussed Falls prevention discussed - - Falls prevention discussed      Functional Status Survey: Is the patient deaf or have difficulty hearing?: No Does the patient have difficulty seeing, even when wearing glasses/contacts?: No Does the patient have difficulty concentrating, remembering, or making decisions?: No Does the patient  have difficulty walking or climbing stairs?: Yes (shortness of breath) Does the patient have difficulty dressing or bathing?: No Does the patient have difficulty doing errands alone such as visiting a doctor's office or  shopping?: No    Assessment & Plan  1. Moderate persistent asthma without complication  - Ambulatory referral to Pulmonology  2. Benign essential HTN  - amLODipine (NORVASC) 10 MG tablet; Take 1 tablet (10 mg total) by mouth daily.  Dispense: 90 tablet; Refill: 1 - valsartan-hydrochlorothiazide (DIOVAN-HCT) 160-12.5 MG tablet; Take 1 tablet by mouth daily.  Dispense: 90 tablet; Refill: 1  3. Primary osteoarthritis of both knees  - traMADol (ULTRAM) 50 MG tablet; Take 1 tablet (50 mg total) by mouth 2 (two) times daily as needed.  Dispense: 120 tablet; Refill: 0  4. Senile purpura ([place])   5. Seasonal allergic rhinitis, unspecified trigger  - montelukast (SINGULAIR) 10 MG tablet; Take 1 tablet (10 mg total) by mouth at bedtime.  Dispense: 90 tablet; Refill: 1  6. Breast cancer screening by mammogram  - MM Digital Screening; Future  7. Hypercholesteremia  - rosuvastatin (CRESTOR) 10 MG tablet; Take 1 tablet (10 mg total) by mouth daily.  Dispense: 90 tablet; Refill: 1  8. Chronic right-sided low back pain with right-sided sciatica  - traMADol (ULTRAM) 50 MG tablet; Take 1 tablet (50 mg total) by mouth 2 (two) times daily as needed.  Dispense: 120 tablet; Refill: 0  9. Chronic insomnia  - zolpidem (AMBIEN CR) 12.5 MG CR tablet; Take 1 tablet (12.5 mg total) by mouth at bedtime.  Dispense: 90 tablet; Refill: 0

## 2021-04-01 ENCOUNTER — Ambulatory Visit (INDEPENDENT_AMBULATORY_CARE_PROVIDER_SITE_OTHER): Payer: BC Managed Care – PPO | Admitting: Family Medicine

## 2021-04-01 ENCOUNTER — Encounter: Payer: Self-pay | Admitting: Family Medicine

## 2021-04-01 VITALS — BP 122/84 | HR 93 | Temp 97.8°F | Resp 18 | Ht 62.0 in | Wt 169.9 lb

## 2021-04-01 DIAGNOSIS — E7800 Pure hypercholesterolemia, unspecified: Secondary | ICD-10-CM

## 2021-04-01 DIAGNOSIS — M170 Bilateral primary osteoarthritis of knee: Secondary | ICD-10-CM | POA: Diagnosis not present

## 2021-04-01 DIAGNOSIS — J302 Other seasonal allergic rhinitis: Secondary | ICD-10-CM

## 2021-04-01 DIAGNOSIS — M5441 Lumbago with sciatica, right side: Secondary | ICD-10-CM | POA: Diagnosis not present

## 2021-04-01 DIAGNOSIS — G8929 Other chronic pain: Secondary | ICD-10-CM | POA: Diagnosis not present

## 2021-04-01 DIAGNOSIS — R69 Illness, unspecified: Secondary | ICD-10-CM | POA: Diagnosis not present

## 2021-04-01 DIAGNOSIS — Z1231 Encounter for screening mammogram for malignant neoplasm of breast: Secondary | ICD-10-CM

## 2021-04-01 DIAGNOSIS — J4541 Moderate persistent asthma with (acute) exacerbation: Secondary | ICD-10-CM | POA: Diagnosis not present

## 2021-04-01 DIAGNOSIS — D692 Other nonthrombocytopenic purpura: Secondary | ICD-10-CM | POA: Diagnosis not present

## 2021-04-01 DIAGNOSIS — F5104 Psychophysiologic insomnia: Secondary | ICD-10-CM

## 2021-04-01 DIAGNOSIS — I10 Essential (primary) hypertension: Secondary | ICD-10-CM

## 2021-04-01 MED ORDER — MONTELUKAST SODIUM 10 MG PO TABS: 10.0000 mg | ORAL_TABLET | Freq: Every day | ORAL | 1 refills | Status: DC

## 2021-04-01 MED ORDER — TRAMADOL HCL 50 MG PO TABS: 50.0000 mg | ORAL_TABLET | Freq: Two times a day (BID) | ORAL | 0 refills | Status: DC | PRN

## 2021-04-01 MED ORDER — VALSARTAN-HYDROCHLOROTHIAZIDE 160-12.5 MG PO TABS: 1.0000 | ORAL_TABLET | Freq: Every day | ORAL | 1 refills | Status: DC

## 2021-04-01 MED ORDER — METHYLPREDNISOLONE 4 MG PO TBPK: ORAL_TABLET | ORAL | 0 refills | Status: DC

## 2021-04-01 MED ORDER — ROSUVASTATIN CALCIUM 10 MG PO TABS: 10.0000 mg | ORAL_TABLET | Freq: Every day | ORAL | 1 refills | Status: DC

## 2021-04-01 MED ORDER — ZOLPIDEM TARTRATE ER 12.5 MG PO TBCR
12.5000 mg | EXTENDED_RELEASE_TABLET | Freq: Every day | ORAL | 0 refills | Status: DC
Start: 2021-04-01 — End: 2021-07-22

## 2021-04-01 MED ORDER — AMLODIPINE BESYLATE 10 MG PO TABS: 10.0000 mg | ORAL_TABLET | Freq: Every day | ORAL | 1 refills | Status: DC

## 2021-04-05 ENCOUNTER — Telehealth: Payer: Self-pay

## 2021-04-05 NOTE — Telephone Encounter (Signed)
Spoke with patient and let her know we will fill out a placard form for her and mail it to her today. She was pleasant and expressed thankfulness with no further questions or concerns to express at this time.

## 2021-04-05 NOTE — Telephone Encounter (Signed)
Copied from [id number]. Topic: General - Other >> Apr 05, 2021  8:50 AM Valere Dross wrote: Reason for CRM: Pt called in about her handicap sticker and wanted to speak with PCP. Please advise

## 2021-04-11 IMAGING — CR DG SINUSES COMPLETE 3+V
1 series · 4 of 4 positions shown · non-contrast
Comparison: None.

CLINICAL DATA: Chronic sinusitis.

EXAM:
PARANASAL SINUSES - COMPLETE 3 + VIEW

[Series 1: dg sinuses complete · 0.14mm/px · 4 of 4 slices shown]
[im 1/4]
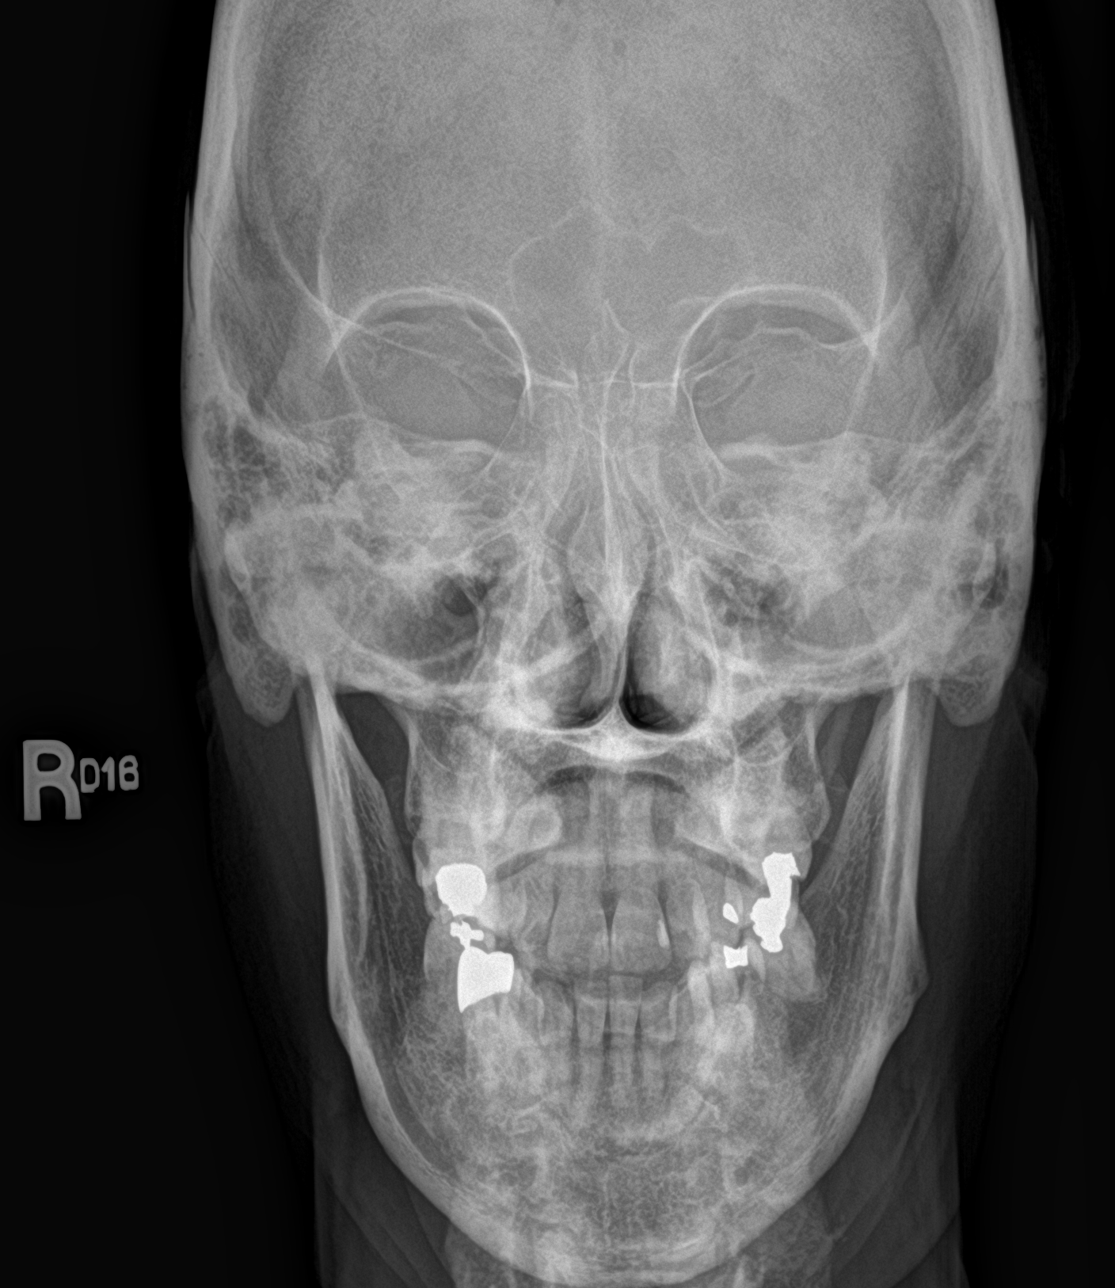
[im 2/4]
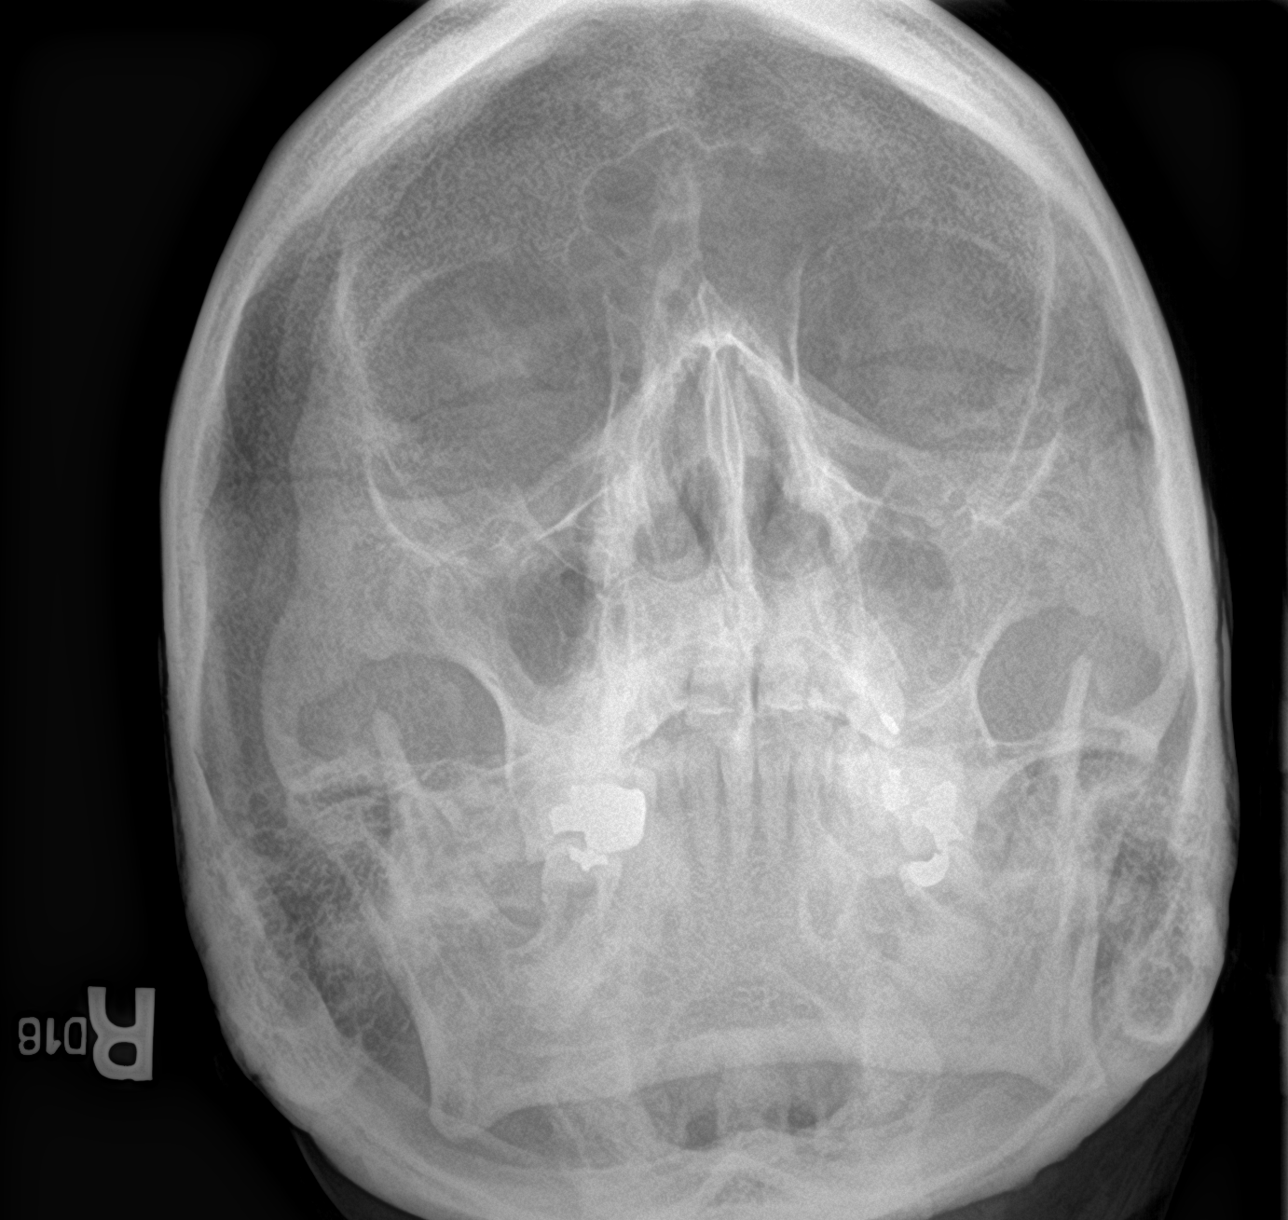
[im 3/4]
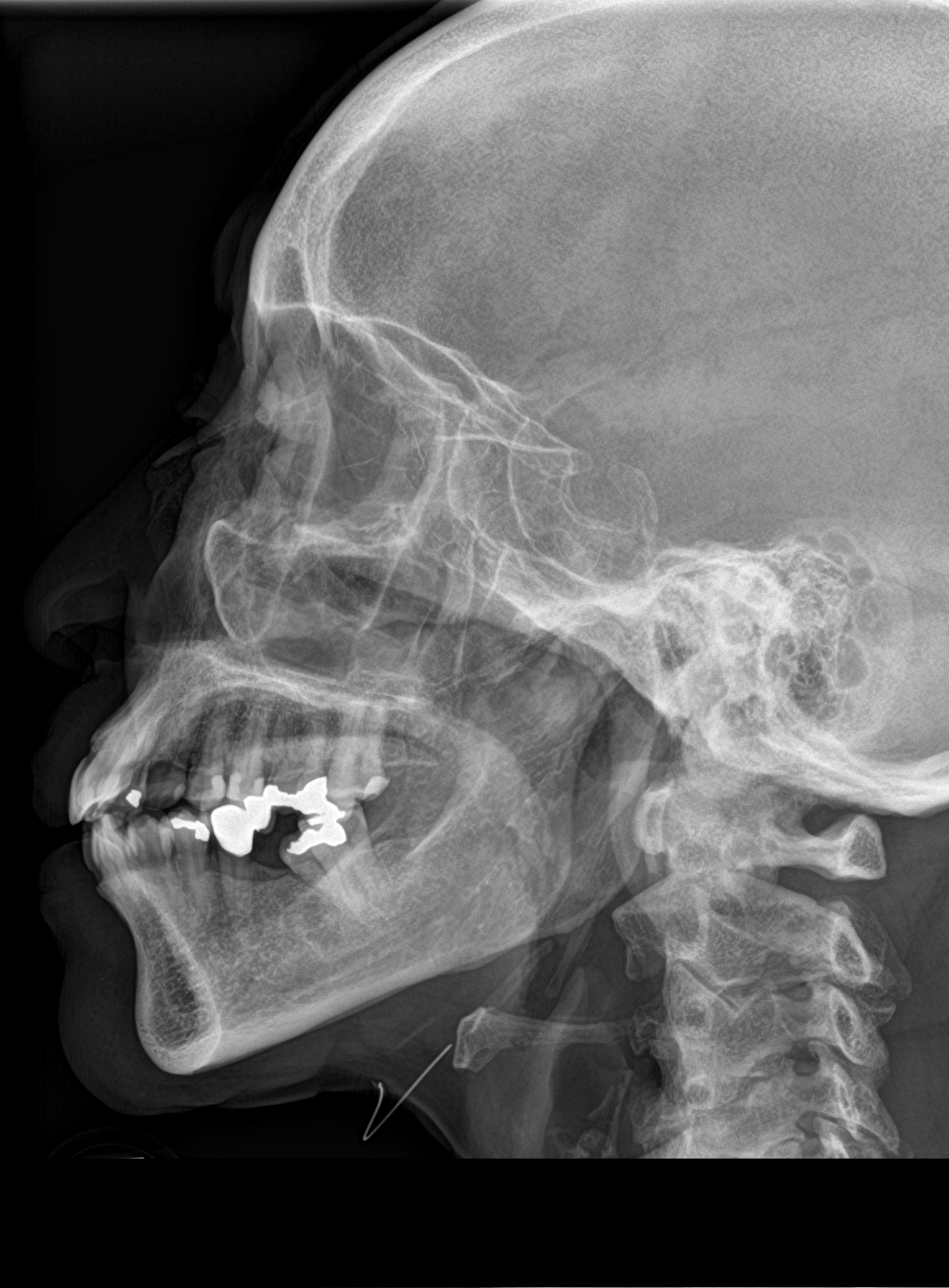
[im 4/4]
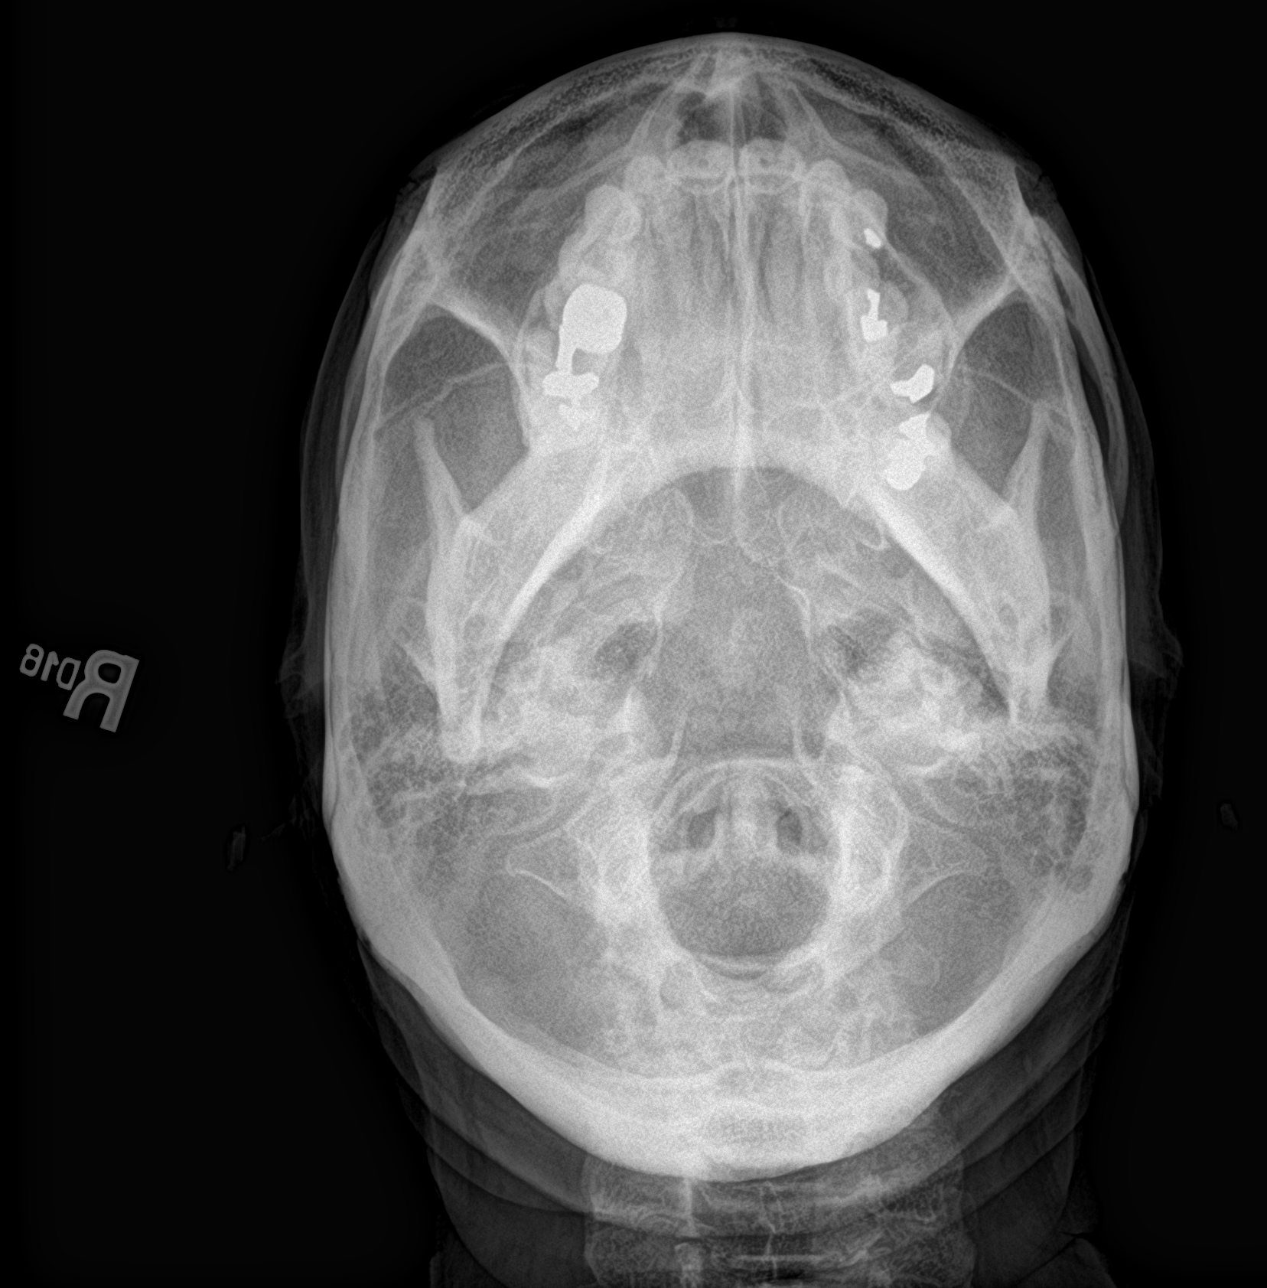

[4 of 4 positions shown; findings below may reference images not displayed]

FINDINGS: Moderate mucosal thickening is seen involving the left maxillary
sinus, with mild mucosal thickening involving the right maxillary
sinus. Visualized frontal and sphenoid sinuses are unremarkable. No
significant bone abnormalities are seen.
IMPRESSION: Bilateral maxillary sinusitis is noted, left greater than right.

## 2021-04-20 ENCOUNTER — Other Ambulatory Visit: Payer: Self-pay | Admitting: Pulmonary Disease

## 2021-04-22 ENCOUNTER — Telehealth: Payer: Self-pay

## 2021-04-22 ENCOUNTER — Ambulatory Visit (INDEPENDENT_AMBULATORY_CARE_PROVIDER_SITE_OTHER): Payer: BC Managed Care – PPO

## 2021-04-22 DIAGNOSIS — Z0000 Encounter for general adult medical examination without abnormal findings: Secondary | ICD-10-CM | POA: Diagnosis not present

## 2021-04-22 DIAGNOSIS — Z780 Asymptomatic menopausal state: Secondary | ICD-10-CM

## 2021-04-22 DIAGNOSIS — Z1211 Encounter for screening for malignant neoplasm of colon: Secondary | ICD-10-CM | POA: Diagnosis not present

## 2021-04-22 NOTE — Progress Notes (Signed)
Subjective:   Hayley Howe is a 76 y.o. female who presents for Medicare Annual (Subsequent) preventive examination.  Virtual Visit via Telephone Note  I connected with  Hayley Howe on 04/22/21 at  8:40 AM EST by telephone and verified that I am speaking with the correct person using two identifiers.  Location: Patient: home Provider: [place] Persons participating in the virtual visit: [place]   I discussed the limitations, risks, security and privacy concerns of performing an evaluation and management service by telephone and the availability of in person appointments. The patient expressed understanding and agreed to proceed.  Interactive audio and video telecommunications were attempted between this nurse and patient, however failed, due to patient having technical difficulties OR patient did not have access to video capability.  We continued and completed visit with audio only.  Some vital signs may be absent or patient reported.   Clemetine Marker, LPN   Review of Systems         Objective:    Today's Vitals   04/22/21 0857  PainSc: 0-No pain   There is no height or weight on file to calculate BMI.  Advanced Directives 04/22/2021 04/21/2020 01/24/2019 01/19/2018 12/15/2016 08/12/2016 06/30/2016  Does Patient Have a Medical Advance Directive? Yes Yes Yes Yes No Yes Yes  Type of Paramedic of [place];Living will [place];Living will [place];Living will [place];Living will - [place];Living will Living will;Healthcare Power of Attorney  Does patient want to make changes to medical advance directive? - - - - - - -  Copy of [place] in Chart? No - copy requested No - copy requested No - copy requested No - copy requested - No - copy requested No - copy requested    Current Medications (verified) Outpatient Encounter Medications as  of 04/22/2021  Medication Sig   albuterol (VENTOLIN HFA) 108 (90 Base) MCG/ACT inhaler Inhale 2 puffs into the lungs every 6 (six) hours as needed for wheezing or shortness of breath.   amLODipine (NORVASC) 10 MG tablet Take 1 tablet (10 mg total) by mouth daily.   aspirin EC 81 MG tablet Take 1 tablet (81 mg total) by mouth daily.   Azelastine-Fluticasone 137-50 MCG/ACT SUSP Place 1 spray into both nostrils 2 (two) times daily.   Cholecalciferol (VITAMIN D) 2000 UNITS tablet Take 1 tablet by mouth daily.   fluticasone-salmeterol (ADVAIR) 250-50 MCG/ACT AEPB INHALE 1 DOSE BY MOUTH IN THE MORNING AND AT BEDTIME   loratadine (CLARITIN) 10 MG tablet Take 1 tablet (10 mg total) by mouth daily.   Magnesium 250 MG TABS Take 1 tablet by mouth daily.   metaxalone (SKELAXIN) 800 MG tablet Take 1 tablet (800 mg total) by mouth 3 (three) times daily as needed. for muscle spams   montelukast (SINGULAIR) 10 MG tablet Take 1 tablet (10 mg total) by mouth at bedtime.   Multiple Vitamin (MULTIVITAMIN ADULT PO) Take by mouth.   rosuvastatin (CRESTOR) 10 MG tablet Take 1 tablet (10 mg total) by mouth daily.   tolterodine (DETROL LA) 2 MG 24 hr capsule Take 1 capsule (2 mg total) by mouth daily.   traMADol (ULTRAM) 50 MG tablet Take 1 tablet (50 mg total) by mouth 2 (two) times daily as needed.   valsartan-hydrochlorothiazide (DIOVAN-HCT) 160-12.5 MG tablet Take 1 tablet by mouth daily.   vitamin C (ASCORBIC ACID) 500 MG tablet Take 1 tablet by mouth daily.  zolpidem (AMBIEN CR) 12.5 MG CR tablet Take 1 tablet (12.5 mg total) by mouth at bedtime.   benzonatate (TESSALON) 100 MG capsule Take 1-2 capsules (100-200 mg total) by mouth 2 (two) times daily as needed. (Patient not taking: Reported on 04/01/2021)   [DISCONTINUED] methylPREDNISolone (MEDROL DOSEPAK) 4 MG TBPK tablet Take as directed   No facility-administered encounter medications on file as of 04/22/2021.    Allergies (verified) Patient has no known  allergies.   History: Past Medical History:  Diagnosis Date   Hyperlipidemia    Hypertension    Insomnia    Low back pain    Obesity    Osteoarthritis of left knee    Past Surgical History:  Procedure Laterality Date   ABDOMINAL HYSTERECTOMY     BREAST BIOPSY Right 01/21/2013   stereo - benign   COLONOSCOPY WITH PROPOFOL N/A 06/30/2016   Procedure: COLONOSCOPY WITH PROPOFOL;  Surgeon: Jonathon Bellows, MD;  Location: [facility] ENDOSCOPY;  Service: Endoscopy;  Laterality: N/A;   KNEE SURGERY Left 2012   Dr. Rudene Christians   OOPHORECTOMY     TUBAL LIGATION     VAGINAL PROLAPSE REPAIR     Family History  Problem Relation Age of Onset   Breast cancer Sister 37       in year 2013   Hyperlipidemia Mother    Diabetes Mother    Cancer Father        Leukemia   Hyperlipidemia Father    Lung cancer Brother    Breast cancer Sister    Heart failure Brother    Social History   Socioeconomic History   Marital status: Widowed    Spouse name: Not on file   Number of children: 2   Years of education: Not on file   Highest education level: 12th grade  Occupational History   Occupation: Education officer, museum: [email or web address]    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  Tobacco Use   Smoking status: Former    Packs/day: 0.25    Years: 1.00    Pack years: 0.25    Types: Cigarettes    Start date: 08/13/1967    Quit date: 04/18/1977    Years since quitting: 44.0   Smokeless tobacco: Never   Tobacco comments:    smoking cessation materials not required  Vaping Use   Vaping Use: Never used  Substance and Sexual Activity   Alcohol use: No    Alcohol/week: 0.0 standard drinks   Drug use: No   Sexual activity: Not Currently  Other Topics Concern   Not on file  Social History Narrative   Daughter lives with her and grandson   Still working full time at [employer] 3rd shift      Mother of [place] Determinants of Radio broadcast assistant Strain: Low Risk    Difficulty of Paying Living Expenses: Not hard at all   Food Insecurity: No Food Insecurity   Worried About Charity fundraiser in the Last Year: Never true   Arboriculturist in the Last Year: Never true  Transportation Needs: No Transportation Needs   Lack of Transportation (Medical): No   Lack of Transportation (Non-Medical): No  Physical Activity: Inactive   Days of Exercise per Week: 0 days   Minutes of Exercise per Session: 0 min  Stress: No Stress Concern Present   Feeling of Stress : Not at all  Social Connections: Moderately Integrated   Frequency of Communication with Friends and  Family: More than three times a week   Frequency of Social Gatherings with Friends and Family: Twice a week   Attends Religious Services: More than 4 times per year   Active Member of [employer] or Organizations: Yes   Attends Archivist Meetings: More than 4 times per year   Marital Status: Widowed    Tobacco Counseling Counseling given: Not Answered Tobacco comments: smoking cessation materials not required   Clinical Intake:  Pre-visit preparation completed: Yes  Pain : No/denies pain Pain Score: 0-No pain     Nutritional Risks: None Diabetes: No  How often do you need to have someone help you when you read instructions, pamphlets, or other written materials from your doctor or pharmacy?: 1 - Never    Interpreter Needed?: No  Information entered by :: Clemetine Marker LPN   Activities of Daily Living In your present state of health, do you have any difficulty performing the following activities: 04/22/2021 04/01/2021  Hearing? N N  Vision? N N  Difficulty concentrating or making decisions? N N  Walking or climbing stairs? Y Y  Comment - shortness of breath  Dressing or bathing? N N  Doing errands, shopping? N N  Preparing Food and eating ? N -  Using the Toilet? N -  In the past six months, have you accidently leaked urine? N -  Do you have problems with loss of bowel control? N -  Managing your Medications? N -  Managing  your Finances? N -  Housekeeping or managing your Housekeeping? N -  Some recent data might be hidden    Patient Care Team: Steele Sizer, MD as PCP - General (Family Medicine) Grant Fontana, [place] as Consulting Physician (Chiropractic Medicine) Beverly Gust, MD as Consulting Physician (Otolaryngology) Jonathon Bellows, MD as Consulting Physician (Gastroenterology) Tyler Pita, MD as Consulting Physician (Pulmonary Disease)  Indicate any recent Medical Services you may have received from other than [facility] providers in the past year (date may be approximate).     Assessment:   This is a routine wellness examination for Pauline.  Hearing/Vision screen Hearing Screening - Comments:: Pt denies hearing difficulty Vision Screening - Comments:: Vision screenings done at [facility] in [place] issues and exercise activities discussed: Current Exercise Habits: The patient has a physically strenuous job, but has no regular exercise apart from work., Exercise limited by: respiratory conditions(s)   Goals Addressed             This Visit's Progress    DIET - INCREASE WATER INTAKE   On track    Recommend drinking 6-8 glasses of water per day        Depression Screen PHQ 2/9 Scores 04/22/2021 04/01/2021 12/24/2020 09/17/2020 05/14/2020 04/21/2020 01/09/2020  PHQ - 2 Score 0 0 0 0 0 0 0  PHQ- 9 Score - - - - - - -    Fall Risk Fall Risk  04/22/2021 04/01/2021 12/24/2020 09/17/2020 05/14/2020  Falls in the past year? 0 0 0 0 0  Number falls in past yr: 0 - 0 0 0  Injury with Fall? 0 - 0 0 0  Risk for fall due to : No Fall Risks - No Fall Risks - -  Risk for fall due to: Comment - - - - -  Follow up Falls prevention discussed Falls prevention discussed Falls prevention discussed - -    FALL RISK PREVENTION PERTAINING TO THE HOME:  Any stairs in or around the home? Yes  If so, are there any without handrails? No  Home free of loose throw rugs in walkways, pet beds, electrical  cords, etc? Yes  Adequate lighting in your home to reduce risk of falls? Yes   ASSISTIVE DEVICES UTILIZED TO PREVENT FALLS:  Life alert? No  Use of a cane, walker or w/c? No  Grab bars in the bathroom? Yes  Shower chair or bench in shower? No  Elevated toilet seat or a handicapped toilet? Yes   TIMED UP AND GO:  Was the test performed? No . Telephonic visit.   Cognitive Function: Normal cognitive status assessed by direct observation by this Nurse Health Advisor. No abnormalities found.        6CIT Screen 01/24/2019 01/19/2018  What Year? 0 points 0 points  What month? 0 points 0 points  What time? 0 points 0 points  Count back from 20 0 points 0 points  Months in reverse 0 points 2 points  Repeat phrase 0 points 4 points  Total Score 0 6    Immunizations Immunization History  Administered Date(s) Administered   Fluad Quad(high Dose 65+) 12/13/2018, 01/09/2020, 12/24/2020   Influenza, High Dose Seasonal PF 01/20/2016, 12/15/2016, 01/19/2018   Influenza, Seasonal, Injecte, Preservative Fre 02/18/2010, 02/11/2011, 03/29/2012   Influenza,inj,Quad PF,6+ Mos 01/03/2013, 01/10/2014, 03/05/2015   Influenza-Unspecified 12/17/2013   Moderna Sars-Covid-2 Vaccination 05/26/2019, 06/26/2019, 02/20/2020   Pneumococcal Conjugate-13 06/19/2014   Pneumococcal Polysaccharide-23 02/18/2010, 07/02/2015   Td 08/12/2016   Tdap 09/14/2007   Zoster, Live 08/20/2015    TDAP status: Up to date  Flu Vaccine status: Up to date  Pneumococcal vaccine status: Up to date  Covid-19 vaccine status: Completed vaccines  Qualifies for Shingles Vaccine? Yes   Zostavax completed: Yes  Shingrix Completed?: No.    Education has been provided regarding the importance of this vaccine. Patient has been advised to call insurance company to determine out of pocket expense if they have not yet received this vaccine. Advised may also receive vaccine at local pharmacy or Health Dept. Verbalized acceptance and  understanding.  Screening Tests Health Maintenance  Topic Date Due   MAMMOGRAM  03/23/2019   COVID-19 Vaccine (4 - Booster for Moderna series) 04/16/2020   Zoster Vaccines- Shingrix (1 of 2) 06/30/2021 (Originally 07/26/1964)   COLONOSCOPY (Pts 45-85yrs Insurance coverage will need to be confirmed)  06/30/2021   TETANUS/TDAP  08/13/2026   Pneumonia Vaccine 6+ Years old  Completed   INFLUENZA VACCINE  Completed   DEXA SCAN  Completed   Hepatitis C Screening  Completed   HPV VACCINES  Aged Out    Health Maintenance  Health Maintenance Due  Topic Date Due   MAMMOGRAM  03/23/2019   COVID-19 Vaccine (4 - Booster for Moderna series) 04/16/2020    Colorectal cancer screening: Type of screening: Colonoscopy. Completed 06/30/16. Repeat every 5 years  Mammogram status: Completed 03/22/18. Repeat every year. Ordered 04/01/21.   Bone Density status: Completed 03/22/18. Results reflect: Bone density results: NORMAL. Repeat every 2 years. Ordered today.   Lung Cancer Screening: (Low Dose CT Chest recommended if Age 34-80 years, 30 pack-year currently smoking OR have quit w/in 15years.) does not qualify.   Additional Screening:  Hepatitis C Screening: does qualify; Completed 03/29/12  Vision Screening: Recommended annual ophthalmology exams for early detection of glaucoma and other disorders of the eye. Is the patient up to date with their annual eye exam?  Yes  Who is the provider or what is the name of the office in  which the patient attends annual eye exams? [place].   Dental Screening: Recommended annual dental exams for proper oral hygiene  Community Resource Referral / Chronic Care Management: CRR required this visit?  No   CCM required this visit?  No      Plan:     I have personally reviewed and noted the following in the patients chart:   Medical and social history Use of alcohol, tobacco or illicit drugs  Current medications and supplements including  opioid prescriptions.  Functional ability and status Nutritional status Physical activity Advanced directives List of other physicians Hospitalizations, surgeries, and ER visits in previous 12 months Vitals Screenings to include cognitive, depression, and falls Referrals and appointments  In addition, I have reviewed and discussed with patient certain preventive protocols, quality metrics, and best practice recommendations. A written personalized care plan for preventive services as well as general preventive health recommendations were provided to patient.     Clemetine Marker, LPN   1/[id number]/2585   Nurse Notes: Pt c/o cough and cold sxs starting this week; pt does have chronic cough and asthma; denies fever or other symptoms. Advised to contact office if sxs worsen or persist

## 2021-04-22 NOTE — Telephone Encounter (Signed)
CALLED PATIENT NO ANSWER LEFT VOICEMAIL FOR A CALL BACK ? ?

## 2021-04-22 NOTE — Patient Instructions (Signed)
Hayley Howe , Thank you for taking time to come for your Medicare Wellness Visit. I appreciate your ongoing commitment to your health goals. Please review the following plan we discussed and let me know if I can assist you in the future.   Screening recommendations/referrals: Colonoscopy: done 06/30/16. Referral sent to [facility] today for repeat screening colonoscopy. They will contact you for an appointment.  Mammogram: done 03/22/18. Please call [phone] to schedule your mammogram and bone density screening.  Bone Density: done 03/22/18 Recommended yearly ophthalmology/optometry visit for glaucoma screening and checkup Recommended yearly dental visit for hygiene and checkup  Vaccinations: Influenza vaccine: done 12/24/20 Pneumococcal vaccine: done 07/02/15 Tdap vaccine: done 08/12/16 Shingles vaccine: Shingrix discussed. Please contact your pharmacy for coverage information.  Covid-19: done 05/26/19, 06/26/19 & 02/20/20  Advanced directives: Please bring a copy of your health care power of attorney and living will to the office at your convenience.   Conditions/risks identified: Recommend increasing physical activity as tolerated  Next appointment: Follow up in one year for your annual wellness visit    Preventive Care 65 Years and Older, Female Preventive care refers to lifestyle choices and visits with your health care provider that can promote health and wellness. What does preventive care include? A yearly physical exam. This is also called an annual well check. Dental exams once or twice a year. Routine eye exams. Ask your health care provider how often you should have your eyes checked. Personal lifestyle choices, including: Daily care of your teeth and gums. Regular physical activity. Eating a healthy diet. Avoiding tobacco and drug use. Limiting alcohol use. Practicing safe sex. Taking low-dose aspirin every day. Taking vitamin and mineral supplements as  recommended by your health care provider. What happens during an annual well check? The services and screenings done by your health care provider during your annual well check will depend on your age, overall health, lifestyle risk factors, and family history of disease. Counseling  Your health care provider may ask you questions about your: Alcohol use. Tobacco use. Drug use. Emotional well-being. Home and relationship well-being. Sexual activity. Eating habits. History of falls. Memory and ability to understand (cognition). Work and work Statistician. Reproductive health. Screening  You may have the following tests or measurements: Height, weight, and BMI. Blood pressure. Lipid and cholesterol levels. These may be checked every 5 years, or more frequently if you are over 70 years old. Skin check. Lung cancer screening. You may have this screening every year starting at age 74 if you have a 30-pack-year history of smoking and currently smoke or have quit within the past 15 years. Fecal occult blood test (FOBT) of the stool. You may have this test every year starting at age 74. Flexible sigmoidoscopy or colonoscopy. You may have a sigmoidoscopy every 5 years or a colonoscopy every 10 years starting at age 74. Hepatitis C blood test. Hepatitis B blood test. Sexually transmitted disease (STD) testing. Diabetes screening. This is done by checking your blood sugar (glucose) after you have not eaten for a while (fasting). You may have this done every 1-3 years. Bone density scan. This is done to screen for osteoporosis. You may have this done starting at age 74. Mammogram. This may be done every 1-2 years. Talk to your health care provider about how often you should have regular mammograms. Talk with your health care provider about your test results, treatment options, and if necessary, the need for more tests. Vaccines  Your health care provider may  recommend certain vaccines, such  as: Influenza vaccine. This is recommended every year. Tetanus, diphtheria, and acellular pertussis (Tdap, Td) vaccine. You may need a Td booster every 10 years. Zoster vaccine. You may need this after age 74. Pneumococcal 13-valent conjugate (PCV13) vaccine. One dose is recommended after age 74. Pneumococcal polysaccharide (PPSV23) vaccine. One dose is recommended after age 74. Talk to your health care provider about which screenings and vaccines you need and how often you need them. This information is not intended to replace advice given to you by your health care provider. Make sure you discuss any questions you have with your health care provider. Document Released: 05/01/2015 Document Revised: 12/23/2015 Document Reviewed: 02/03/2015 Elsevier Interactive Patient Education  2017 [place] Prevention in the Home Falls can cause injuries. They can happen to people of all ages. There are many things you can do to make your home safe and to help prevent falls. What can I do on the outside of my home? Regularly fix the edges of walkways and driveways and fix any cracks. Remove anything that might make you trip as you walk through a door, such as a raised step or threshold. Trim any bushes or trees on the path to your home. Use bright outdoor lighting. Clear any walking paths of anything that might make someone trip, such as rocks or tools. Regularly check to see if handrails are loose or broken. Make sure that both sides of any steps have handrails. Any raised decks and porches should have guardrails on the edges. Have any leaves, snow, or ice cleared regularly. Use sand or salt on walking paths during winter. Clean up any spills in your garage right away. This includes oil or grease spills. What can I do in the bathroom? Use night lights. Install grab bars by the toilet and in the tub and shower. Do not use towel bars as grab bars. Use non-skid mats or decals in the tub or  shower. If you need to sit down in the shower, use a plastic, non-slip stool. Keep the floor dry. Clean up any water that spills on the floor as soon as it happens. Remove soap buildup in the tub or shower regularly. Attach bath mats securely with double-sided non-slip rug tape. Do not have throw rugs and other things on the floor that can make you trip. What can I do in the bedroom? Use night lights. Make sure that you have a light by your bed that is easy to reach. Do not use any sheets or blankets that are too big for your bed. They should not hang down onto the floor. Have a firm chair that has side arms. You can use this for support while you get dressed. Do not have throw rugs and other things on the floor that can make you trip. What can I do in the kitchen? Clean up any spills right away. Avoid walking on wet floors. Keep items that you use a lot in easy-to-reach places. If you need to reach something above you, use a strong step stool that has a grab bar. Keep electrical cords out of the way. Do not use floor polish or wax that makes floors slippery. If you must use wax, use non-skid floor wax. Do not have throw rugs and other things on the floor that can make you trip. What can I do with my stairs? Do not leave any items on the stairs. Make sure that there are handrails on both sides of  the stairs and use them. Fix handrails that are broken or loose. Make sure that handrails are as long as the stairways. Check any carpeting to make sure that it is firmly attached to the stairs. Fix any carpet that is loose or worn. Avoid having throw rugs at the top or bottom of the stairs. If you do have throw rugs, attach them to the floor with carpet tape. Make sure that you have a light switch at the top of the stairs and the bottom of the stairs. If you do not have them, ask someone to add them for you. What else can I do to help prevent falls? Wear shoes that: Do not have high heels. Have  rubber bottoms. Are comfortable and fit you well. Are closed at the toe. Do not wear sandals. If you use a stepladder: Make sure that it is fully opened. Do not climb a closed stepladder. Make sure that both sides of the stepladder are locked into place. Ask someone to hold it for you, if possible. Clearly mark and make sure that you can see: Any grab bars or handrails. First and last steps. Where the edge of each step is. Use tools that help you move around (mobility aids) if they are needed. These include: Canes. Walkers. Scooters. Crutches. Turn on the lights when you go into a dark area. Replace any light bulbs as soon as they burn out. Set up your furniture so you have a clear path. Avoid moving your furniture around. If any of your floors are uneven, fix them. If there are any pets around you, be aware of where they are. Review your medicines with your doctor. Some medicines can make you feel dizzy. This can increase your chance of falling. Ask your doctor what other things that you can do to help prevent falls. This information is not intended to replace advice given to you by your health care provider. Make sure you discuss any questions you have with your health care provider. Document Released: 01/29/2009 Document Revised: 09/10/2015 Document Reviewed: 05/09/2014 Elsevier Interactive Patient Education  2017 [employer].

## 2021-04-23 ENCOUNTER — Telehealth: Payer: Self-pay

## 2021-04-23 NOTE — Telephone Encounter (Signed)
CALLED PATIENT NO ANSWER LEFT VOICEMAIL FOR A CALL BACK ? ?

## 2021-04-26 ENCOUNTER — Other Ambulatory Visit: Payer: Self-pay

## 2021-04-26 ENCOUNTER — Ambulatory Visit (INDEPENDENT_AMBULATORY_CARE_PROVIDER_SITE_OTHER): Payer: BC Managed Care – PPO | Admitting: Pulmonary Disease

## 2021-04-26 ENCOUNTER — Encounter: Payer: Self-pay | Admitting: Pulmonary Disease

## 2021-04-26 ENCOUNTER — Other Ambulatory Visit
Admission: RE | Admit: 2021-04-26 | Discharge: 2021-04-26 | Disposition: A | Discharge status: Home / Self Care | Payer: BC Managed Care – PPO | Source: Ambulatory Visit | Attending: Pulmonary Disease | Admitting: Pulmonary Disease

## 2021-04-26 ENCOUNTER — Telehealth: Payer: Self-pay

## 2021-04-26 VITALS — BP 124/82 | HR 97 | Temp 97.9°F | Ht 64.0 in | Wt 173.6 lb

## 2021-04-26 DIAGNOSIS — J324 Chronic pansinusitis: Secondary | ICD-10-CM

## 2021-04-26 DIAGNOSIS — J4541 Moderate persistent asthma with (acute) exacerbation: Secondary | ICD-10-CM

## 2021-04-26 LAB — CBC WITH DIFFERENTIAL/PLATELET
Abs Immature Granulocytes: 0.01 10*3/uL (ref 0.00–0.07)
Basophils Absolute: 0.1 10*3/uL (ref 0.0–0.1)
Basophils Relative: 1 %
Eosinophils Absolute: 0.5 10*3/uL (ref 0.0–0.5)
Eosinophils Relative: 6 %
HCT: 35.7 % — ABNORMAL LOW (ref 36.0–46.0)
Hemoglobin: 12 g/dL (ref 12.0–15.0)
Immature Granulocytes: 0 %
Lymphocytes Relative: 26 %
Lymphs Abs: 2.1 10*3/uL (ref 0.7–4.0)
MCH: 27.7 pg (ref 26.0–34.0)
MCHC: 33.6 g/dL (ref 30.0–36.0)
MCV: 82.4 fL (ref 80.0–100.0)
Monocytes Absolute: 0.5 10*3/uL (ref 0.1–1.0)
Monocytes Relative: 6 %
Neutro Abs: 5.1 10*3/uL (ref 1.7–7.7)
Neutrophils Relative %: 61 %
Platelets: 228 10*3/uL (ref 150–400)
RBC: 4.33 MIL/uL (ref 3.87–5.11)
RDW: 15.6 % — ABNORMAL HIGH (ref 11.5–15.5)
WBC: 8.2 10*3/uL (ref 4.0–10.5)
nRBC: 0 % (ref 0.0–0.2)

## 2021-04-26 MED ORDER — AZITHROMYCIN 250 MG PO TABS: ORAL_TABLET | ORAL | 0 refills | Status: AC

## 2021-04-26 MED ORDER — METHYLPREDNISOLONE 4 MG PO TBPK
ORAL_TABLET | ORAL | 0 refills | Status: DC
Start: 2021-04-26 — End: 2021-05-31

## 2021-04-26 MED ORDER — TRELEGY ELLIPTA 200-62.5-25 MCG/ACT IN AEPB: 1.0000 | INHALATION_SPRAY | Freq: Every day | RESPIRATORY_TRACT | 0 refills | Status: DC

## 2021-04-26 NOTE — Telephone Encounter (Signed)
CALLED PATIENT NO ANSWER LEFT VOICEMAIL FOR A CALL BACK LETTER SENT 

## 2021-04-26 NOTE — Progress Notes (Signed)
Subjective:    Patient ID: Hayley Howe, female    DOB: 12-11-1945, 76 y.o.   MRN: [id number] Chief Complaint  Patient presents with   Follow-up    Sob with exertion, prod cough with clear sputum and wheezing.     HPI Patient is a 76 year old remote former smoker with a history of severe pansinusitis followed by Dr. Tami Ribas who has moderate persistent asthma and presents today for evaluation of increasing shortness of breath over the last week.  She was last seen here on 02 March 2020 and failed to follow-up as instructed.  In August 2022 insurance was not covering her Memory Dance and this was switched to Advair and the patient was instructed to make an appointment for follow-up.  She did not follow-up as instructed.  She has noted that since switching to Advair she has had more issues with her shortness of breath.  She has not had any fevers, chills or sweats.  She has not had any hemoptysis.  Sputum is yellow to pale green.  She has chronic sinus issues which usually trigger her chest symptoms.  She has had increased wheezing and has had to increase her use of rescue albuterol to at least 4 times a day.  She does not endorse any other symptomatology no other complaint.   Review of Systems A 10 point review of systems was performed and it is as noted above otherwise negative.  Patient Active Problem List   Diagnosis Date Noted   Senile purpura ([place]) 05/14/2020   Moderate persistent asthma without complication 46/96/2952   Pre-diabetes 05/14/2020   Hyperglycemia 07/13/2017   Chronic constipation 12/15/2016   Primary osteoarthritis of left hip 11/05/2015   Scoliosis 11/05/2015   Chronic low back pain 10/30/2014   Benign essential HTN 10/27/2014   Bradycardia 10/27/2014   Insomnia 10/27/2014   Headache, temporal 10/27/2014   Hypercholesteremia 10/27/2014   Obesity (BMI 30-39.9) 10/27/2014   Changing sleep-work schedule, affecting sleep 10/27/2014   Vitamin D deficiency 10/27/2014    Arthritis of knee, degenerative 09/24/2009   Social History   Tobacco Use   Smoking status: Former    Packs/day: 0.25    Years: 1.00    Pack years: 0.25    Types: Cigarettes    Start date: 08/13/1967    Quit date: 04/18/1977    Years since quitting: 44.0   Smokeless tobacco: Never   Tobacco comments:    smoking cessation materials not required  Substance Use Topics   Alcohol use: No    Alcohol/week: 0.0 standard drinks   No Known Allergies Current Meds  Medication Sig   albuterol (VENTOLIN HFA) 108 (90 Base) MCG/ACT inhaler Inhale 2 puffs into the lungs every 6 (six) hours as needed for wheezing or shortness of breath.   amLODipine (NORVASC) 10 MG tablet Take 1 tablet (10 mg total) by mouth daily.   aspirin EC 81 MG tablet Take 1 tablet (81 mg total) by mouth daily.   Azelastine-Fluticasone 137-50 MCG/ACT SUSP Place 1 spray into both nostrils 2 (two) times daily.   azithromycin (ZITHROMAX) 250 MG tablet Take 2 tablets (500 mg) on  Day 1,  followed by 1 tablet (250 mg) once daily on Days 2 through 5.   benzonatate (TESSALON) 100 MG capsule Take 1-2 capsules (100-200 mg total) by mouth 2 (two) times daily as needed.   Cholecalciferol (VITAMIN D) 2000 UNITS tablet Take 1 tablet by mouth daily.   Fluticasone-Umeclidin-Vilant (TRELEGY ELLIPTA) 200-62.5-25 MCG/ACT AEPB Inhale 1 puff  into the lungs daily.   loratadine (CLARITIN) 10 MG tablet Take 1 tablet (10 mg total) by mouth daily.   Magnesium 250 MG TABS Take 1 tablet by mouth daily.   metaxalone (SKELAXIN) 800 MG tablet Take 1 tablet (800 mg total) by mouth 3 (three) times daily as needed. for muscle spams   methylPREDNISolone (MEDROL DOSEPAK) 4 MG TBPK tablet Take as directed in the package.   montelukast (SINGULAIR) 10 MG tablet Take 1 tablet (10 mg total) by mouth at bedtime.   Multiple Vitamin (MULTIVITAMIN ADULT PO) Take by mouth.   rosuvastatin (CRESTOR) 10 MG tablet Take 1 tablet (10 mg total) by mouth daily.   tolterodine  (DETROL LA) 2 MG 24 hr capsule Take 1 capsule (2 mg total) by mouth daily.   traMADol (ULTRAM) 50 MG tablet Take 1 tablet (50 mg total) by mouth 2 (two) times daily as needed.   valsartan-hydrochlorothiazide (DIOVAN-HCT) 160-12.5 MG tablet Take 1 tablet by mouth daily.   vitamin C (ASCORBIC ACID) 500 MG tablet Take 1 tablet by mouth daily.   zolpidem (AMBIEN CR) 12.5 MG CR tablet Take 1 tablet (12.5 mg total) by mouth at bedtime.   [DISCONTINUED] fluticasone-salmeterol (ADVAIR) 250-50 MCG/ACT AEPB INHALE 1 DOSE BY MOUTH IN THE MORNING AND AT BEDTIME   Immunization History  Administered Date(s) Administered   Fluad Quad(high Dose 65+) 12/13/2018, 01/09/2020, 12/24/2020   Influenza, High Dose Seasonal PF 01/20/2016, 12/15/2016, 01/19/2018   Influenza, Seasonal, Injecte, Preservative Fre 02/18/2010, 02/11/2011, 03/29/2012   Influenza,inj,Quad PF,6+ Mos 01/03/2013, 01/10/2014, 03/05/2015   Influenza-Unspecified 12/17/2013   Moderna Sars-Covid-2 Vaccination 05/26/2019, 06/26/2019, 02/20/2020   Pneumococcal Conjugate-13 06/19/2014   Pneumococcal Polysaccharide-23 02/18/2010, 07/02/2015   Td 08/12/2016   Tdap 09/14/2007   Zoster, Live 08/20/2015     Current Meds  Medication Sig   albuterol (VENTOLIN HFA) 108 (90 Base) MCG/ACT inhaler Inhale 2 puffs into the lungs every 6 (six) hours as needed for wheezing or shortness of breath.   amLODipine (NORVASC) 10 MG tablet Take 1 tablet (10 mg total) by mouth daily.   aspirin EC 81 MG tablet Take 1 tablet (81 mg total) by mouth daily.   Azelastine-Fluticasone 137-50 MCG/ACT SUSP Place 1 spray into both nostrils 2 (two) times daily.   benzonatate (TESSALON) 100 MG capsule Take 1-2 capsules (100-200 mg total) by mouth 2 (two) times daily as needed.   Cholecalciferol (VITAMIN D) 2000 UNITS tablet Take 1 tablet by mouth daily.   fluticasone-salmeterol (ADVAIR) 250-50 MCG/ACT AEPB INHALE 1 DOSE BY MOUTH IN THE MORNING AND AT BEDTIME   loratadine  (CLARITIN) 10 MG tablet Take 1 tablet (10 mg total) by mouth daily.   Magnesium 250 MG TABS Take 1 tablet by mouth daily.   metaxalone (SKELAXIN) 800 MG tablet Take 1 tablet (800 mg total) by mouth 3 (three) times daily as needed. for muscle spams   montelukast (SINGULAIR) 10 MG tablet Take 1 tablet (10 mg total) by mouth at bedtime.   Multiple Vitamin (MULTIVITAMIN ADULT PO) Take by mouth.   rosuvastatin (CRESTOR) 10 MG tablet Take 1 tablet (10 mg total) by mouth daily.   tolterodine (DETROL LA) 2 MG 24 hr capsule Take 1 capsule (2 mg total) by mouth daily.   traMADol (ULTRAM) 50 MG tablet Take 1 tablet (50 mg total) by mouth 2 (two) times daily as needed.   valsartan-hydrochlorothiazide (DIOVAN-HCT) 160-12.5 MG tablet Take 1 tablet by mouth daily.   vitamin C (ASCORBIC ACID) 500 MG tablet Take 1  tablet by mouth daily.   zolpidem (AMBIEN CR) 12.5 MG CR tablet Take 1 tablet (12.5 mg total) by mouth at bedtime.   Immunization History  Administered Date(s) Administered   Fluad Quad(high Dose 65+) 12/13/2018, 01/09/2020, 12/24/2020   Influenza, High Dose Seasonal PF 01/20/2016, 12/15/2016, 01/19/2018   Influenza, Seasonal, Injecte, Preservative Fre 02/18/2010, 02/11/2011, 03/29/2012   Influenza,inj,Quad PF,6+ Mos 01/03/2013, 01/10/2014, 03/05/2015   Influenza-Unspecified 12/17/2013   Moderna Sars-Covid-2 Vaccination 05/26/2019, 06/26/2019, 02/20/2020   Pneumococcal Conjugate-13 06/19/2014   Pneumococcal Polysaccharide-23 02/18/2010, 07/02/2015   Td 08/12/2016   Tdap 09/14/2007   Zoster, Live 08/20/2015        Objective:   Physical Exam BP 124/82 (BP Location: Left Arm, Cuff Size: Normal)    Pulse 97    Temp 97.9 F (36.6 C) (Temporal)    Ht 5\' 4"  (1.626 m)    Wt 173 lb 9.6 oz (78.7 kg)    SpO2 95%    BMI 29.80 kg/m  GENERAL: Well-developed, overweight woman in no acute distress, well groomed.  Fully ambulatory.  Intermittent coughing.  Nonproductive.  Mild conversational  dyspnea. HEAD: Normocephalic, atraumatic.  Nasal quality to speech. EYES: Pupils equal, round, reactive to light.  No scleral icterus.  MOUTH: Nose/mouth/throat not examined due to masking requirements for COVID 19. NECK: Supple. No thyromegaly. Trachea midline. No JVD.  No adenopathy. PULMONARY: Good air entry bilaterally.  Diffuse end expiratory wheezes, no rhonchi noted. CARDIOVASCULAR: S1 and S2. Regular rate and rhythm.  Grade 1/6 systolic ejection murmur left sternal border.  No gallops or rubs.   GASTROINTESTINAL: No distention MUSCULOSKELETAL: No joint deformity, no clubbing, no edema.  NEUROLOGIC: Awake, alert, no focal deficits.  No gait disturbance noted during ambulation. Speech is fluent. SKIN: Intact,warm,dry.  No overt rashes noted. PSYCH: Mood and behavior appropriate     Assessment & Plan:     ICD-10-CM   1. Moderate persistent asthma with acute exacerbation  J45.41 Fluticasone-Umeclidin-Vilant (TRELEGY ELLIPTA) 200-62.5-25 MCG/ACT AEPB    methylPREDNISolone (MEDROL DOSEPAK) 4 MG TBPK tablet    azithromycin (ZITHROMAX) 250 MG tablet    Allergen Panel (27) + IGE    CBC w/Diff    CANCELED: CBC w/Diff    CANCELED: Allergen Panel (27) + IGE   We will treat with Medrol Dosepak Azithromycin taper pack Reassess eosinophils/allergen panel Switch to Trelegy 200/62.5/25     2. Chronic pansinusitis  J32.4    This issue adds complexity to her management Chronic pansinusitis, triggers asthma symptoms      Orders Placed This Encounter  Procedures   Allergen Panel (27) + IGE    Standing Status:   Future    Number of Occurrences:   1    Standing Expiration Date:   04/26/2022   CBC w/Diff    Standing Status:   Future    Number of Occurrences:   1    Standing Expiration Date:   04/26/2022   Meds ordered this encounter  Medications   Fluticasone-Umeclidin-Vilant (TRELEGY ELLIPTA) 200-62.5-25 MCG/ACT AEPB    Sig: Inhale 1 puff into the lungs daily.    Dispense:  28 each     Refill:  0    Order Specific Question:   Lot Number?    Answer:   [id number]    Order Specific Question:   Expiration Date?    Answer:   08/17/2022    Order Specific Question:   Manufacturer?    Answer:   [employer] [12]    Order Specific Question:  Quantity    Answer:   3   methylPREDNISolone (MEDROL DOSEPAK) 4 MG TBPK tablet    Sig: Take as directed in the package.    Dispense:  21 tablet    Refill:  0   azithromycin (ZITHROMAX) 250 MG tablet    Sig: Take 2 tablets (500 mg) on  Day 1,  followed by 1 tablet (250 mg) once daily on Days 2 through 5.    Dispense:  6 each    Refill:  0   Patient was instructed to discontinue Advair.  We have switched her to Trelegy Ellipta.  She has received a Medrol Dosepak prescription as well as Azithromycin.  We will reassess allergic work-up.  We will see her in follow-up in 3 to 4 weeks time with either myself or the nurse practitioner at that time.  Pending laboratory testing will consider initiating Dupixent for this patient.  She was also advised that she needs to be compliant with follow-up visits as her asthma is now bordering in the moderate to severe.  Renold Don, MD Advanced Bronchoscopy PCC[facility]    *This note was dictated using voice recognition software/Dragon.  Despite best efforts to proofread, errors can occur which can change the meaning. Any transcriptional errors that result from this process are unintentional and may not be fully corrected at the time of dictation.

## 2021-04-26 NOTE — Patient Instructions (Signed)
We have changed your inhaler to Trelegy Ellipta this is 1 puff daily make sure you rinse your mouth well after you use it.  Do not use Advair (purple inhaler) WHILE USING THE TRELEGY.  We have sent in prescriptions to your pharmacy for prednisone and an antibiotic.  We have also ordered some blood work that you should get done today.  We will see you in follow-up in 3 to 4 weeks time with either me or the nurse practitioner at that time.

## 2021-04-29 LAB — ALLERGEN PANEL (27) + IGE
Alternaria Alternata IgE: <0.1 kU/L
Aspergillus Fumigatus IgE: <0.1 kU/L
Bahia Grass IgE: <0.1 kU/L
Bermuda Grass IgE: <0.1 kU/L
Cat Dander IgE: <0.1 kU/L
Cedar, Mountain IgE: <0.1 kU/L
Cladosporium Herbarum IgE: 0.64 kU/L — AB
Cocklebur IgE: <0.1 kU/L
Cockroach, American IgE: <0.1 kU/L
Common Silver Birch IgE: <0.1 kU/L
D Farinae IgE: <0.1 kU/L
D Pteronyssinus IgE: <0.1 kU/L
Dog Dander IgE: <0.1 kU/L
Elm, American IgE: <0.1 kU/L
Hickory, White IgE: <0.1 kU/L
IgE (Immunoglobulin E), Serum: 545 IU/mL — ABNORMAL HIGH (ref 6–495)
Johnson Grass IgE: <0.1 kU/L
Kentucky Bluegrass IgE: <0.1 kU/L
Maple/Box Elder IgE: <0.1 kU/L
Mucor Racemosus IgE: <0.1 kU/L
Oak, White IgE: <0.1 kU/L
Penicillium Chrysogen IgE: <0.1 kU/L
Pigweed, Rough IgE: <0.1 kU/L
Plantain, English IgE: <0.1 kU/L
Ragweed, Short IgE: <0.1 kU/L
Setomelanomma Rostrat: <0.1 kU/L
Timothy Grass IgE: <0.1 kU/L
White Mulberry IgE: <0.1 kU/L

## 2021-05-03 ENCOUNTER — Telehealth: Payer: Self-pay

## 2021-05-03 NOTE — Telephone Encounter (Signed)
CALLED PATIENT NO ANSWER LEFT VOICEMAIL FOR A CALL BACK ? ?

## 2021-05-27 ENCOUNTER — Ambulatory Visit: Payer: BC Managed Care – PPO | Admitting: Pulmonary Disease

## 2021-05-31 ENCOUNTER — Telehealth: Payer: Self-pay

## 2021-05-31 ENCOUNTER — Encounter: Payer: Self-pay | Admitting: Pulmonary Disease

## 2021-05-31 ENCOUNTER — Other Ambulatory Visit (HOSPITAL_COMMUNITY): Payer: Self-pay

## 2021-05-31 ENCOUNTER — Ambulatory Visit (INDEPENDENT_AMBULATORY_CARE_PROVIDER_SITE_OTHER): Payer: BC Managed Care – PPO | Admitting: Pulmonary Disease

## 2021-05-31 ENCOUNTER — Other Ambulatory Visit: Payer: Self-pay

## 2021-05-31 VITALS — BP 132/74 | HR 69 | Temp 97.5°F | Ht 63.0 in | Wt 175.0 lb

## 2021-05-31 DIAGNOSIS — J4550 Severe persistent asthma, uncomplicated: Secondary | ICD-10-CM | POA: Diagnosis not present

## 2021-05-31 DIAGNOSIS — J324 Chronic pansinusitis: Secondary | ICD-10-CM

## 2021-05-31 DIAGNOSIS — R768 Other specified abnormal immunological findings in serum: Secondary | ICD-10-CM | POA: Diagnosis not present

## 2021-05-31 MED ORDER — TRELEGY ELLIPTA 200-62.5-25 MCG/ACT IN AEPB: 200.0000 ug | INHALATION_SPRAY | Freq: Every day | RESPIRATORY_TRACT | 0 refills | Status: DC

## 2021-05-31 MED ORDER — TRELEGY ELLIPTA 200-62.5-25 MCG/ACT IN AEPB: 1.0000 | INHALATION_SPRAY | Freq: Every day | RESPIRATORY_TRACT | 11 refills | Status: DC

## 2021-05-31 MED ORDER — TRELEGY ELLIPTA 100-62.5-25 MCG/ACT IN AEPB: 100.0000 ug | INHALATION_SPRAY | Freq: Every day | RESPIRATORY_TRACT | 0 refills | Status: DC

## 2021-05-31 NOTE — Telephone Encounter (Signed)
Dupixent forms filled out and faxed to [facility] team.

## 2021-05-31 NOTE — Telephone Encounter (Signed)
Eligibilty checks reveal pt has active Rx coverage through two different [employer] plans through employer as well as [employer].  Submitted a Prior Authorization request to [employer] for [place] via CoverMyMeds. Will update once we receive a response.  Key: [id number]    Submitted a Prior Authorization request to [facility] for [place] via CoverMyMeds. Will update once we receive a response.  Key: [place] Else

## 2021-05-31 NOTE — Patient Instructions (Addendum)
We are going to continue the Trelegy Ellipta.  You will need to start a shot called Dupixent this will help with your asthma and sinus issues.   We will see him in follow-up in 4 to 6 weeks time with either me or the nurse practitioner at that time.   We did identified a mold allergy.  You may want to check your home for areas with mold or that have some persistent dampness.

## 2021-05-31 NOTE — Progress Notes (Signed)
Subjective:    Patient ID: Hayley Howe, female    DOB: 05/16/1945, 76 y.o.   MRN: [id number] Chief Complaint  Patient presents with   Follow-up    HPI Patient is a 76 year old remote former smoker with a history of severe pansinusitis followed by Dr. Tami Ribas who has moderate to severe persistent asthma and presents today for follow-up on the same.  She was last seen here on 26 April 2021 after a hiatus of 2 years.  The patient had previously been controlled on Breo Ellipta however insurance switched her to Advair without notifying us.  She was not controlled on Advair at that time.  On her visit in January she was noted to have an exacerbation.  She was switched to Trelegy Ellipta 200 and has noted that since then her shortness of breath has totally resolved.  She does note wheezing and nasal congestion.  At her last visit we obtain an allergen panel which showed that her IgE is 545 she also showed sensitivity class II to Cladosporium herbarum, a common household mold.  She does not identify issues with mold at her home that she is aware of..  She has not had any fevers, chills or sweats.  She has not had any hemoptysis.  Sputum is yellow to pale green.  She has chronic sinus issues which usually trigger her chest symptoms.  These remain unchanged.  Since starting Trelegy she has not had to use her rescue albuterol.  She still notices occasional wheezing but not as severe as prior.  We discussed next step therapy which would include Dupixent.  Patient is in agreement.   Review of Systems A 10 point review of systems was performed and it is as noted above otherwise negative.  Patient Active Problem List   Diagnosis Date Noted   Senile purpura ([place]) 05/14/2020   Moderate persistent asthma without complication 31/49/7026   Pre-diabetes 05/14/2020   Hyperglycemia 07/13/2017   Chronic constipation 12/15/2016   Primary osteoarthritis of left hip 11/05/2015   Scoliosis 11/05/2015   Chronic  low back pain 10/30/2014   Benign essential HTN 10/27/2014   Bradycardia 10/27/2014   Insomnia 10/27/2014   Headache, temporal 10/27/2014   Hypercholesteremia 10/27/2014   Obesity (BMI 30-39.9) 10/27/2014   Changing sleep-work schedule, affecting sleep 10/27/2014   Vitamin D deficiency 10/27/2014   Arthritis of knee, degenerative 09/24/2009   Social History   Tobacco Use   Smoking status: Former    Packs/day: 0.25    Years: 1.00    Pack years: 0.25    Types: Cigarettes    Start date: 08/13/1967    Quit date: 04/18/1977    Years since quitting: 44.1   Smokeless tobacco: Never   Tobacco comments:    smoking cessation materials not required  Substance Use Topics   Alcohol use: No    Alcohol/week: 0.0 standard drinks   No Known Allergies  Current Meds  Medication Sig   albuterol (VENTOLIN HFA) 108 (90 Base) MCG/ACT inhaler Inhale 2 puffs into the lungs every 6 (six) hours as needed for wheezing or shortness of breath.   amLODipine (NORVASC) 10 MG tablet Take 1 tablet (10 mg total) by mouth daily.   aspirin EC 81 MG tablet Take 1 tablet (81 mg total) by mouth daily.   Azelastine-Fluticasone 137-50 MCG/ACT SUSP Place 1 spray into both nostrils 2 (two) times daily.   benzonatate (TESSALON) 100 MG capsule Take 1-2 capsules (100-200 mg total) by mouth 2 (two) times daily  as needed.   Cholecalciferol (VITAMIN D) 2000 UNITS tablet Take 1 tablet by mouth daily.   Fluticasone-Umeclidin-Vilant (TRELEGY ELLIPTA) 200-62.5-25 MCG/ACT AEPB Inhale 1 puff into the lungs daily.   Fluticasone-Umeclidin-Vilant (TRELEGY ELLIPTA) 200-62.5-25 MCG/ACT AEPB Inhale 200 mcg into the lungs daily.   [START ON 06/14/2021] Fluticasone-Umeclidin-Vilant (TRELEGY ELLIPTA) 200-62.5-25 MCG/ACT AEPB Inhale 1 puff into the lungs daily.   loratadine (CLARITIN) 10 MG tablet Take 1 tablet (10 mg total) by mouth daily.   Magnesium 250 MG TABS Take 1 tablet by mouth daily.   metaxalone (SKELAXIN) 800 MG tablet Take 1  tablet (800 mg total) by mouth 3 (three) times daily as needed. for muscle spams   montelukast (SINGULAIR) 10 MG tablet Take 1 tablet (10 mg total) by mouth at bedtime.   Multiple Vitamin (MULTIVITAMIN ADULT PO) Take by mouth.   rosuvastatin (CRESTOR) 10 MG tablet Take 1 tablet (10 mg total) by mouth daily.   tolterodine (DETROL LA) 2 MG 24 hr capsule Take 1 capsule (2 mg total) by mouth daily.   traMADol (ULTRAM) 50 MG tablet Take 1 tablet (50 mg total) by mouth 2 (two) times daily as needed.   valsartan-hydrochlorothiazide (DIOVAN-HCT) 160-12.5 MG tablet Take 1 tablet by mouth daily.   vitamin C (ASCORBIC ACID) 500 MG tablet Take 1 tablet by mouth daily.   zolpidem (AMBIEN CR) 12.5 MG CR tablet Take 1 tablet (12.5 mg total) by mouth at bedtime.   [DISCONTINUED] Fluticasone-Umeclidin-Vilant (TRELEGY ELLIPTA) 100-62.5-25 MCG/ACT AEPB Inhale 100 mcg into the lungs daily.   [DISCONTINUED] methylPREDNISolone (MEDROL DOSEPAK) 4 MG TBPK tablet Take as directed in the package.   Immunization History  Administered Date(s) Administered   Fluad Quad(high Dose 65+) 12/13/2018, 01/09/2020, 12/24/2020   Influenza, High Dose Seasonal PF 01/20/2016, 12/15/2016, 01/19/2018   Influenza, Seasonal, Injecte, Preservative Fre 02/18/2010, 02/11/2011, 03/29/2012   Influenza,inj,Quad PF,6+ Mos 01/03/2013, 01/10/2014, 03/05/2015   Influenza-Unspecified 12/17/2013   Moderna Sars-Covid-2 Vaccination 05/26/2019, 06/26/2019, 02/20/2020   Pneumococcal Conjugate-13 06/19/2014   Pneumococcal Polysaccharide-23 02/18/2010, 07/02/2015   Td 08/12/2016   Tdap 09/14/2007   Zoster, Live 08/20/2015       Objective:   Physical Exam BP 132/74 (BP Location: Left Arm, Patient Position: Sitting, Cuff Size: Normal)    Pulse 69    Temp (!) 97.5 F (36.4 C) (Oral)    Ht 5\' 3"  (1.6 m)    Wt 175 lb (79.4 kg)    SpO2 98%    BMI 31.00 kg/m  GENERAL: Well-developed, overweight woman in no acute distress, well groomed.  Fully  ambulatory.  No conversational dyspnea.  Very nasal quality to speech HEAD: Normocephalic, atraumatic.  Nasal quality to speech. EYES: Pupils equal, round, reactive to light.  No scleral icterus.  MOUTH: Nose/mouth/throat not examined due to masking requirements for COVID 19. NECK: Supple. No thyromegaly. Trachea midline. No JVD.  No adenopathy. PULMONARY: Good air entry bilaterally.  No adventitious sounds. CARDIOVASCULAR: S1 and S2. Regular rate and rhythm.  Grade 1/6 systolic ejection murmur left sternal border.  No gallops or rubs.   GASTROINTESTINAL: Benign. MUSCULOSKELETAL: No joint deformity, no clubbing, no edema.  NEUROLOGIC: No focal deficit, no gait disturbance, speech is fluent.   SKIN: Intact,warm,dry.  No overt rashes noted. PSYCH: Mood and behavior appropriate     Assessment & Plan:     ICD-10-CM   1. Severe persistent asthma without complication  E52.77    Better control with Trelegy Ellipta 200 Meets criteria for Dupixent, initiate therapy Continue as needed  albuterol    2. Chronic pansinusitis  J32.4    Continue follow-up with Dr. Tami Ribas Dupixent should also help    3. Elevated IgE level  R76.8    Sensitivity to Cladosporium herbarum Encouraged to identify mold areas in her home and eradicate      Meds ordered this encounter  Medications   DISCONTD: Fluticasone-Umeclidin-Vilant (TRELEGY ELLIPTA) 100-62.5-25 MCG/ACT AEPB    Sig: Inhale 100 mcg into the lungs daily.    Dispense:  28 each    Refill:  0    Order Specific Question:   Lot Number?    Answer:   [id number]    Order Specific Question:   Expiration Date?    Answer:   10/16/2022    Order Specific Question:   Manufacturer?    Answer:   [employer] [[id number]]    Order Specific Question:   NDC    Answer:   3614-4315-40 [[id number]]    Order Specific Question:   Quantity    Answer:   1   Fluticasone-Umeclidin-Vilant (TRELEGY ELLIPTA) 200-62.5-25 MCG/ACT AEPB    Sig: Inhale 200 mcg into the lungs daily.     Dispense:  28 each    Refill:  0    Order Specific Question:   Lot Number?    Answer:   [id number]    Order Specific Question:   Expiration Date?    Answer:   10/16/2022    Order Specific Question:   Manufacturer?    Answer:   [employer] [[id number]]    Order Specific Question:   NDC    Answer:   9509-3267-12 [[id number]]    Order Specific Question:   Quantity    Answer:   1   Fluticasone-Umeclidin-Vilant (TRELEGY ELLIPTA) 200-62.5-25 MCG/ACT AEPB    Sig: Inhale 1 puff into the lungs daily.    Dispense:  28 each    Refill:  11    Patient failed Advair and other inhalers.  Well-controlled on Trelegy.    Patient has moderate to severe persistent asthma.  This is driven mostly by chronic rhinosinusitis.  She also has mold sensitivity.  Information about mold sensitivity given to the patient.  Her IgE was 545 which was elevated.  We will initiate Dupixent paperwork to get her approved for the same.  We will see the patient in follow-up in 4 to 6 weeks time she is to contact us prior to that time should any new difficulties arise.   Renold Don, MD Advanced [facility]    *This note was dictated using voice recognition software/Dragon.  Despite best efforts to proofread, errors can occur which can change the meaning. Any transcriptional errors that result from this process are unintentional and may not be fully corrected at the time of dictation.

## 2021-06-01 ENCOUNTER — Other Ambulatory Visit (HOSPITAL_COMMUNITY): Payer: Self-pay

## 2021-06-01 NOTE — Telephone Encounter (Signed)
Dupixent forms have been faxed over to RX team . Received confirmation.

## 2021-06-01 NOTE — Telephone Encounter (Signed)
Received notification from [facility] regarding a prior authorization for [place]. Authorization has been APPROVED from 05/31/2021 to 11/28/2021. Approval letter sent to scan center.  Per test claim, copay for 4 pens (loading dose) as a 28 days supply is $1,318.43  Patient is allowed 1 grace fill then must fill through  [facility]  Authorization # [id number]   Received notification from [facility] regarding a prior authorization for [place]. Authorization has been APPROVED from 05/31/2021 to 11/28/2021. Approval letter sent to scan center.  Per test claim, copay for 4 pens (loading dose) as a 28 days supply is $2,289.00  Authorization # [id number]   Commercial plan is not eligible to be used as a COB. Pt may not qualify for copay card due to also having Medicare.

## 2021-06-02 NOTE — Telephone Encounter (Signed)
Submitted Patient Assistance Application to Dupixent [employer] for DUPIXENT along with provider portion, PA and income documents. Will update patient when we receive a response. ° °Fax# [phone] °Phone# [phone] ° °

## 2021-06-18 NOTE — Telephone Encounter (Signed)
Reached out to [place] for status update, advised that pt has not yet expressed financial hardship. ATC pt and discuss, however had to LVM requesting pt return call. Will continue to f/u. ?

## 2021-06-22 NOTE — Telephone Encounter (Signed)
Pt returned call yesterday, however I was out of the office did not receive message until this morning. Pt states she works the 3rd shift usually, and is unavailable starting at 1:30pm daily. ? ?ATC pt again, however LVM requesting she call me back--informed her that I will be in the office all day today and if she is unable to call then I will also be in office tomorrow morning starting at 8am. ? ?Will await pt return call. ?

## 2021-06-22 NOTE — Telephone Encounter (Signed)
Pt returned call and I discussed next steps with her. Informed her of need to reach out to [facility] and express financial hardship, and that I would be going ahead forwarding her info to [facility] as well so she can reach out sometime this week or next week and discuss new start visit. Requested pt reach back out to me if she has any issues, as she has my direct number. Pt verbalized understanding to all, will await response from DMW. ?

## 2021-06-23 ENCOUNTER — Other Ambulatory Visit (HOSPITAL_COMMUNITY): Payer: Self-pay

## 2021-06-24 NOTE — Telephone Encounter (Signed)
Pt reached out to me on my direct phone and LVM stating that she had contacted [facility] and was approved for patient assistance, and that she wanted to get set up with an appointment for self-injection training.  ? ?Returned call to pt explaining that [facility] the clinic's pharmacist would be reaching out to discuss scheduling. Inquired about which days would work best for her (as she usually works 3rd shift and goes to bed around 1:30pm) and pt states that she would be able to come to an afternoon appointment on Wednesdays or Thursdays.  ?

## 2021-06-24 NOTE — Telephone Encounter (Signed)
Patient scheduled for Dupixent new start on 06/30/21. Patient states she is supposed to receive her first [place] shipment tomorrow, 06/25/21. She has been advised to place this supply in her home refrigerator and we will use sample for her new start visit. She verbalized understanding ? ?Knox Saliva, PharmD, MPH, BCPS ?Clinical Pharmacist (Rheumatology and Pulmonology) ?

## 2021-06-28 NOTE — Progress Notes (Addendum)
HPI Patient presents today with her daughter-in-law to [facility] to see pharmacy team for Dupixent new start.  Past medical history includes severe persistent asthma, chronic constipation, senira purpura, HTN. She is naive to self-administered injections  Last seen by Dr. Jayme Cloud on 05/31/21  Respiratory Medications Current regimen: Trelegy 200/62.5/25 mcg (1 puff once daily), montelukast 10mg  nightly, loratidine 10mg  daily  OBJECTIVE No Known Allergies  Outpatient Encounter Medications as of 06/30/2021  Medication Sig Note   albuterol (VENTOLIN HFA) 108 (90 Base) MCG/ACT inhaler Inhale 2 puffs into the lungs every 6 (six) hours as needed for wheezing or shortness of breath.    amLODipine (NORVASC) 10 MG tablet Take 1 tablet (10 mg total) by mouth daily.    aspirin EC 81 MG tablet Take 1 tablet (81 mg total) by mouth daily.    Azelastine-Fluticasone 137-50 MCG/ACT SUSP Place 1 spray into both nostrils 2 (two) times daily.    benzonatate (TESSALON) 100 MG capsule Take 1-2 capsules (100-200 mg total) by mouth 2 (two) times daily as needed.    Cholecalciferol (VITAMIN D) 2000 UNITS tablet Take 1 tablet by mouth daily.    Fluticasone-Umeclidin-Vilant (TRELEGY ELLIPTA) 200-62.5-25 MCG/ACT AEPB Inhale 1 puff into the lungs daily.    Fluticasone-Umeclidin-Vilant (TRELEGY ELLIPTA) 200-62.5-25 MCG/ACT AEPB Inhale 200 mcg into the lungs daily.    Fluticasone-Umeclidin-Vilant (TRELEGY ELLIPTA) 200-62.5-25 MCG/ACT AEPB Inhale 1 puff into the lungs daily.    loratadine (CLARITIN) 10 MG tablet Take 1 tablet (10 mg total) by mouth daily.    Magnesium 250 MG TABS Take 1 tablet by mouth daily.    metaxalone (SKELAXIN) 800 MG tablet Take 1 tablet (800 mg total) by mouth 3 (three) times daily as needed. for muscle spams 04/21/2020: PRN only not often    montelukast (SINGULAIR) 10 MG tablet Take 1 tablet (10 mg total) by mouth at bedtime.    Multiple Vitamin (MULTIVITAMIN ADULT PO) Take by mouth.     rosuvastatin (CRESTOR) 10 MG tablet Take 1 tablet (10 mg total) by mouth daily.    tolterodine (DETROL LA) 2 MG 24 hr capsule Take 1 capsule (2 mg total) by mouth daily.    traMADol (ULTRAM) 50 MG tablet Take 1 tablet (50 mg total) by mouth 2 (two) times daily as needed.    valsartan-hydrochlorothiazide (DIOVAN-HCT) 160-12.5 MG tablet Take 1 tablet by mouth daily.    vitamin C (ASCORBIC ACID) 500 MG tablet Take 1 tablet by mouth daily.    zolpidem (AMBIEN CR) 12.5 MG CR tablet Take 1 tablet (12.5 mg total) by mouth at bedtime.    No facility-administered encounter medications on file as of 06/30/2021.     Immunization History  Administered Date(s) Administered   Fluad Quad(high Dose 65+) 12/13/2018, 01/09/2020, 12/24/2020   Influenza, High Dose Seasonal PF 01/20/2016, 12/15/2016, 01/19/2018   Influenza, Seasonal, Injecte, Preservative Fre 02/18/2010, 02/11/2011, 03/29/2012   Influenza,inj,Quad PF,6+ Mos 01/03/2013, 01/10/2014, 03/05/2015   Influenza-Unspecified 12/17/2013   Moderna Sars-Covid-2 Vaccination 05/26/2019, 06/26/2019, 02/20/2020   Pneumococcal Conjugate-13 06/19/2014   Pneumococcal Polysaccharide-23 02/18/2010, 07/02/2015   Td 08/12/2016   Tdap 09/14/2007   Zoster Recombinat (Shingrix) 06/03/2021   Zoster, Live 08/20/2015    PFTs No flowsheet data found.   Eosinophils Most recent blood eosinophil count was 500 cells/microL taken on 04/26/21.   IgE: 545 on 04/26/21  Assessment   Biologics training for dupilumab (Dupixent)  Goals of therapy: Mechanism: human monoclonal IgG4 antibody that inhibits interleukin-4 and interleukin-13 cytokine-induced responses, including release of  proinflammatory cytokines, chemokines, and IgE Reviewed that Dupixent is add-on medication and patient must continue maintenance inhaler regimen. Response to therapy: may take 4 months to determine efficacy. Discussed that patients generally feel improvement sooner than 4 months.  Side effects:  injection site reaction (6-18%), antibody development (5-16%), ophthalmic conjunctivitis (2-16%), transient blood eosinophilia (1-2%)  Dose: 600mg  at Week 0 (administered today in clinic) followed by 300mg  every 14 days thereafter  Administration/Storage:  Reviewed administration sites of thigh or abdomen (at least 2-3 inches away from abdomen). Reviewed the upper arm is only appropriate if caregiver is administering injection  Do not shake pen/syringe as this could lead to product foaming or precipitation. Do not use if solution is discolored or contains particulate matter or if window on prefilled pen is yellow (indicates pen has been used).  Reviewed storage of medication in refrigerator. Reviewed that Dupixent can be stored at room temperature in unopened carton for up to 14 days.  Access: Approval of Dupixent through: patient assistance (Medicare rx coverage) and copay assistance [facility]). At this point [facility] is not aware of patient having Medicare and has filled patient's medication through her commercial insurance using copay assistance. Patient enrolled into copay card program by [facility] to help with copay assistance. I did review with patient that if her copay ever increases this year or [facility] becomes aware of her Medicare enrollment, she should notify our clinic and we can move forward with ordering refills through patient assistance. She verbalized understanding.  Patient self-administered Dupixent in left and right thigh using Dupixent sample. Dupixent 300mg /42mL autoinjector pen NDC: [phone] Lot: [id number] Exp: 11/16/2022  Patient monitored for 30 minutes. Reported no issues. Initial injection did sting patient but no issues at 30 minute mark.  Medication Reconciliation  A drug regimen assessment was performed, including review of allergies, interactions, disease-state management, dosing and immunization history. Medications were  reviewed with the patient, including name, instructions, indication, goals of therapy, potential side effects, importance of adherence, and safe use.  Drug interaction(s): none noted  Immunizations  Patient is indicated for the influenzae, pneumonia, and shingles vaccinations. Patient has received 3 COVID19 vaccines. She is UTD on influenza, pneumonia, and zoster vaccine   PLAN Continue Dupixent 300mg  every 14 days.  Next dose is due 07/14/21 and every 14 days thereafter. Rx sent to: [facility]: [phone] .  Patient provided with pharmacy phone number and advised to call later this week to schedule shipment to home. Patient provided with copay card information to provide to pharmacy if quoted copay exceeds $5 per month. Continue maintenance asthma/allergy regimen of: Trelegy 200/62.5/25 mcg (1 puff once daily), montelukast 10mg  nightly, loratidine 10mg  daily  All questions encouraged and answered.  Instructed patient to reach out with any further questions or concerns.  Thank you for allowing pharmacy to participate in this patient's care.  This appointment required 60 minutes of patient care (this includes precharting, chart review, review of results, face-to-face care, etc.).  Chesley Mires, PharmD, MPH, BCPS Clinical Pharmacist (Rheumatology and Pulmonology)

## 2021-06-30 ENCOUNTER — Other Ambulatory Visit: Payer: Self-pay

## 2021-06-30 ENCOUNTER — Ambulatory Visit (INDEPENDENT_AMBULATORY_CARE_PROVIDER_SITE_OTHER): Payer: BC Managed Care – PPO | Admitting: Pharmacist

## 2021-06-30 DIAGNOSIS — J4550 Severe persistent asthma, uncomplicated: Secondary | ICD-10-CM

## 2021-06-30 DIAGNOSIS — Z7189 Other specified counseling: Secondary | ICD-10-CM

## 2021-06-30 MED ORDER — DUPIXENT 300 MG/2ML ~~LOC~~ SOAJ: 300.0000 mg | SUBCUTANEOUS | 1 refills | Status: DC

## 2021-06-30 NOTE — Patient Instructions (Signed)
Your next [place] dose is due on 07/14/21, 07/28/21, and every 14 days thereafter ? ?CONTINUE TRELEGY and montelukast ? ?Your prescription will be shipped from [facility]. Their phone number is [phone] ?Please call to schedule shipment and confirm address. They will mail your medication to your home. ? ?Your copay should be affordable. If you call the pharmacy and it is not affordable, please call us. ? ?You will need to be seen by your provider in 3 to 4 months to assess how [place] is working for you. Please ensure you have a follow-up appointment scheduled in June or July. Call our clinic if you need to make this appointment. ? ?How to manage an injection site reaction: ?Remember the 5 C's: ?COUNTER - leave on the counter at least 30 minutes but up to overnight to bring medication to room temperature. This may help prevent stinging ?COLD - place something cold (like an ice gel pack or cold water bottle) on the injection site just before cleansing with alcohol. This may help reduce pain ?CLARITIN - use Claritin (generic name is loratadine) for the first two weeks of treatment or the day of, the day before, and the day after injecting. This will help to minimize injection site reactions ?CORTISONE CREAM - apply if injection site is irritated and itching ?CALL ME - if injection site reaction is bigger than the size of your fist, looks infected, blisters, or if you develop hives  ?

## 2021-07-01 ENCOUNTER — Ambulatory Visit: Payer: BC Managed Care – PPO | Admitting: Family Medicine

## 2021-07-02 ENCOUNTER — Ambulatory Visit (INDEPENDENT_AMBULATORY_CARE_PROVIDER_SITE_OTHER): Payer: BC Managed Care – PPO | Admitting: Adult Health

## 2021-07-02 ENCOUNTER — Other Ambulatory Visit: Payer: Self-pay

## 2021-07-02 ENCOUNTER — Encounter: Payer: Self-pay | Admitting: Adult Health

## 2021-07-02 DIAGNOSIS — J4540 Moderate persistent asthma, uncomplicated: Secondary | ICD-10-CM

## 2021-07-02 DIAGNOSIS — J310 Chronic rhinitis: Secondary | ICD-10-CM

## 2021-07-02 NOTE — Progress Notes (Signed)
? ?'@Patient'$  ID: Hayley Howe, female    DOB: 06/10/1945, 76 y.o.   MRN: [id number] ? ?Chief Complaint  ?Patient presents with  ? Follow-up  ? ? ?Referring provider: ?Steele Sizer, MD ? ?HPI: ?76 year old female former smoker followed for moderate to severe persistent asthma with allergic phenotype, chronic sinusitis ? ?TEST/EVENTS : ?PFTs on February 28, 2019 showed normal lung function with an FEV1 at 87%, ratio 72, FVC 94%, no significant bronchodilator response ? ?CT chest November 2020 mild bronchial wall thickening with bronchial secretions and associated scarring and linear atelectasis in the right middle lobe, left upper lobe and lingula.  Otherwise clear with no evidence of interstitial lung disease, findings of healed granulomatous disease in the right lower lobe ? ?CT sinus April 08, 2019 extensive acute on chronic sinusitis, air-fluid levels in the maxillary sinuses, minimal improvement and left frontal sinus and scattered ethmoid air cells ? ?January 2023 absolute eosinophil count 500, IgE 545, allergen panel negative except for Cladosporium Herbarum IgE (common mold)  ? ?07/02/2021 Follow up : Asthma , Chronic sinus disease ?Patient returns for a 1 month follow-up.  Patient has underlying severe persistent asthma with allergic phenotype.  Last visit patient was recommended to continue on Trelegy inhaler and Singulair and Claritin daily. Continued on asthma action plan.  And was recommended to begin Dupixent to help with symptom management.  She received her first Dupixent injection on June 30, 2021 with the pharmacy team.  She will be doing home injections. Denies any known reaction . No redness at injection site.  Patient says overall she is doing okay with no flare of cough or wheezing . Does have increased sinus drainage with increased pollen outside.  ?Typically uses albuterol Twice daily, went over rescue vs maintenance inhalers.  ?Tries to be active, not able to exercise much due to back  issues. Lives at home. Drives . Works Biochemist, clinical at [employer].   ? ? ? ? ? ?No Known Allergies ? ?Immunization History  ?Administered Date(s) Administered  ? Fluad Quad(high Dose 65+) 12/13/2018, 01/09/2020, 12/24/2020  ? Influenza, High Dose Seasonal PF 01/20/2016, 12/15/2016, 01/19/2018  ? Influenza, Seasonal, Injecte, Preservative Fre 02/18/2010, 02/11/2011, 03/29/2012  ? Influenza,inj,Quad PF,6+ Mos 01/03/2013, 01/10/2014, 03/05/2015  ? Influenza-Unspecified 12/17/2013  ? Moderna Sars-Covid-2 Vaccination 05/26/2019, 06/26/2019, 02/20/2020  ? Pneumococcal Conjugate-13 06/19/2014  ? Pneumococcal Polysaccharide-23 02/18/2010, 07/02/2015  ? Td 08/12/2016  ? Tdap 09/14/2007  ? Zoster Recombinat (Shingrix) 06/03/2021  ? Zoster, Live 08/20/2015  ? ? ?Past Medical History:  ?Diagnosis Date  ? Hyperlipidemia   ? Hypertension   ? Insomnia   ? Low back pain   ? Obesity   ? Osteoarthritis of left knee   ? ? ?Tobacco History: ?Social History  ? ?Tobacco Use  ?Smoking Status Former  ? Packs/day: 0.25  ? Years: 1.00  ? Pack years: 0.25  ? Types: Cigarettes  ? Start date: 08/13/1967  ? Quit date: 04/18/1977  ? Years since quitting: 44.2  ?Smokeless Tobacco Never  ?Tobacco Comments  ? smoking cessation materials not required  ? ?Counseling given: Not Answered ?Tobacco comments: smoking cessation materials not required ? ? ?Outpatient Medications Prior to Visit  ?Medication Sig Dispense Refill  ? albuterol (VENTOLIN HFA) 108 (90 Base) MCG/ACT inhaler Inhale 2 puffs into the lungs every 6 (six) hours as needed for wheezing or shortness of breath. 8 g 6  ? amLODipine (NORVASC) 10 MG tablet Take 1 tablet (10 mg total) by mouth daily. 90 tablet 1  ?  aspirin EC 81 MG tablet Take 1 tablet (81 mg total) by mouth daily. 30 tablet 0  ? Azelastine-Fluticasone 137-50 MCG/ACT SUSP Place 1 spray into both nostrils 2 (two) times daily.    ? benzonatate (TESSALON) 100 MG capsule Take 1-2 capsules (100-200 mg total) by mouth 2 (two) times daily as  needed. 90 capsule 0  ? Cholecalciferol (VITAMIN D) 2000 UNITS tablet Take 1 tablet by mouth daily.    ? Dupilumab (DUPIXENT) 300 MG/2ML SOPN Inject 300 mg into the skin every 14 (fourteen) days. 12 mL 1  ? Fluticasone-Umeclidin-Vilant (TRELEGY ELLIPTA) 200-62.5-25 MCG/ACT AEPB Inhale 1 puff into the lungs daily. 28 each 11  ? loratadine (CLARITIN) 10 MG tablet Take 1 tablet (10 mg total) by mouth daily. 30 tablet 0  ? Magnesium 250 MG TABS Take 1 tablet by mouth daily.    ? metaxalone (SKELAXIN) 800 MG tablet Take 1 tablet (800 mg total) by mouth 3 (three) times daily as needed. for muscle spams 90 tablet 0  ? montelukast (SINGULAIR) 10 MG tablet Take 1 tablet (10 mg total) by mouth at bedtime. 90 tablet 1  ? Multiple Vitamin (MULTIVITAMIN ADULT PO) Take by mouth.    ? rosuvastatin (CRESTOR) 10 MG tablet Take 1 tablet (10 mg total) by mouth daily. 90 tablet 1  ? tolterodine (DETROL LA) 2 MG 24 hr capsule Take 1 capsule (2 mg total) by mouth daily. 90 capsule 1  ? traMADol (ULTRAM) 50 MG tablet Take 1 tablet (50 mg total) by mouth 2 (two) times daily as needed. 120 tablet 0  ? valsartan-hydrochlorothiazide (DIOVAN-HCT) 160-12.5 MG tablet Take 1 tablet by mouth daily. 90 tablet 1  ? vitamin C (ASCORBIC ACID) 500 MG tablet Take 1 tablet by mouth daily.    ? zolpidem (AMBIEN CR) 12.5 MG CR tablet Take 1 tablet (12.5 mg total) by mouth at bedtime. 90 tablet 0  ? Fluticasone-Umeclidin-Vilant (TRELEGY ELLIPTA) 200-62.5-25 MCG/ACT AEPB Inhale 1 puff into the lungs daily. 28 each 0  ? Fluticasone-Umeclidin-Vilant (TRELEGY ELLIPTA) 200-62.5-25 MCG/ACT AEPB Inhale 200 mcg into the lungs daily. 28 each 0  ? ?No facility-administered medications prior to visit.  ? ? ? ?Review of Systems:  ? ?Constitutional:   No  weight loss, night sweats,  Fevers, chills, fatigue, or  lassitude. ? ?HEENT:   No headaches,  Difficulty swallowing,  Tooth/dental problems, or  Sore throat,  ?              No sneezing, itching, ear ache,  ?+nasal  congestion, post nasal drip,  ? ?CV:  No chest pain,  Orthopnea, PND, swelling in lower extremities, anasarca, dizziness, palpitations, syncope.  ? ?GI  No heartburn, indigestion, abdominal pain, nausea, vomiting, diarrhea, change in bowel habits, loss of appetite, bloody stools.  ? ?Resp:.  No chest wall deformity ? ?Skin: no rash or lesions. ? ?GU: no dysuria, change in color of urine, no urgency or frequency.  No flank pain, no hematuria  ? ?MS:  No joint pain or swelling.  No decreased range of motion.  No back pain. ? ? ? ?Physical Exam ? ?BP 128/80 (BP Location: Left Arm, Patient Position: Sitting, Cuff Size: Normal)   Pulse 77   Temp (!) 97.3 ?F (36.3 ?C) (Oral)   Ht '5\' 4"'$  (1.626 m)   Wt 179 lb 3.2 oz (81.3 kg)   SpO2 99%   BMI 30.76 kg/m?  ? ?GEN: A/Ox3; pleasant , NAD, well nourished  ?  ?HEENT:  [place]/AT,  NOSE-clear, THROAT-clear, no lesions, no postnasal drip or exudate noted.  ? ?NECK:  Supple w/ fair ROM; no JVD; normal carotid impulses w/o bruits; no thyromegaly or nodules palpated; no lymphadenopathy.   ? ?RESP  Clear  P & A; w/o, wheezes/ rales/ or rhonchi. no accessory muscle use, no dullness to percussion ? ?CARD:  RRR, no m/r/g, no peripheral edema, pulses intact, no cyanosis or clubbing. ? ?GI:   Soft & nt; nml bowel sounds; no organomegaly or masses detected.  ? ?Musco: Warm bil, no deformities or joint swelling noted.  ? ?Neuro: alert, no focal deficits noted.   ? ?Skin: Warm, no lesions or rashes ? ? ? ?Lab Results: ? ?CBC ?   ?Component Value Date/Time  ? WBC 8.2 04/26/2021 1017  ? RBC 4.33 04/26/2021 1017  ? HGB 12.0 04/26/2021 1017  ? HCT 35.7 (L) 04/26/2021 1017  ? PLT 228 04/26/2021 1017  ? MCV 82.4 04/26/2021 1017  ? MCH 27.7 04/26/2021 1017  ? MCHC 33.6 04/26/2021 1017  ? RDW 15.6 (H) 04/26/2021 1017  ? LYMPHSABS 2.1 04/26/2021 1017  ? MONOABS 0.5 04/26/2021 1017  ? EOSABS 0.5 04/26/2021 1017  ? BASOSABS 0.1 04/26/2021 1017  ? ? ?BMET ?   ?Component Value Date/Time  ? NA 139  09/17/2020 1007  ? NA 141 10/30/2014 1516  ? K 4.5 09/17/2020 1007  ? CL 103 09/17/2020 1007  ? CO2 30 09/17/2020 1007  ? GLUCOSE 80 09/17/2020 1007  ? BUN 18 09/17/2020 1007  ? BUN 19 10/30/2014 1516  ? CREATININE 0.8

## 2021-07-02 NOTE — Assessment & Plan Note (Signed)
Moderate to severe persistent asthma with an allergic phenotype.  Patient is continue on her aggressive maintenance regimen with Trelegy, Singulair, Claritin.  Dupixent has been added this week.  We will continue to monitor closely. ? ?Plan  ?Patient Instructions  ?Continue on Trelegy 1 puff daily, rinse after use ?Continue on Singulair and Claritin daily ?Albuterol inhaler as needed ?Asthma action plan as discussed ?Saline nasal rinses Twice daily  .  ?Continue on Dupixent every 2 weeks ?Follow-up with Dr. Patsey Berthold in 2 to 3 months and As needed   ? ?  ? ? ? ?

## 2021-07-02 NOTE — Patient Instructions (Addendum)
Continue on Trelegy 1 puff daily, rinse after use ?Continue on Singulair and Claritin daily ?Albuterol inhaler as needed ?Asthma action plan as discussed ?Saline nasal rinses Twice daily  .  ?Continue on Dupixent every 2 weeks ?Follow-up with Dr. Patsey Berthold in 2 to 3 months and As needed   ? ?

## 2021-07-02 NOTE — Assessment & Plan Note (Signed)
Continue on current regimen  ?Control for triggers ? ?Plan  ?Patient Instructions  ?Continue on Trelegy 1 puff daily, rinse after use ?Continue on Singulair and Claritin daily ?Albuterol inhaler as needed ?Asthma action plan as discussed ?Saline nasal rinses Twice daily  .  ?Continue on Dupixent every 2 weeks ?Follow-up with Dr. Patsey Berthold in 2 to 3 months and As needed   ? ?  ? ?

## 2021-07-05 NOTE — Progress Notes (Signed)
Agree with the details of the visit as noted by Tammy Parrett, NP.  C. Laura Garin Mata, MD [facility] 

## 2021-07-09 ENCOUNTER — Telehealth: Payer: Self-pay | Admitting: Pharmacist

## 2021-07-09 ENCOUNTER — Other Ambulatory Visit (HOSPITAL_COMMUNITY): Payer: Self-pay

## 2021-07-09 DIAGNOSIS — J4550 Severe persistent asthma, uncomplicated: Secondary | ICD-10-CM

## 2021-07-09 MED ORDER — DUPIXENT 300 MG/2ML ~~LOC~~ SOAJ: 300.0000 mg | SUBCUTANEOUS | 1 refills | Status: DC

## 2021-07-09 NOTE — Telephone Encounter (Addendum)
Rx was initially triaged incorrectly to [facility], as patient was allowed one transitional fill but would ultimately need to fill through [facility]. Contacted pt and discussed plan and provided her with instructions on what she would need to do. Requested that she please reach back out to [place] if she runs into any additional issues or has any questions. Pt verbalized understanding to all. ? ?Copay card info was provided to pt, but will document here for retention purposes: ? ?RxBIN: [id number] ?RxPCN: [facility] ?RxGRP: [id number] ?ID: [id number] ? ?Devki is sending in rx to [facility], nothing further needed at this time. ?

## 2021-07-09 NOTE — Telephone Encounter (Signed)
Rx for Dupixent '300mg'$  SQ every 14 days sent to [facility] ? ?Knox Saliva, PharmD, MPH, BCPS ?Clinical Pharmacist (Rheumatology and Pulmonology) ?

## 2021-07-13 ENCOUNTER — Telehealth: Payer: Self-pay | Admitting: Pulmonary Disease

## 2021-07-13 NOTE — Telephone Encounter (Signed)
Lm for patient.  

## 2021-07-14 NOTE — Telephone Encounter (Signed)
We could try Breztri 2 puffs twice a day. ? ?Otherwise would have to do something like Symbicort plus Spiriva, or Breo plus Incruse.  She has been started on Dupixent so hopefully we can start decreasing respiratory medications but she is not there quite yet. ?

## 2021-07-14 NOTE — Telephone Encounter (Signed)
PA team, please see below message and do test claims. Thanks ?

## 2021-07-14 NOTE — Telephone Encounter (Signed)
Spoke to patient.  ?She stated that trelegy is not affordable.  ? ?Dr. Patsey Berthold, can you provide a list of alternatives so that the pharmacy team can do a test claim?  ?

## 2021-07-15 ENCOUNTER — Other Ambulatory Visit (HOSPITAL_COMMUNITY): Payer: Self-pay

## 2021-07-15 NOTE — Telephone Encounter (Signed)
Good morning! Test billing results are as follows: ?Breztri: $47 ?Symbicort: $47 ?Spiriva: $47 ?Breo: $47 ? ? ?

## 2021-07-15 NOTE — Telephone Encounter (Signed)
Lm for patient.  

## 2021-07-16 NOTE — Telephone Encounter (Signed)
Spoke to patient and relayed below message.  ?She stated that she contacted insurance regarding trelegy co pay. She was told that the pharmacy was filing under the wrong insurance. Co pay is now 47 dollars for trelegy.  ?Nothing further needed.  ? ? ?

## 2021-07-21 NOTE — Progress Notes (Signed)
Name: Hayley Howe   MRN: [id number]    DOB: 07-22-1945   Date:07/22/2021 ? ?     Progress Note ? ?Subjective ? ?Chief Complaint ? ?Follow Up ? ?HPI ? ?Urge incontinence: it is intermittent and takes Detrol prn only, not lately. She thinks symptoms have improved and not currently taking medication , she will call me when she is ready for refills.  ?  ?Chronic sinusitis/Ashma : she was seen by Dr. Duwayne Heck 21  - pulmonologist , had a normal spirometry, CT chest  showed some scarring, CT sinus showed chronic sinusitis back in 2020 and was treated but continued to have the cough, and also has post-nasal drainage, SOB . She is now on Dupixent , singulair at night and also Trelegy, still has to clear her throat constantly and has a dry cough very seldom productive now and sob is not as significant. She feels like medications are helping  ?  ?Chronic Low Back: she has a long history of low back pain, not secondary to trauma, DDD lumbar spine and also scoliosis. taking Tramadol daily to control symptoms (takes 1 daily, takes 2nd tablets a few times a week), she is also on Skelaxin prn unable to tolerate Flexeril - it makes her sleepy. Pain is constant -pain right now is 5/10 , it can go very high after work  .She takes medication prn 90 pills to last 4 months and we will not check drug screen test today, checked data base and I am the only prescriber of controlled medications  ?  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 She sleeps from around 2 pm-8:30pm for about 5-7 hours when she sleeps during the day Taking Ambien, denies side effects of medication, she is aware of FDA guidelines however unable to sleep with lower dose of Ambien. She only takes medications the nights that she works, reviewed controlled medication database . We will continue medication  ?  ?OA: she has aching pain on left knee, s/p left knee replaced, only triggered by kneeling down, surgery was done by Dr.  Rudene Christians in 2012  ?  ?Hyperglycemia: last hgbA1C was 6 % denies polyphagia, polydipsia or polyuria.  Continue low carb diet , we will recheck labs today  ? ?HTN: taking valsartan hctz, and also Norvasc, she states no longer having dizziness, no chest pain or palpitation . BP is at goal  ?  ?Dyslipidemia: last LDL stable on Crestor , no myalgia. Last LDL was at goal for her , we will recheck labs today  ? ?Senile Purpura: stable , recurrent on arms, stable   ? ?Patient Active Problem List  ? Diagnosis Date Noted  ? Chronic rhinitis 07/02/2021  ? Senile purpura ([place]) 05/14/2020  ? Moderate persistent asthma without complication 00/86/7619  ? Pre-diabetes 05/14/2020  ? Hyperglycemia 07/13/2017  ? Chronic constipation 12/15/2016  ? Primary osteoarthritis of left hip 11/05/2015  ? Scoliosis 11/05/2015  ? Chronic low back pain 10/30/2014  ? Benign essential HTN 10/27/2014  ? Bradycardia 10/27/2014  ? Insomnia 10/27/2014  ? Headache, temporal 10/27/2014  ? Hypercholesteremia 10/27/2014  ? Obesity (BMI 30-39.9) 10/27/2014  ? Changing sleep-work schedule, affecting sleep 10/27/2014  ? Vitamin D deficiency 10/27/2014  ? Arthritis of knee, degenerative 09/24/2009  ? ? ?Past Surgical History:  ?Procedure Laterality Date  ? ABDOMINAL HYSTERECTOMY    ? BREAST BIOPSY Right 01/21/2013  ? stereo - benign  ? COLONOSCOPY WITH PROPOFOL N/A 06/30/2016  ? Procedure:  COLONOSCOPY WITH PROPOFOL;  Surgeon: Jonathon Bellows, MD;  Location: [facility] ENDOSCOPY;  Service: Endoscopy;  Laterality: N/A;  ? KNEE SURGERY Left 2012  ? Dr. Rudene Christians  ? OOPHORECTOMY    ? TUBAL LIGATION    ? VAGINAL PROLAPSE REPAIR    ? ? ?Family History  ?Problem Relation Age of Onset  ? Breast cancer Sister 88  ?     in year 2013  ? Hyperlipidemia Mother   ? Diabetes Mother   ? Cancer Father   ?     Leukemia  ? Hyperlipidemia Father   ? Lung cancer Brother   ? Breast cancer Sister   ? Heart failure Brother   ? ? ?Social History  ? ?Tobacco Use  ? Smoking status: Former  ?  Packs/day: 0.25   ?  Years: 1.00  ?  Pack years: 0.25  ?  Types: Cigarettes  ?  Start date: 08/13/1967  ?  Quit date: 04/18/1977  ?  Years since quitting: 44.2  ? Smokeless tobacco: Never  ? Tobacco comments:  ?  smoking cessation materials not required  ?Substance Use Topics  ? Alcohol use: No  ?  Alcohol/week: 0.0 standard drinks  ? ? ? ?Current Outpatient Medications:  ?  albuterol (VENTOLIN HFA) 108 (90 Base) MCG/ACT inhaler, Inhale 2 puffs into the lungs every 6 (six) hours as needed for wheezing or shortness of breath., Disp: 8 g, Rfl: 6 ?  aspirin EC 81 MG tablet, Take 1 tablet (81 mg total) by mouth daily., Disp: 30 tablet, Rfl: 0 ?  Azelastine-Fluticasone 137-50 MCG/ACT SUSP, Place 1 spray into both nostrils 2 (two) times daily., Disp: , Rfl:  ?  Cholecalciferol (VITAMIN D) 2000 UNITS tablet, Take 1 tablet by mouth daily., Disp: , Rfl:  ?  Dupilumab (DUPIXENT) 300 MG/2ML SOPN, Inject 300 mg into the skin every 14 (fourteen) days., Disp: 12 mL, Rfl: 1 ?  Fluticasone-Umeclidin-Vilant (TRELEGY ELLIPTA) 200-62.5-25 MCG/ACT AEPB, Inhale 1 puff into the lungs daily., Disp: 28 each, Rfl: 11 ?  loratadine (CLARITIN) 10 MG tablet, Take 1 tablet (10 mg total) by mouth daily., Disp: 30 tablet, Rfl: 0 ?  Magnesium 250 MG TABS, Take 1 tablet by mouth daily., Disp: , Rfl:  ?  metaxalone (SKELAXIN) 800 MG tablet, Take 1 tablet (800 mg total) by mouth 3 (three) times daily as needed. for muscle spams, Disp: 90 tablet, Rfl: 0 ?  Multiple Vitamin (MULTIVITAMIN ADULT PO), Take by mouth., Disp: , Rfl:  ?  tolterodine (DETROL LA) 2 MG 24 hr capsule, Take 1 capsule (2 mg total) by mouth daily., Disp: 90 capsule, Rfl: 1 ?  vitamin C (ASCORBIC ACID) 500 MG tablet, Take 1 tablet by mouth daily., Disp: , Rfl:  ?  amLODipine (NORVASC) 10 MG tablet, Take 1 tablet (10 mg total) by mouth daily., Disp: 90 tablet, Rfl: 1 ?  montelukast (SINGULAIR) 10 MG tablet, Take 1 tablet (10 mg total) by mouth at bedtime., Disp: 90 tablet, Rfl: 1 ?  rosuvastatin  (CRESTOR) 10 MG tablet, Take 1 tablet (10 mg total) by mouth daily., Disp: 90 tablet, Rfl: 1 ?  traMADol (ULTRAM) 50 MG tablet, Take 1 tablet (50 mg total) by mouth 2 (two) times daily as needed., Disp: 120 tablet, Rfl: 0 ?  valsartan-hydrochlorothiazide (DIOVAN-HCT) 160-12.5 MG tablet, Take 1 tablet by mouth daily., Disp: 90 tablet, Rfl: 1 ?  zolpidem (AMBIEN CR) 12.5 MG CR tablet, Take 1 tablet (12.5 mg total) by mouth at bedtime., Disp: 90  tablet, Rfl: 0 ? ?No Known Allergies ? ?I personally reviewed active problem list, medication list, allergies, family history, social history, health maintenance with the patient/caregiver today. ? ? ?ROS ? ?Constitutional: Negative for fever or weight change.  ?Respiratory: positive for cough and shortness of breath.   ?Cardiovascular: Negative for chest pain or palpitations.  ?Gastrointestinal: Negative for abdominal pain, no bowel changes.  ?Musculoskeletal: Negative for gait problem or joint swelling.  ?Skin: Negative for rash.  ?Neurological: Negative for dizziness or headache.  ?No other specific complaints in a complete review of systems (except as listed in HPI above).  ? ?Objective ? ?Vitals:  ? 07/22/21 0836  ?BP: 132/74  ?Pulse: 81  ?Resp: 16  ?SpO2: 99%  ?Weight: 174 lb (78.9 kg)  ?Height: '5\' 4"'$  (1.626 m)  ? ? ?Body mass index is 29.87 kg/m?. ? ?Physical Exam ? ?Constitutional: Patient appears well-developed and well-nourished. Overweight.  No distress.  ?HEENT: head atraumatic, normocephalic, pupils equal and reactive to light, neck supple ?Cardiovascular: Normal rate, regular rhythm and normal heart sounds.  No murmur heard. No BLE edema. ?Pulmonary/Chest: Effort normal and breath sounds normal. No respiratory distress. ?Abdominal: Soft.  There is no tenderness. ?Psychiatric: Patient has a normal mood and affect. behavior is normal. Judgment and thought content normal.  ? ?Recent Results (from the past 2160 hour(s))  ?CBC w/Diff     Status: Abnormal  ? Collection  Time: 04/26/21 10:17 AM  ?Result Value Ref Range  ? WBC 8.2 4.0 - 10.5 K/uL  ? RBC 4.33 3.87 - 5.11 MIL/uL  ? Hemoglobin 12.0 12.0 - 15.0 g/dL  ? HCT 35.7 (L) 36.0 - 46.0 %  ? MCV 82.4 80.0 - 100.0 fL  ? MCH

## 2021-07-22 ENCOUNTER — Telehealth: Payer: Self-pay

## 2021-07-22 ENCOUNTER — Ambulatory Visit (INDEPENDENT_AMBULATORY_CARE_PROVIDER_SITE_OTHER): Payer: BC Managed Care – PPO | Admitting: Family Medicine

## 2021-07-22 ENCOUNTER — Encounter: Payer: Self-pay | Admitting: Family Medicine

## 2021-07-22 VITALS — BP 132/74 | HR 81 | Resp 16 | Ht 64.0 in | Wt 174.0 lb

## 2021-07-22 DIAGNOSIS — R739 Hyperglycemia, unspecified: Secondary | ICD-10-CM | POA: Diagnosis not present

## 2021-07-22 DIAGNOSIS — R634 Abnormal weight loss: Secondary | ICD-10-CM | POA: Diagnosis not present

## 2021-07-22 DIAGNOSIS — M170 Bilateral primary osteoarthritis of knee: Secondary | ICD-10-CM

## 2021-07-22 DIAGNOSIS — E7800 Pure hypercholesterolemia, unspecified: Secondary | ICD-10-CM | POA: Diagnosis not present

## 2021-07-22 DIAGNOSIS — J4540 Moderate persistent asthma, uncomplicated: Secondary | ICD-10-CM

## 2021-07-22 DIAGNOSIS — R053 Chronic cough: Secondary | ICD-10-CM

## 2021-07-22 DIAGNOSIS — Z1211 Encounter for screening for malignant neoplasm of colon: Secondary | ICD-10-CM | POA: Diagnosis not present

## 2021-07-22 DIAGNOSIS — I10 Essential (primary) hypertension: Secondary | ICD-10-CM

## 2021-07-22 DIAGNOSIS — D692 Other nonthrombocytopenic purpura: Secondary | ICD-10-CM | POA: Diagnosis not present

## 2021-07-22 DIAGNOSIS — R7303 Prediabetes: Secondary | ICD-10-CM

## 2021-07-22 DIAGNOSIS — J302 Other seasonal allergic rhinitis: Secondary | ICD-10-CM

## 2021-07-22 DIAGNOSIS — F5104 Psychophysiologic insomnia: Secondary | ICD-10-CM

## 2021-07-22 DIAGNOSIS — M5441 Lumbago with sciatica, right side: Secondary | ICD-10-CM

## 2021-07-22 DIAGNOSIS — G8929 Other chronic pain: Secondary | ICD-10-CM

## 2021-07-22 MED ORDER — AMLODIPINE BESYLATE 10 MG PO TABS: 10.0000 mg | ORAL_TABLET | Freq: Every day | ORAL | 1 refills | Status: DC

## 2021-07-22 MED ORDER — TRAMADOL HCL 50 MG PO TABS: 50.0000 mg | ORAL_TABLET | Freq: Two times a day (BID) | ORAL | 0 refills | Status: DC | PRN

## 2021-07-22 MED ORDER — ROSUVASTATIN CALCIUM 10 MG PO TABS: 10.0000 mg | ORAL_TABLET | Freq: Every day | ORAL | 1 refills | Status: DC

## 2021-07-22 MED ORDER — VALSARTAN-HYDROCHLOROTHIAZIDE 160-12.5 MG PO TABS: 1.0000 | ORAL_TABLET | Freq: Every day | ORAL | 1 refills | Status: DC

## 2021-07-22 MED ORDER — ZOLPIDEM TARTRATE ER 12.5 MG PO TBCR: 12.5000 mg | EXTENDED_RELEASE_TABLET | Freq: Every day | ORAL | 0 refills | Status: DC

## 2021-07-22 MED ORDER — MONTELUKAST SODIUM 10 MG PO TABS: 10.0000 mg | ORAL_TABLET | Freq: Every day | ORAL | 1 refills | Status: DC

## 2021-07-22 NOTE — Telephone Encounter (Signed)
CALLED PATIENT NO ANSWER LEFT VOICEMAIL FOR A CALL BACK ? ?

## 2021-07-23 LAB — COMPLETE METABOLIC PANEL WITH GFR
AG Ratio: 1.2 (calc) (ref 1.0–2.5)
ALT: 19 U/L (ref 6–29)
AST: 24 U/L (ref 10–35)
Albumin: 4.1 g/dL (ref 3.6–5.1)
Alkaline phosphatase (APISO): 96 U/L (ref 37–153)
BUN: 25 mg/dL (ref 7–25)
CO2: 27 mmol/L (ref 20–32)
Calcium: 9.9 mg/dL (ref 8.6–10.4)
Chloride: 105 mmol/L (ref 98–110)
Creat: 0.89 mg/dL (ref 0.60–1.00)
Globulin: 3.5 g/dL (calc) (ref 1.9–3.7)
Glucose, Bld: 89 mg/dL (ref 65–99)
Potassium: 4.1 mmol/L (ref 3.5–5.3)
Sodium: 140 mmol/L (ref 135–146)
Total Bilirubin: 0.4 mg/dL (ref 0.2–1.2)
Total Protein: 7.6 g/dL (ref 6.1–8.1)
eGFR: 68 mL/min/{1.73_m2} (ref >=60)

## 2021-07-23 LAB — LIPID PANEL
Cholesterol: 194 mg/dL (ref <200)
HDL: 90 mg/dL (ref >=50)
LDL Cholesterol (Calc): 93 mg/dL (calc)
Non-HDL Cholesterol (Calc): 104 mg/dL (calc) (ref <130)
Total CHOL/HDL Ratio: 2.2 (calc) (ref <5.0)
Triglycerides: 38 mg/dL (ref <150)

## 2021-07-23 LAB — HEMOGLOBIN A1C
Hgb A1c MFr Bld: 6.1 % of total Hgb — ABNORMAL HIGH (ref <5.7)
Mean Plasma Glucose: 128 mg/dL
eAG (mmol/L): 7.1 mmol/L

## 2021-07-23 LAB — TSH: TSH: 0.5 mIU/L (ref 0.40–4.50)

## 2021-07-27 ENCOUNTER — Telehealth: Payer: Self-pay

## 2021-07-27 NOTE — Telephone Encounter (Signed)
CALLED PATIENT NO ANSWER LEFT VOICEMAIL FOR A CALL BACK ? ?

## 2021-07-30 ENCOUNTER — Telehealth: Payer: Self-pay

## 2021-07-30 NOTE — Telephone Encounter (Signed)
CALLED PATIENT NO ANSWER LEFT VOICEMAIL FOR A CALL BACK °Letter sent °

## 2021-08-05 NOTE — Telephone Encounter (Signed)
Patient returned your call. Requesting a call back. Works 3rd shift, best time to call patient is between 8 am and 1pm. ?

## 2021-08-06 ENCOUNTER — Emergency Department: Payer: BC Managed Care – PPO

## 2021-08-06 ENCOUNTER — Other Ambulatory Visit: Payer: Self-pay

## 2021-08-06 ENCOUNTER — Inpatient Hospital Stay
Admission: EM | Admit: 2021-08-06 | Discharge: 2021-08-08 | DRG: 203 | Disposition: A | Discharge status: Home / Self Care | Payer: BC Managed Care – PPO | Attending: Internal Medicine | Admitting: Internal Medicine

## 2021-08-06 ENCOUNTER — Telehealth: Payer: Self-pay

## 2021-08-06 ENCOUNTER — Observation Stay: Payer: BC Managed Care – PPO

## 2021-08-06 DIAGNOSIS — Z66 Do not resuscitate: Secondary | ICD-10-CM | POA: Diagnosis not present

## 2021-08-06 DIAGNOSIS — K5792 Diverticulitis of intestine, part unspecified, without perforation or abscess without bleeding: Secondary | ICD-10-CM | POA: Diagnosis present

## 2021-08-06 DIAGNOSIS — Z7982 Long term (current) use of aspirin: Secondary | ICD-10-CM

## 2021-08-06 DIAGNOSIS — J45901 Unspecified asthma with (acute) exacerbation: Principal | ICD-10-CM | POA: Diagnosis present

## 2021-08-06 DIAGNOSIS — E785 Hyperlipidemia, unspecified: Secondary | ICD-10-CM | POA: Diagnosis not present

## 2021-08-06 DIAGNOSIS — J4520 Mild intermittent asthma, uncomplicated: Secondary | ICD-10-CM | POA: Diagnosis not present

## 2021-08-06 DIAGNOSIS — Z20822 Contact with and (suspected) exposure to covid-19: Secondary | ICD-10-CM | POA: Diagnosis not present

## 2021-08-06 DIAGNOSIS — M47816 Spondylosis without myelopathy or radiculopathy, lumbar region: Secondary | ICD-10-CM | POA: Diagnosis not present

## 2021-08-06 DIAGNOSIS — Z79899 Other long term (current) drug therapy: Secondary | ICD-10-CM

## 2021-08-06 DIAGNOSIS — R1031 Right lower quadrant pain: Principal | ICD-10-CM

## 2021-08-06 DIAGNOSIS — N289 Disorder of kidney and ureter, unspecified: Secondary | ICD-10-CM | POA: Diagnosis not present

## 2021-08-06 DIAGNOSIS — J4521 Mild intermittent asthma with (acute) exacerbation: Secondary | ICD-10-CM | POA: Diagnosis not present

## 2021-08-06 DIAGNOSIS — Z9851 Tubal ligation status: Secondary | ICD-10-CM | POA: Diagnosis not present

## 2021-08-06 DIAGNOSIS — G4700 Insomnia, unspecified: Secondary | ICD-10-CM | POA: Diagnosis present

## 2021-08-06 DIAGNOSIS — Z87891 Personal history of nicotine dependence: Secondary | ICD-10-CM | POA: Diagnosis not present

## 2021-08-06 DIAGNOSIS — G8929 Other chronic pain: Secondary | ICD-10-CM | POA: Diagnosis not present

## 2021-08-06 DIAGNOSIS — K449 Diaphragmatic hernia without obstruction or gangrene: Secondary | ICD-10-CM | POA: Diagnosis not present

## 2021-08-06 DIAGNOSIS — R109 Unspecified abdominal pain: Secondary | ICD-10-CM | POA: Diagnosis present

## 2021-08-06 DIAGNOSIS — N2889 Other specified disorders of kidney and ureter: Secondary | ICD-10-CM | POA: Diagnosis not present

## 2021-08-06 DIAGNOSIS — Z8249 Family history of ischemic heart disease and other diseases of the circulatory system: Secondary | ICD-10-CM | POA: Diagnosis not present

## 2021-08-06 DIAGNOSIS — I10 Essential (primary) hypertension: Secondary | ICD-10-CM | POA: Diagnosis present

## 2021-08-06 DIAGNOSIS — Z83438 Family history of other disorder of lipoprotein metabolism and other lipidemia: Secondary | ICD-10-CM

## 2021-08-06 DIAGNOSIS — R0602 Shortness of breath: Secondary | ICD-10-CM | POA: Diagnosis not present

## 2021-08-06 DIAGNOSIS — N3000 Acute cystitis without hematuria: Secondary | ICD-10-CM

## 2021-08-06 DIAGNOSIS — N390 Urinary tract infection, site not specified: Secondary | ICD-10-CM | POA: Diagnosis present

## 2021-08-06 DIAGNOSIS — M1712 Unilateral primary osteoarthritis, left knee: Secondary | ICD-10-CM | POA: Diagnosis present

## 2021-08-06 DIAGNOSIS — K5790 Diverticulosis of intestine, part unspecified, without perforation or abscess without bleeding: Secondary | ICD-10-CM | POA: Diagnosis not present

## 2021-08-06 DIAGNOSIS — J45909 Unspecified asthma, uncomplicated: Secondary | ICD-10-CM | POA: Diagnosis present

## 2021-08-06 DIAGNOSIS — R079 Chest pain, unspecified: Secondary | ICD-10-CM | POA: Diagnosis not present

## 2021-08-06 LAB — CBC WITH DIFFERENTIAL/PLATELET
Abs Immature Granulocytes: 0.1 10*3/uL — ABNORMAL HIGH (ref 0.00–0.07)
Band Neutrophils: 1 %
Basophils Absolute: 0.1 10*3/uL (ref 0.0–0.1)
Basophils Relative: 1 %
Eosinophils Absolute: 0.1 10*3/uL (ref 0.0–0.5)
Eosinophils Relative: 1 %
HCT: 37.3 % (ref 36.0–46.0)
Hemoglobin: 12.1 g/dL (ref 12.0–15.0)
Lymphocytes Relative: 27 %
Lymphs Abs: 2.5 10*3/uL (ref 0.7–4.0)
MCH: 26.4 pg (ref 26.0–34.0)
MCHC: 32.4 g/dL (ref 30.0–36.0)
MCV: 81.3 fL (ref 80.0–100.0)
Metamyelocytes Relative: 1 %
Monocytes Absolute: 0.5 10*3/uL (ref 0.1–1.0)
Monocytes Relative: 5 %
Neutro Abs: 5.9 10*3/uL (ref 1.7–7.7)
Neutrophils Relative %: 64 %
Platelets: 347 10*3/uL (ref 150–400)
RBC: 4.59 MIL/uL (ref 3.87–5.11)
RDW: 14.8 % (ref 11.5–15.5)
WBC: 9.1 10*3/uL (ref 4.0–10.5)
nRBC: 0 % (ref 0.0–0.2)

## 2021-08-06 LAB — RESP PANEL BY RT-PCR (FLU A&B, COVID) ARPGX2
Influenza A by PCR: NEGATIVE
Influenza B by PCR: NEGATIVE
SARS Coronavirus 2 by RT PCR: NEGATIVE

## 2021-08-06 LAB — URINALYSIS, ROUTINE W REFLEX MICROSCOPIC
Bilirubin Urine: NEGATIVE
Glucose, UA: NEGATIVE mg/dL
Hgb urine dipstick: NEGATIVE
Ketones, ur: 5 mg/dL — AB
Leukocytes,Ua: NEGATIVE
Nitrite: NEGATIVE
Protein, ur: >=300 mg/dL — AB
Specific Gravity, Urine: 1.019 (ref 1.005–1.030)
pH: 8 (ref 5.0–8.0)

## 2021-08-06 LAB — COMPREHENSIVE METABOLIC PANEL
ALT: 20 U/L (ref 0–44)
AST: 26 U/L (ref 15–41)
Albumin: 4 g/dL (ref 3.5–5.0)
Alkaline Phosphatase: 119 U/L (ref 38–126)
Anion gap: 12 (ref 5–15)
BUN: 16 mg/dL (ref 8–23)
CO2: 24 mmol/L (ref 22–32)
Calcium: 9.8 mg/dL (ref 8.9–10.3)
Chloride: 99 mmol/L (ref 98–111)
Creatinine, Ser: 0.79 mg/dL (ref 0.44–1.00)
GFR, Estimated: >60 mL/min (ref >60)
Glucose, Bld: 122 mg/dL — ABNORMAL HIGH (ref 70–99)
Potassium: 3.5 mmol/L (ref 3.5–5.1)
Sodium: 135 mmol/L (ref 135–145)
Total Bilirubin: 0.8 mg/dL (ref 0.3–1.2)
Total Protein: 8.9 g/dL — ABNORMAL HIGH (ref 6.5–8.1)

## 2021-08-06 LAB — CBC
HCT: 39.4 % (ref 36.0–46.0)
Hemoglobin: 12.8 g/dL (ref 12.0–15.0)
MCH: 26.2 pg (ref 26.0–34.0)
MCHC: 32.5 g/dL (ref 30.0–36.0)
MCV: 80.6 fL (ref 80.0–100.0)
Platelets: 374 10*3/uL (ref 150–400)
RBC: 4.89 MIL/uL (ref 3.87–5.11)
RDW: 14.8 % (ref 11.5–15.5)
WBC: 9.1 10*3/uL (ref 4.0–10.5)
nRBC: 0 % (ref 0.0–0.2)

## 2021-08-06 LAB — LACTIC ACID, PLASMA: Lactic Acid, Venous: 0.9 mmol/L (ref 0.5–1.9)

## 2021-08-06 LAB — PROTIME-INR
INR: 1 (ref 0.8–1.2)
Prothrombin Time: 13.4 seconds (ref 11.4–15.2)

## 2021-08-06 LAB — APTT: aPTT: 30 seconds (ref 24–36)

## 2021-08-06 LAB — LIPASE, BLOOD: Lipase: 38 U/L (ref 11–51)

## 2021-08-06 LAB — PROCALCITONIN: Procalcitonin: <0.1 ng/mL

## 2021-08-06 MED ORDER — FESOTERODINE FUMARATE ER 4 MG PO TB24
4.0000 mg | ORAL_TABLET | Freq: Every day | ORAL | Status: DC
  Administered 2021-08-06 – 2021-08-08 (×3): 4 mg via ORAL
  Filled 2021-08-06 (×3): qty 1

## 2021-08-06 MED ORDER — VITAMIN D 25 MCG (1000 UNIT) PO TABS
2000.0000 [IU] | ORAL_TABLET | Freq: Every day | ORAL | Status: DC
  Administered 2021-08-07 – 2021-08-08 (×2): 2000 [IU] via ORAL
  Filled 2021-08-06 (×2): qty 2

## 2021-08-06 MED ORDER — SODIUM CHLORIDE 0.9 % IV BOLUS
1000.0000 mL | Freq: Once | INTRAVENOUS | Status: AC
  Administered 2021-08-06: 1000 mL via INTRAVENOUS

## 2021-08-06 MED ORDER — MORPHINE SULFATE (PF) 2 MG/ML IV SOLN
1.0000 mg | INTRAVENOUS | Status: DC | PRN
  Administered 2021-08-06: 1 mg via INTRAVENOUS
  Filled 2021-08-06: qty 1

## 2021-08-06 MED ORDER — METAXALONE 800 MG PO TABS
800.0000 mg | ORAL_TABLET | Freq: Three times a day (TID) | ORAL | Status: DC | PRN
  Filled 2021-08-06: qty 1

## 2021-08-06 MED ORDER — MONTELUKAST SODIUM 10 MG PO TABS
10.0000 mg | ORAL_TABLET | Freq: Every day | ORAL | Status: DC
  Administered 2021-08-06 – 2021-08-07 (×2): 10 mg via ORAL
  Filled 2021-08-06 (×2): qty 1

## 2021-08-06 MED ORDER — ASPIRIN EC 81 MG PO TBEC
81.0000 mg | DELAYED_RELEASE_TABLET | Freq: Every day | ORAL | Status: DC
  Administered 2021-08-06 – 2021-08-08 (×3): 81 mg via ORAL
  Filled 2021-08-06 (×3): qty 1

## 2021-08-06 MED ORDER — ASCORBIC ACID 500 MG PO TABS
500.0000 mg | ORAL_TABLET | Freq: Every day | ORAL | Status: DC
  Administered 2021-08-06 – 2021-08-08 (×3): 500 mg via ORAL
  Filled 2021-08-06 (×3): qty 1

## 2021-08-06 MED ORDER — CEFTRIAXONE SODIUM 2 G IJ SOLR
2.0000 g | Freq: Every day | INTRAMUSCULAR | Status: DC
  Administered 2021-08-06: 2 g via INTRAVENOUS
  Filled 2021-08-06 (×2): qty 20

## 2021-08-06 MED ORDER — HEPARIN SODIUM (PORCINE) 5000 UNIT/ML IJ SOLN
5000.0000 [IU] | Freq: Three times a day (TID) | INTRAMUSCULAR | Status: DC
  Administered 2021-08-06 – 2021-08-08 (×5): 5000 [IU] via SUBCUTANEOUS
  Filled 2021-08-06 (×5): qty 1

## 2021-08-06 MED ORDER — ZOLPIDEM TARTRATE 5 MG PO TABS: 5.0000 mg | ORAL_TABLET | Freq: Every evening | ORAL | Status: DC | PRN

## 2021-08-06 MED ORDER — ADULT MULTIVITAMIN W/MINERALS CH
1.0000 | ORAL_TABLET | Freq: Every day | ORAL | Status: DC
  Administered 2021-08-06 – 2021-08-07 (×2): 1 via ORAL
  Filled 2021-08-06 (×2): qty 1

## 2021-08-06 MED ORDER — SODIUM CHLORIDE 0.9 % IV SOLN: INTRAVENOUS | Status: DC

## 2021-08-06 MED ORDER — MAGNESIUM OXIDE -MG SUPPLEMENT 400 (240 MG) MG PO TABS
200.0000 mg | ORAL_TABLET | Freq: Every day | ORAL | Status: DC
  Administered 2021-08-06 – 2021-08-08 (×3): 200 mg via ORAL
  Filled 2021-08-06 (×3): qty 1

## 2021-08-06 MED ORDER — TRAMADOL HCL 50 MG PO TABS
50.0000 mg | ORAL_TABLET | Freq: Two times a day (BID) | ORAL | Status: DC | PRN
  Administered 2021-08-07: 50 mg via ORAL
  Filled 2021-08-06: qty 1

## 2021-08-06 MED ORDER — ROSUVASTATIN CALCIUM 10 MG PO TABS
10.0000 mg | ORAL_TABLET | Freq: Every day | ORAL | Status: DC
  Administered 2021-08-06 – 2021-08-08 (×3): 10 mg via ORAL
  Filled 2021-08-06 (×3): qty 1

## 2021-08-06 MED ORDER — ALBUTEROL SULFATE HFA 108 (90 BASE) MCG/ACT IN AERS
2.0000 | INHALATION_SPRAY | RESPIRATORY_TRACT | Status: DC | PRN
Start: 2021-08-06 — End: 2021-08-06

## 2021-08-06 MED ORDER — ONDANSETRON HCL 4 MG/2ML IJ SOLN
4.0000 mg | Freq: Once | INTRAMUSCULAR | Status: AC
Start: 2021-08-06 — End: 2021-08-06
  Administered 2021-08-06: 4 mg via INTRAVENOUS
  Filled 2021-08-06: qty 2

## 2021-08-06 MED ORDER — IPRATROPIUM-ALBUTEROL 0.5-2.5 (3) MG/3ML IN SOLN
3.0000 mL | RESPIRATORY_TRACT | Status: DC
  Administered 2021-08-06 – 2021-08-07 (×5): 3 mL via RESPIRATORY_TRACT
  Filled 2021-08-06 (×5): qty 3

## 2021-08-06 MED ORDER — MAGNESIUM 250 MG PO TABS: 1.0000 | ORAL_TABLET | Freq: Every day | ORAL | Status: DC

## 2021-08-06 MED ORDER — MORPHINE SULFATE (PF) 4 MG/ML IV SOLN
4.0000 mg | Freq: Once | INTRAVENOUS | Status: AC
  Administered 2021-08-06: 4 mg via INTRAVENOUS
  Filled 2021-08-06: qty 1

## 2021-08-06 MED ORDER — HYDRALAZINE HCL 20 MG/ML IJ SOLN: 5.0000 mg | INTRAMUSCULAR | Status: DC | PRN

## 2021-08-06 MED ORDER — ALBUTEROL SULFATE (2.5 MG/3ML) 0.083% IN NEBU: 3.0000 mL | INHALATION_SOLUTION | RESPIRATORY_TRACT | Status: DC | PRN

## 2021-08-06 MED ORDER — AMLODIPINE BESYLATE 10 MG PO TABS
10.0000 mg | ORAL_TABLET | Freq: Every day | ORAL | Status: DC
  Administered 2021-08-06 – 2021-08-08 (×3): 10 mg via ORAL
  Filled 2021-08-06: qty 1
  Filled 2021-08-06: qty 2
  Filled 2021-08-06: qty 1

## 2021-08-06 MED ORDER — ACETAMINOPHEN 325 MG PO TABS
650.0000 mg | ORAL_TABLET | Freq: Four times a day (QID) | ORAL | Status: DC | PRN
Start: 2021-08-06 — End: 2021-08-08

## 2021-08-06 MED ORDER — DM-GUAIFENESIN ER 30-600 MG PO TB12: 1.0000 | ORAL_TABLET | Freq: Two times a day (BID) | ORAL | Status: DC | PRN

## 2021-08-06 MED ORDER — ONDANSETRON HCL 4 MG/2ML IJ SOLN: 4.0000 mg | Freq: Three times a day (TID) | INTRAMUSCULAR | Status: DC | PRN

## 2021-08-06 MED ORDER — LORATADINE 10 MG PO TABS
10.0000 mg | ORAL_TABLET | Freq: Every day | ORAL | Status: DC
  Administered 2021-08-06 – 2021-08-08 (×3): 10 mg via ORAL
  Filled 2021-08-06 (×3): qty 1

## 2021-08-06 MED ORDER — METRONIDAZOLE 500 MG/100ML IV SOLN
500.0000 mg | Freq: Two times a day (BID) | INTRAVENOUS | Status: DC
Start: 2021-08-06 — End: 2021-08-07
  Administered 2021-08-06 (×2): 500 mg via INTRAVENOUS
  Filled 2021-08-06 (×2): qty 100

## 2021-08-06 NOTE — ED Triage Notes (Signed)
Pt to ED via POV from [facility]. Pt reports RLQ and flank pain x2-3 days that got worse last night. Pt reports N/V/D. Pt denies fever, CP or SOB. Pt WOB appears increased due to pain in triage.  ?

## 2021-08-06 NOTE — Assessment & Plan Note (Signed)
Abdominal pain and sepsis due to possible diverticulitis and UTI: CT scan for renal stone protocol is negative for kidney stone, but showed possible sigmoid diverticulitis.  Patient has a sepsis with heart rate 91, RR 36. Lactic acid level 0.9.  General surgeon, Dr. Lysle Pearl is consulted. ? ?-will admitted to [facility] bed as inpatient ?-Started Rocephin and Flagyl empirically ?-Blood culture ?-IV morphine and Zofran ?-will get Procalcitonin ?-IVF: 1L of NS bolus in ED, followed by 75 cc/h.  ? ?

## 2021-08-06 NOTE — Assessment & Plan Note (Signed)
This is incidental findings by CT scan ?- Follow-up with PCP for possible outpatient MRI ?

## 2021-08-06 NOTE — ED Notes (Signed)
Pt sleeping with o2 sat 88%. Pt placed on 2 L [place] ?

## 2021-08-06 NOTE — H&P (Signed)
?History and Physical  ? ? ?Hayley Howe BHA:[id number] DOB: 01/24/1946 DOA: 08/06/2021 ? ?Referring MD/NP/PA:  ? ?PCP: Steele Sizer, MD  ? ?Patient coming from:  The patient is coming from home.  At baseline, pt is independent for most of ADL.       ? ?Chief Complaint: abdominal pain ? ?HPI: Hayley Howe is a 76 y.o. female with medical history significant of asthma, hypertension, hyperlipidemia, chronic back pain, who presents with abdominal pain. ? ?Patient states that she has abdominal pain for almost 3 days, which is located in right lower quadrant, sharp, severe, radiating to the right flank area and back.  Associated with nausea and 4 times of nonbilious nonbloody vomiting.  Patient denies diarrhea.  No fever or chills.  Patient also reports increased urinary frequency, no dysuria or burning on urination.  She reports shortness of breath and cough with yellow-colored mucus production.  She was found to have oxygen desaturating to 87% on room air, which improved to 100% on 2 L oxygen.  Patient is not using oxygen normally. ? ?Data Reviewed and ED Course: pt was found to have WBC 9.1, lipase 38, normal liver function, urinalysis (hazy appearance, negative leukocyte, rare bacteria, WBC 11-20), GFR> 60, temperature normal, blood pressure 158/79, heart rate 91, 36, chest x-ray negative.  CT- renal stone protocol showed possible sigmoid diverticulitis.  Patient is placed on MedSurg bed for observation. Dr. Lysle Pearl of surgery is consulted. ? ?CT scan per renal stone protocol: ?1. Mild fat stranding is seen adjacent to a sigmoid diverticula, ?possibly early acute diverticulitis. ?2. No evidence of appendicitis or obstructive uropathy. ?3. Indeterminate peripherally calcified lesion of the lower pole of ?the right kidney measuring 2.0 x 1.5 cm. Recommend renal protocol ?MRI for further evaluation, which can be performed non emergently. ?4.  Aortic Atherosclerosis (ICD10-I70.0). ? ?EKG: INot done in ED, will  get one.   ? ? ?Review of Systems:  ? ?General: no fevers, chills, no body weight gain, has poor appetite, has fatigue ?HEENT: no blurry vision, hearing changes or sore throat ?Respiratory: has dyspnea, coughing, no wheezing ?CV: no chest pain, no palpitations ?GI: has nausea, vomiting, abdominal pain, no diarrhea, constipation ?GU: no dysuria, burning on urination, increased urinary frequency, hematuria  ?Ext: has trace leg edema ?Neuro: no unilateral weakness, numbness, or tingling, no vision change or hearing loss ?Skin: no rash, no skin tear. ?MSK: No muscle spasm, no deformity, no limitation of range of movement in spin. Has right sided back pain and flank pain ?Heme: No easy bruising.  ?Travel history: No recent long distant travel. ? ? ?Allergy: No Known Allergies ? ?Past Medical History:  ?Diagnosis Date  ? Hyperlipidemia   ? Hypertension   ? Insomnia   ? Low back pain   ? Obesity   ? Osteoarthritis of left knee   ? ? ?Past Surgical History:  ?Procedure Laterality Date  ? ABDOMINAL HYSTERECTOMY    ? BREAST BIOPSY Right 01/21/2013  ? stereo - benign  ? COLONOSCOPY WITH PROPOFOL N/A 06/30/2016  ? Procedure: COLONOSCOPY WITH PROPOFOL;  Surgeon: Jonathon Bellows, MD;  Location: [facility] ENDOSCOPY;  Service: Endoscopy;  Laterality: N/A;  ? KNEE SURGERY Left 2012  ? Dr. Rudene Christians  ? OOPHORECTOMY    ? TUBAL LIGATION    ? VAGINAL PROLAPSE REPAIR    ? ? ?Social History:  reports that she quit smoking about 44 years ago. Her smoking use included cigarettes. She started smoking about 54 years ago. She has  a 0.25 pack-year smoking history. She has never used smokeless tobacco. She reports that she does not drink alcohol and does not use drugs. ? ?Family History:  ?Family History  ?Problem Relation Age of Onset  ? Breast cancer Sister 26  ?     in year 2013  ? Hyperlipidemia Mother   ? Diabetes Mother   ? Cancer Father   ?     Leukemia  ? Hyperlipidemia Father   ? Lung cancer Brother   ? Breast cancer Sister   ? Heart failure Brother   ?   ? ?Prior to Admission medications   ?Medication Sig Start Date End Date Taking? Authorizing Provider  ?albuterol (VENTOLIN HFA) 108 (90 Base) MCG/ACT inhaler Inhale 2 puffs into the lungs every 6 (six) hours as needed for wheezing or shortness of breath. 07/30/20   Tyler Pita, MD  ?amLODipine (NORVASC) 10 MG tablet Take 1 tablet (10 mg total) by mouth daily. 07/22/21   Steele Sizer, MD  ?aspirin EC 81 MG tablet Take 1 tablet (81 mg total) by mouth daily. 01/19/18   Steele Sizer, MD  ?Azelastine-Fluticasone 137-50 MCG/ACT SUSP Place 1 spray into both nostrils 2 (two) times daily. 04/17/20   [provider]  ?Cholecalciferol (VITAMIN D) 2000 UNITS tablet Take 1 tablet by mouth daily. 02/11/11   [provider]  ?Dupilumab (DUPIXENT) 300 MG/2ML SOPN Inject 300 mg into the skin every 14 (fourteen) days. 07/09/21   Tyler Pita, MD  ?Fluticasone-Umeclidin-Vilant (TRELEGY ELLIPTA) 200-62.5-25 MCG/ACT AEPB Inhale 1 puff into the lungs daily. 06/14/21   Tyler Pita, MD  ?loratadine (CLARITIN) 10 MG tablet Take 1 tablet (10 mg total) by mouth daily. 06/08/18   Fredderick Severance, NP  ?Magnesium 250 MG TABS Take 1 tablet by mouth daily.    [provider]  ?metaxalone (SKELAXIN) 800 MG tablet Take 1 tablet (800 mg total) by mouth 3 (three) times daily as needed. for muscle spams 09/05/19   Steele Sizer, MD  ?montelukast (SINGULAIR) 10 MG tablet Take 1 tablet (10 mg total) by mouth at bedtime. 07/22/21   Steele Sizer, MD  ?Multiple Vitamin (MULTIVITAMIN ADULT PO) Take by mouth.    [provider]  ?rosuvastatin (CRESTOR) 10 MG tablet Take 1 tablet (10 mg total) by mouth daily. 07/22/21   Steele Sizer, MD  ?tolterodine (DETROL LA) 2 MG 24 hr capsule Take 1 capsule (2 mg total) by mouth daily. 01/09/20   Steele Sizer, MD  ?traMADol (ULTRAM) 50 MG tablet Take 1 tablet (50 mg total) by mouth 2 (two) times daily as needed. 07/22/21   Steele Sizer, MD   ?valsartan-hydrochlorothiazide (DIOVAN-HCT) 160-12.5 MG tablet Take 1 tablet by mouth daily. 07/22/21   Steele Sizer, MD  ?vitamin C (ASCORBIC ACID) 500 MG tablet Take 1 tablet by mouth daily.    [provider]  ?zolpidem (AMBIEN CR) 12.5 MG CR tablet Take 1 tablet (12.5 mg total) by mouth at bedtime. 07/22/21   Steele Sizer, MD  ? ? ?Physical Exam: ?Vitals:  ? 08/06/21 1030 08/06/21 1045 08/06/21 1049 08/06/21 1100  ?BP: (!) 158/79   (!) 164/85  ?Pulse: 77 82 83 77  ?Resp:    20  ?Temp:      ?TempSrc:      ?SpO2: 93% (!) 87% 100% 100%  ?Weight:      ?Height:      ? ?General: Not in acute distress ?HEENT: ?      Eyes: PERRL, EOMI, no  scleral icterus. ?      ENT: No discharge from the ears and nose, no pharynx injection, no tonsillar enlargement.  ?      Neck: No JVD, no bruit, no mass felt. ?Heme: No neck lymph node enlargement. ?Cardiac: S1/S2, RRR, No murmurs, No gallops or rubs. ?Respiratory: Has mildly decreased air movement bilaterally, no rales, wheezing, rhonchi or rubs. ?GI: Soft, nondistended, has tenderness to the right lower quadrant, no rebound pain, no organomegaly, BS present. ?GU: No hematuria ?Ext: has trace leg edema bilaterally. 1+DP/PT pulse bilaterally. ?Musculoskeletal: No joint deformities, No joint redness or warmth, no limitation of ROM in spin. ?Skin: No rashes.  ?Neuro: Alert, oriented X3, cranial nerves II-XII grossly intact, moves all extremities normally.  ?Psych: Patient is not psychotic, no suicidal or hemocidal ideation. ? ?Labs on Admission: I have personally reviewed following labs and imaging studies ? ?CBC: ?Recent Labs  ?Lab 08/06/21 ?3825 08/06/21 ?1242  ?WBC 9.1 9.1  ?NEUTROABS  --  5.9  ?HGB 12.8 12.1  ?HCT 39.4 37.3  ?MCV 80.6 81.3  ?PLT 374 347  ? ?Basic Metabolic Panel: ?Recent Labs  ?Lab 08/06/21 ?0815  ?NA 135  ?K 3.5  ?CL 99  ?CO2 24  ?GLUCOSE 122*  ?BUN 16  ?CREATININE 0.79  ?CALCIUM 9.8  ? ?GFR: ?Estimated Creatinine Clearance: 62.7 mL/min (by C-G formula  based on SCr of 0.79 mg/dL). ?Liver Function Tests: ?Recent Labs  ?Lab 08/06/21 ?0815  ?AST 26  ?ALT 20  ?ALKPHOS 119  ?BILITOT 0.8  ?PROT 8.9*  ?ALBUMIN 4.0  ? ?Recent Labs  ?Lab 08/06/21 ?0815  ?LIPASE 38  ? ?No res

## 2021-08-06 NOTE — Telephone Encounter (Signed)
CALLED PATIENT NO ANSWER LEFT VOICEMAIL FOR A CALL BACK ? ?

## 2021-08-06 NOTE — Assessment & Plan Note (Signed)
-   IV hydralazine as needed ?-Amlodipine ?-Hold Diovan-HCTZ since patient need IV fluid ?

## 2021-08-06 NOTE — Consult Note (Signed)
Subjective:  ? ?CC: Right lower quadrant and flank pain ? ?HPI: ? Hayley Howe is a 76 y.o. female who is consulted by [facility] for evaluation of  above cc.  Symptoms were first noted 2 days ago. Pain is sharp, constant, localized to right lower quadrant radiating around to right flank.  Pain onset was sudden, always localized to right lower quadrant.  Associated with anorexia, nausea and vomiting started this morning, exacerbated by nothing specific.  ? ?   ?Past Medical History:  has a past medical history of Hyperlipidemia, Hypertension, Insomnia, Low back pain, Obesity, and Osteoarthritis of left knee. ? ?Past Surgical History:  has a past surgical history that includes Breast biopsy (Right, 01/21/2013); Tubal ligation; Abdominal hysterectomy; Vaginal prolapse repair; Knee surgery (Left, 2012); Colonoscopy with propofol (N/A, 06/30/2016); and Oophorectomy. ? ?Family History: family history includes Breast cancer in her sister; Breast cancer (age of onset: 40) in her sister; Cancer in her father; Diabetes in her mother; Heart failure in her brother; Hyperlipidemia in her father and mother; Lung cancer in her brother. ? ?Social History:  reports that she quit smoking about 44 years ago. Her smoking use included cigarettes. She started smoking about 54 years ago. She has a 0.25 pack-year smoking history. She has never used smokeless tobacco. She reports that she does not drink alcohol and does not use drugs. ? ?Current Medications:  ?Prior to Admission medications   ?Medication Sig Start Date End Date Taking? Authorizing Provider  ?amLODipine (NORVASC) 10 MG tablet Take 1 tablet (10 mg total) by mouth daily. 07/22/21  Yes Steele Sizer, MD  ?aspirin EC 81 MG tablet Take 1 tablet (81 mg total) by mouth daily. 01/19/18  Yes Sowles, Drue Stager, MD  ?Azelastine-Fluticasone 137-50 MCG/ACT SUSP Place 1 spray into both nostrils 2 (two) times daily. 04/17/20  Yes [provider]  ?Cholecalciferol (VITAMIN D) 2000  UNITS tablet Take 1 tablet by mouth daily. 02/11/11  Yes [provider]  ?Fluticasone-Umeclidin-Vilant (TRELEGY ELLIPTA) 200-62.5-25 MCG/ACT AEPB Inhale 1 puff into the lungs daily. 06/14/21  Yes Tyler Pita, MD  ?loratadine (CLARITIN) 10 MG tablet Take 1 tablet (10 mg total) by mouth daily. 06/08/18  Yes Poulose, Bethel Born, NP  ?Magnesium 250 MG TABS Take 1 tablet by mouth daily.   Yes [provider]  ?montelukast (SINGULAIR) 10 MG tablet Take 1 tablet (10 mg total) by mouth at bedtime. 07/22/21  Yes Sowles, Drue Stager, MD  ?Multiple Vitamin (MULTIVITAMIN ADULT PO) Take by mouth.   Yes [provider]  ?rosuvastatin (CRESTOR) 10 MG tablet Take 1 tablet (10 mg total) by mouth daily. 07/22/21  Yes Sowles, Drue Stager, MD  ?tolterodine (DETROL LA) 2 MG 24 hr capsule Take 1 capsule (2 mg total) by mouth daily. 01/09/20  Yes Sowles, Drue Stager, MD  ?valsartan-hydrochlorothiazide (DIOVAN-HCT) 160-12.5 MG tablet Take 1 tablet by mouth daily. 07/22/21  Yes Sowles, Drue Stager, MD  ?vitamin C (ASCORBIC ACID) 500 MG tablet Take 1 tablet by mouth daily.   Yes [provider]  ?zolpidem (AMBIEN CR) 12.5 MG CR tablet Take 1 tablet (12.5 mg total) by mouth at bedtime. 07/22/21  Yes Sowles, Drue Stager, MD  ?albuterol (VENTOLIN HFA) 108 (90 Base) MCG/ACT inhaler Inhale 2 puffs into the lungs every 6 (six) hours as needed for wheezing or shortness of breath. 07/30/20   Tyler Pita, MD  ?Dupilumab (DUPIXENT) 300 MG/2ML SOPN Inject 300 mg into the skin every 14 (fourteen) days. 07/09/21   Tyler Pita, MD  ?metaxalone (  SKELAXIN) 800 MG tablet Take 1 tablet (800 mg total) by mouth 3 (three) times daily as needed. for muscle spams 09/05/19   Steele Sizer, MD  ?traMADol (ULTRAM) 50 MG tablet Take 1 tablet (50 mg total) by mouth 2 (two) times daily as needed. 07/22/21   Steele Sizer, MD  ? ? ?Allergies:  ?Allergies as of 08/06/2021  ? (No Known Allergies)  ? ? ?ROS:  ?General: Denies weight loss, weight  gain, fatigue, fevers, chills, and night sweats. ?Eyes: Denies blurry vision, double vision, eye pain, itchy eyes, and tearing. ?Ears: Denies hearing loss, earache, and ringing in ears. ?Nose: Denies sinus pain, congestion, infections, runny nose, and nosebleeds. ?Mouth/throat: Denies hoarseness, sore throat, bleeding gums, and difficulty swallowing. ?Heart: Denies chest pain, palpitations, racing heart, irregular heartbeat, leg pain or swelling, and decreased activity tolerance. ?Respiratory: Denies breathing difficulty, shortness of breath, wheezing, cough, and sputum. ?GI: Denies change in appetite, heartburn, nausea, vomiting, constipation, diarrhea, and blood in stool. ?GU: Denies difficulty urinating, pain with urinating, urgency, frequency, blood in urine. ?Musculoskeletal: Denies joint stiffness, pain, swelling, muscle weakness. ?Skin: Denies rash, itching, mass, tumors, sores, and boils ?Neurologic: Denies headache, fainting, dizziness, seizures, numbness, and tingling. ?Psychiatric: Denies depression, anxiety, difficulty sleeping, and memory loss. ?Endocrine: Denies heat or cold intolerance, and increased thirst or urination. ?Blood/lymph: Denies easy bruising, easy bruising, and swollen glands ? ? ?  ?Objective:  ?  ? ?BP (!) 164/85   Pulse 77   Temp 98.4 ?F (36.9 ?C) (Oral)   Resp 20   Ht '5\' 4"'$  (1.626 m)   Wt 78.9 kg   SpO2 100%   BMI 29.87 kg/m?  ? ? ?Constitutional :  alert, cooperative, appears stated age, and mild distress  ?Lymphatics/Throat:  no asymmetry, masses, or scars  ?Respiratory:  clear to auscultation bilaterally  ?Cardiovascular:  regular rate and rhythm  ?Gastrointestinal: Soft, TTP in RLQ, and flank, minimal, no guarding .   ?Musculoskeletal: Steady gait and movement  ?Skin: Cool and moist, umbilical port site? surgical scars  ?Psychiatric: Normal affect, non-agitated, not confused  ?   ?  ?LABS:  ? ?  Latest Ref Rng & Units 08/06/2021  ?  8:15 AM 07/22/2021  ?  9:29 AM 09/17/2020  ?  10:07 AM  ?CMP  ?Glucose 70 - 99 mg/dL 122   89   80    ?BUN 8 - 23 mg/dL '16   25   18    '$ ?Creatinine 0.44 - 1.00 mg/dL 0.79   0.89   0.84    ?Sodium 135 - 145 mmol/L 135   140   139    ?Potassium 3.5 - 5.1 mmol/L 3.5   4.1   4.5    ?Chloride 98 - 111 mmol/L 99   105   103    ?CO2 22 - 32 mmol/L '24   27   30    '$ ?Calcium 8.9 - 10.3 mg/dL 9.8   9.9   9.6    ?Total Protein 6.5 - 8.1 g/dL 8.9   7.6   7.6    ?Total Bilirubin 0.3 - 1.2 mg/dL 0.8   0.4   0.4    ?Alkaline Phos 38 - 126 U/L 119      ?AST 15 - 41 U/L '26   24   27    '$ ?ALT 0 - 44 U/L '20   19   24    '$ ? ? ?  Latest Ref Rng & Units 08/06/2021  ? 12:42  PM 08/06/2021  ?  8:15 AM 04/26/2021  ? 10:17 AM  ?CBC  ?WBC 4.0 - 10.5 K/uL 9.1   9.1   8.2    ?Hemoglobin 12.0 - 15.0 g/dL 12.1   12.8   12.0    ?Hematocrit 36.0 - 46.0 % 37.3   39.4   35.7    ?Platelets 150 - 400 K/uL 347   374   228    ? ? ? ?RADS: ?CLINICAL DATA:  Right flank pain ?  ?EXAM: ?CT ABDOMEN AND PELVIS WITHOUT CONTRAST ?  ?TECHNIQUE: ?Multidetector CT imaging of the abdomen and pelvis was performed ?following the standard protocol without IV contrast. ?  ?RADIATION DOSE REDUCTION: This exam was performed according to the ?departmental dose-optimization program which includes automated ?exposure control, adjustment of the mA and/or kV according to ?patient size and/or use of iterative reconstruction technique. ?  ?COMPARISON:  None. ?  ?FINDINGS: ?Lower chest: Left basilar atelectasis.  No acute abnormality. ?  ?Hepatobiliary: No focal liver abnormality is seen. No gallstones, ?gallbladder wall thickening, or biliary dilatation. ?  ?Pancreas: Unremarkable. No pancreatic ductal dilatation or ?surrounding inflammatory changes. ?  ?Spleen: Normal in size without focal abnormality. ?  ?Adrenals/Urinary Tract: Bilateral adrenal glands are unremarkable. ?No hydronephrosis or nephrolithiasis. Simple appearing cyst of the ?mid/upper pole of the left kidney, no further follow-up recommended ?for this lesion.  Peripherally calcified lesion of the lower pole of ?the right kidney measuring 2.0 x 1.5 cm. Bladder is unremarkable. ?  ?Stomach/Bowel: Small hiatal hernia. Stomach is otherwise within ?normal limits. Appendix appea

## 2021-08-06 NOTE — ED Notes (Signed)
Dr Lysle Pearl at bedside.  ?

## 2021-08-06 NOTE — Assessment & Plan Note (Signed)
-   Crestor 

## 2021-08-06 NOTE — Assessment & Plan Note (Signed)
-   Rocephin ?-Follow-up urine culture ?

## 2021-08-06 NOTE — Assessment & Plan Note (Signed)
See above

## 2021-08-06 NOTE — Assessment & Plan Note (Addendum)
Patient has severe asthma.  Patient has mildly decreased air movement bilaterally, but patient does not have wheezing or rhonchi on auscultation.  Patient is getting Dupixent injection every 14 days, last injection was on 4/12 per patient.  Now patient has oxygen desaturation to 87%, requiring 2 L oxygen.  Chest x-ray negative.  No chest pain, low suspicions of PE.  No history of CHF. ?-Bronchodilators, Singulair ?-As needed Mucinex ?-Nasal cannula oxygen to maintain oxygen saturation above 93% ?

## 2021-08-06 NOTE — ED Notes (Signed)
Patient transported to CT 

## 2021-08-06 NOTE — ED Notes (Signed)
Patient room air sats dropping to 88% while sleeping, room air sats return to 98-100% when awake. Placed on 2L via [place] to rest. Sats remain at 98-100%. ?

## 2021-08-06 NOTE — Plan of Care (Signed)
Patient admitted to floor. AXOx4.  Call bell within reach, bed lowest position, and locked.  Patient verbalized understanding to call for assistance when needed. IVF infusing. ?

## 2021-08-06 NOTE — ED Provider Notes (Signed)
? ?[facility] ?Provider Note ? ? ? Event Date/Time  ? First MD Initiated Contact with Patient 08/06/21 0818   ?  (approximate) ? ? ?History  ? ?Abdominal Pain (Right Flank) ? ? ?HPI ? ?Hayley Howe is a 76 y.o. female with a history of asthma who complains of about 3 days of right flank pain and right lower quadrant pain.  Pain radiates from the upper mid back around to the right lower quadrant.  Patient got worse last night.  She reports nausea and vomiting to me nausea vomiting and diarrhea to the nurse.  She is breathing hard and fast because of the pain. ? ?  ? ? ?Physical Exam  ? ?Triage Vital Signs: ?ED Triage Vitals  ?Enc Vitals Group  ?   BP 08/06/21 0811 (!) 153/86  ?   Pulse Rate 08/06/21 0811 91  ?   Resp 08/06/21 0811 18  ?   Temp 08/06/21 0811 98.4 ?F (36.9 ?C)  ?   Temp Source 08/06/21 0811 Oral  ?   SpO2 08/06/21 0811 99 %  ?   Weight 08/06/21 0810 174 lb (78.9 kg)  ?   Height 08/06/21 0810 '5\' 4"'$  (1.626 m)  ?   Head Circumference --   ?   Peak Flow --   ?   Pain Score 08/06/21 0810 10  ?   Pain Loc --   ?   Pain Edu? --   ?   Excl. in [place]? --   ? ? ?Most recent vital signs: ?Vitals:  ? 08/06/21 1049 08/06/21 1100  ?BP:  (!) 164/85  ?Pulse: 83 77  ?Resp:  20  ?Temp:    ?SpO2: 100% 100%  ? ? ?General: Awake, alert, looks uncomfortable ?CV:  Good peripheral perfusion.  Heart regular rate and rhythm no audible murmurs ?Resp:  Increased respiratory rate and effort but no retractions.  Lungs are clear with good air movement ?Abd:  No distention.  Right lower quadrant tenderness to palpation percussion.  There is no CVA tenderness. ?Extremities: No edema ? ? ?ED Results / Procedures / Treatments  ? ?Labs ?(all labs ordered are listed, but only abnormal results are displayed) ?Labs Reviewed  ?COMPREHENSIVE METABOLIC PANEL - Abnormal; Notable for the following components:  ?    Result Value  ? Glucose, Bld 122 (*)   ? Total Protein 8.9 (*)   ? All other components within normal  limits  ?URINALYSIS, ROUTINE W REFLEX MICROSCOPIC - Abnormal; Notable for the following components:  ? Color, Urine YELLOW (*)   ? APPearance HAZY (*)   ? Ketones, ur 5 (*)   ? Protein, ur >=300 (*)   ? Bacteria, UA RARE (*)   ? All other components within normal limits  ?CBC WITH DIFFERENTIAL/PLATELET - Abnormal; Notable for the following components:  ? Abs Immature Granulocytes 0.10 (*)   ? All other components within normal limits  ?CULTURE, BLOOD (ROUTINE X 2)  ?CULTURE, BLOOD (ROUTINE X 2)  ?URINE CULTURE  ?RESP PANEL BY RT-PCR (FLU A&B, COVID) [id number]  ?LIPASE, BLOOD  ?CBC  ?LACTIC ACID, PLASMA  ?PROTIME-INR  ?APTT  ?PROCALCITONIN  ? ? ? ?EKG ? ? ? ? ?RADIOLOGY ? ?CT read by radiology reviewed in detail by me does not show any appendicitis there is some change consistent with possible mild or early diverticulitis in the sigmoid area. ? ?PROCEDURES: ? ?Critical Care performed: Critical care time 20 minutes.  This includes reevaluating the patient after  the CT and finding she still had right lower quadrant pain although it was somewhat better.  Additionally I spoke with the surgeon who then came down to reevaluate the patient and then I saw the patient 1 more time and told her that we would be watching her because both the surgeon and I were worried about the way she looked sitting there breathing fast with no chest pain and no wheezing and no real shortness of breath chest or right lower quadrant pain.  I then described her to the hospitalist who then admitted her. ? ?Procedures ? ? ?MEDICATIONS ORDERED IN ED: ?Medications  ?0.9 %  sodium chloride infusion ( Intravenous New Bag/Given 08/06/21 1252)  ?morphine (PF) 2 MG/ML injection 1 mg (has no administration in time range)  ?ondansetron (ZOFRAN) injection 4 mg (has no administration in time range)  ?albuterol (PROVENTIL) (2.5 MG/3ML) 0.083% nebulizer solution 3 mL (has no administration in time range)  ?ipratropium-albuterol (DUONEB) 0.5-2.5 (3) MG/3ML nebulizer  solution 3 mL (3 mLs Nebulization Given 08/06/21 1226)  ?dextromethorphan-guaiFENesin (MUCINEX DM) 30-600 MG per 12 hr tablet 1 tablet (has no administration in time range)  ?cefTRIAXone (ROCEPHIN) 2 g in sodium chloride 0.9 % 100 mL IVPB (0 g Intravenous Stopped 08/06/21 1338)  ?metroNIDAZOLE (FLAGYL) IVPB 500 mg (0 mg Intravenous Stopped 08/06/21 1433)  ?heparin injection 5,000 Units (5,000 Units Subcutaneous Patient Refused/Not Given 08/06/21 1417)  ?aspirin EC tablet 81 mg (81 mg Oral Given 08/06/21 1411)  ?traMADol (ULTRAM) tablet 50 mg (has no administration in time range)  ?amLODipine (NORVASC) tablet 10 mg (10 mg Oral Given 08/06/21 1411)  ?rosuvastatin (CRESTOR) tablet 10 mg (10 mg Oral Given 08/06/21 1411)  ?zolpidem (AMBIEN) tablet 5 mg (has no administration in time range)  ?fesoterodine (TOVIAZ) tablet 4 mg (4 mg Oral Given 08/06/21 1412)  ?metaxalone (SKELAXIN) tablet 800 mg (has no administration in time range)  ?cholecalciferol (VITAMIN D3) tablet 2,000 Units (has no administration in time range)  ?multivitamin with minerals tablet 1 tablet (1 tablet Oral Given 08/06/21 1411)  ?ascorbic acid (VITAMIN C) tablet 500 mg (500 mg Oral Given 08/06/21 1411)  ?loratadine (CLARITIN) tablet 10 mg (10 mg Oral Given 08/06/21 1411)  ?montelukast (SINGULAIR) tablet 10 mg (has no administration in time range)  ?magnesium oxide (MAG-OX) tablet 200 mg (200 mg Oral Given 08/06/21 1411)  ?acetaminophen (TYLENOL) tablet 650 mg (has no administration in time range)  ?hydrALAZINE (APRESOLINE) injection 5 mg (has no administration in time range)  ?morphine (PF) 4 MG/ML injection 4 mg (4 mg Intravenous Given 08/06/21 0834)  ?ondansetron [facility]) injection 4 mg (4 mg Intravenous Given 08/06/21 0834)  ?sodium chloride 0.9 % bolus 1,000 mL (0 mLs Intravenous Stopped 08/06/21 1047)  ? ? ? ?IMPRESSION / MDM / ASSESSMENT AND PLAN / ED COURSE  ?I reviewed the triage vital signs and the nursing  notes. ?----------------------------------------- ?10:19 AM on 08/06/2021 ?----------------------------------------- ?I have been back in to see the patient twice now and each time she is laying in bed breathing very hard.  Lungs remain clear.  She still has some right-sided tenderness although not as much.  I am uncertain as to why she looks ill but everything that I have checked is okay except for the mild diverticulitis.  I have called Dr. Richardine Service of the surgeon who will come down and see her in about 20 minutes and see how she looks then and see what he thinks. ? ? ?The patient is on the cardiac monitor to evaluate for  evidence of arrhythmia and/or significant heart rate changes.  None were seen ? ?Dr. Vonna Drafts came down to see the patient feels it would be best to observe the patient for admit the patient and watch them for some time and reevaluate them which she will do because of the right lower quadrant pain.  She just really does not look good. ?  ? ? ?FINAL CLINICAL IMPRESSION(S) / ED DIAGNOSES  ? ?Final diagnoses:  ?Right lower quadrant abdominal pain  ? ? ? ?Rx / DC Orders  ? ?ED Discharge Orders   ? ? None  ? ?  ? ? ? ?Note:  This document was prepared using Dragon voice recognition software and may include unintentional dictation errors. ?  ?Nena Polio, MD ?08/06/21 1541 ? ?

## 2021-08-07 LAB — CBC
HCT: 32.3 % — ABNORMAL LOW (ref 36.0–46.0)
Hemoglobin: 10.3 g/dL — ABNORMAL LOW (ref 12.0–15.0)
MCH: 26 pg (ref 26.0–34.0)
MCHC: 31.9 g/dL (ref 30.0–36.0)
MCV: 81.6 fL (ref 80.0–100.0)
Platelets: 320 10*3/uL (ref 150–400)
RBC: 3.96 MIL/uL (ref 3.87–5.11)
RDW: 14.9 % (ref 11.5–15.5)
WBC: 6.8 10*3/uL (ref 4.0–10.5)
nRBC: 0 % (ref 0.0–0.2)

## 2021-08-07 LAB — BASIC METABOLIC PANEL
Anion gap: 8 (ref 5–15)
BUN: 14 mg/dL (ref 8–23)
CO2: 25 mmol/L (ref 22–32)
Calcium: 8.7 mg/dL — ABNORMAL LOW (ref 8.9–10.3)
Chloride: 107 mmol/L (ref 98–111)
Creatinine, Ser: 0.76 mg/dL (ref 0.44–1.00)
GFR, Estimated: >60 mL/min (ref >60)
Glucose, Bld: 95 mg/dL (ref 70–99)
Potassium: 3 mmol/L — ABNORMAL LOW (ref 3.5–5.1)
Sodium: 140 mmol/L (ref 135–145)

## 2021-08-07 LAB — URINE CULTURE: Culture: NO GROWTH

## 2021-08-07 LAB — GLUCOSE, CAPILLARY: Glucose-Capillary: 84 mg/dL (ref 70–99)

## 2021-08-07 MED ORDER — IPRATROPIUM-ALBUTEROL 0.5-2.5 (3) MG/3ML IN SOLN
3.0000 mL | Freq: Four times a day (QID) | RESPIRATORY_TRACT | Status: DC
  Administered 2021-08-07 (×2): 3 mL via RESPIRATORY_TRACT
  Filled 2021-08-07 (×2): qty 3

## 2021-08-07 MED ORDER — ARFORMOTEROL TARTRATE 15 MCG/2ML IN NEBU
15.0000 ug | INHALATION_SOLUTION | Freq: Two times a day (BID) | RESPIRATORY_TRACT | Status: DC
  Administered 2021-08-07 – 2021-08-08 (×3): 15 ug via RESPIRATORY_TRACT
  Filled 2021-08-07 (×4): qty 2

## 2021-08-07 MED ORDER — METHOCARBAMOL 500 MG PO TABS
500.0000 mg | ORAL_TABLET | Freq: Three times a day (TID) | ORAL | Status: DC
  Administered 2021-08-07 – 2021-08-08 (×4): 500 mg via ORAL
  Filled 2021-08-07 (×4): qty 1

## 2021-08-07 MED ORDER — METHYLPREDNISOLONE SODIUM SUCC 125 MG IJ SOLR
80.0000 mg | Freq: Every day | INTRAMUSCULAR | Status: DC
  Administered 2021-08-07 – 2021-08-08 (×2): 80 mg via INTRAVENOUS
  Filled 2021-08-07 (×2): qty 2

## 2021-08-07 MED ORDER — POTASSIUM CHLORIDE CRYS ER 20 MEQ PO TBCR
40.0000 meq | EXTENDED_RELEASE_TABLET | ORAL | Status: AC
  Administered 2021-08-07 (×2): 40 meq via ORAL
  Filled 2021-08-07 (×2): qty 2

## 2021-08-07 MED ORDER — BUDESONIDE 0.25 MG/2ML IN SUSP
0.2500 mg | Freq: Two times a day (BID) | RESPIRATORY_TRACT | Status: DC
  Administered 2021-08-07 – 2021-08-08 (×3): 0.25 mg via RESPIRATORY_TRACT
  Filled 2021-08-07 (×3): qty 2

## 2021-08-07 NOTE — Progress Notes (Signed)
Patient ambulated to bathroom and back with no O2 sats at 100%, oxygen removed. Patient educated on notifying nurse if she feels short of breath, verbalized understanding. Will continue to monitor sats for possible need for O2.  ?

## 2021-08-07 NOTE — Plan of Care (Signed)

## 2021-08-07 NOTE — Progress Notes (Signed)
Subjective:  ?CC: ?Hayley Howe is a 76 y.o. female  Hospital stay day 1,   right lower quadrant and flank pain ? ?HPI: ?No acute changes overnight.  Patient tolerating clear liquid diet.  Pain is starting to localize more towards the back and she is now reporting this may be an acute exacerbation of her chronic low back pain. ? ?ROS:  ?General: Denies weight loss, weight gain, fatigue, fevers, chills, and night sweats. ?Heart: Denies chest pain, palpitations, racing heart, irregular heartbeat, leg pain or swelling, and decreased activity tolerance. ?Respiratory: Denies breathing difficulty, shortness of breath, wheezing, cough, and sputum. ?GI: Denies change in appetite, heartburn, nausea, vomiting, constipation, diarrhea, and blood in stool. ?GU: Denies difficulty urinating, pain with urinating, urgency, frequency, blood in urine. ? ? ?Objective:  ? ?Temp:  [97.9 ?F (36.6 ?C)-98.2 ?F (36.8 ?C)] 97.9 ?F (36.6 ?C) (04/22 1694) ?Pulse Rate:  [65-86] 82 (04/22 0819) ?Resp:  [18-22] 18 (04/22 0819) ?BP: (121-164)/(57-83) 127/57 (04/22 5038) ?SpO2:  [95 %-100 %] 96 % (04/22 0819)     Height: '5\' 4"'$  (162.6 cm) Weight: 78.9 kg BMI (Calculated): 29.85  ? ?Intake/Output this shift:  ? ?Intake/Output Summary (Last 24 hours) at 08/07/2021 1207 ?Last data filed at 08/07/2021 1023 ?Gross per 24 hour  ?Intake 896.82 ml  ?Output 550 ml  ?Net 346.82 ml  ? ? ?Constitutional :  alert, cooperative, appears stated age, and no distress  ?Respiratory:  Mildly tachypneic taking in deep short breaths  ?Cardiovascular:  regular rate and rhythm, S1, S2 normal, no murmur, click, rub or gallop and regular rate and rhythm  ?Gastrointestinal: Soft no guarding, minimal tenderness in right lower quadrant improved from yesterday, minimal right flank pain as well improved from yesterday .   ?Skin: Cool and moist.  No rashes or sign of new lesions  ?Psychiatric: Normal affect, non-agitated, not confused  ?   ?  ?LABS:  ? ?  Latest Ref Rng & Units  08/07/2021  ?  6:12 AM 08/06/2021  ?  8:15 AM 07/22/2021  ?  9:29 AM  ?CMP  ?Glucose 70 - 99 mg/dL 95   122   89    ?BUN 8 - 23 mg/dL '14   16   25    '$ ?Creatinine 0.44 - 1.00 mg/dL 0.76   0.79   0.89    ?Sodium 135 - 145 mmol/L 140   135   140    ?Potassium 3.5 - 5.1 mmol/L 3.0   3.5   4.1    ?Chloride 98 - 111 mmol/L 107   99   105    ?CO2 22 - 32 mmol/L '25   24   27    '$ ?Calcium 8.9 - 10.3 mg/dL 8.7   9.8   9.9    ?Total Protein 6.5 - 8.1 g/dL  8.9   7.6    ?Total Bilirubin 0.3 - 1.2 mg/dL  0.8   0.4    ?Alkaline Phos 38 - 126 U/L  119     ?AST 15 - 41 U/L  26   24    ?ALT 0 - 44 U/L  20   19    ? ? ?  Latest Ref Rng & Units 08/07/2021  ?  6:12 AM 08/06/2021  ? 12:42 PM 08/06/2021  ?  8:15 AM  ?CBC  ?WBC 4.0 - 10.5 K/uL 6.8   9.1   9.1    ?Hemoglobin 12.0 - 15.0 g/dL 10.3   12.1  12.8    ?Hematocrit 36.0 - 46.0 % 32.3   37.3   39.4    ?Platelets 150 - 400 K/uL 320   347   374    ? ? ?RADS: ?N/a ?Assessment:  ? ?Right lower quadrant pain and flank pain.  Improving, tolerating clears, and no worsening of leukocytosis.  Highly unlikely to be acute appendicitis at this point, no surgical intervention is needed. ? ?Patient still clinically looking like requiring increased work of breathing.  Unsure if her asthma is persistently symptomatic.  Recommend continued hospitalist care as well as management of her possible UTI.  Surgery to sign off.  Please call with additional questions or concerns.  Okay to advance diet as tolerated. ? ?labs/images/medications/previous chart entries reviewed personally and relevant changes/updates noted above. ? ? ?

## 2021-08-07 NOTE — Progress Notes (Signed)
?PROGRESS NOTE ? ? ? ?Hayley Howe  FIE:[id number] DOB: 07-09-1945 DOA: 08/06/2021 ?PCP: Steele Sizer, MD  ? ? ?Brief Narrative:  ?76 y.o. female with medical history significant of asthma, hypertension, hyperlipidemia, chronic back pain, who presents with abdominal pain. ?  ?Patient states that she has abdominal pain for almost 3 days, which is located in right lower quadrant, sharp, severe, radiating to the right flank area and back.  Associated with nausea and 4 times of nonbilious nonbloody vomiting.  Patient denies diarrhea.  No fever or chills.  Patient also reports increased urinary frequency, no dysuria or burning on urination.  She reports shortness of breath and cough with yellow-colored mucus production.  She was found to have oxygen desaturating to 87% on room air, which improved to 100% on 2 L oxygen.  Patient is not using oxygen normally. ? ?General surgery consulted.  Case discussed with surgical consultant.  Clinical presentation is inconsistent with intra-abdominal infection either diverticulitis nor appendicitis.  Urinalysis not entirely consistent with infection.  Procalcitonin negative.  Patient afebrile, no leukocytosis.  All antibiotics discontinued. ? ?Presentation likely consistent with acute decompensated asthma with concomitant acute on chronic lower back pain ? ? ?Assessment & Plan: ?  ?Principal Problem: ?  Abdominal pain ?Active Problems: ?  Acute diverticulitis ?  HTN (hypertension) ?  HLD (hyperlipidemia) ?  Kidney lesion ?  UTI (urinary tract infection) ?  Asthma ? ?Acute exacerbation of asthma ?Patient with a history of severe asthma, followed by Dr. Patsey Berthold ?Is on Trelegy and Dupixent at home ?Case discussed with patient's pulmonologist ?Presentation at this time likely represents mild acute decompensation ?Plan: ?No indication for antibiotics ?IV steroids ?Scheduled and as needed bronchodilators ?Oxygen with vital sign checks ?Nasal cannula if needed ?Anticipate discharge  home 4/23 if respiratory status stable ? ?Urinary tract infection, ruled out ?DC antibiotics ? ?Abdominal pain ?Diverticulitis ruled out ?Unclear etiology ?CT scan with possible sigmoid diverticulitis with possibility of appendicitis ?These are felt unlikely ?Negative procalcitonin, no leukocytosis ?Presentation inconsistent with intra-abdominal infection ?Plan: ?Discontinue antibiotics ?Discontinue IV fluids ? ?Acute on chronic back pain ?3 times daily muscle relaxant ? ?Kidney lesion ?Incidental finding on CT ?Follow-up with PCP for possible outpatient MRI ? ?Hyperlipidemia ?PTA Crestor ? ?Essential hypertension ?PTA amlodipine ?IV hydralazine as needed ?Holding Diovan/hydrochlorothiazide for now, can likely restart 4/23 ? ?DVT prophylaxis: SQ heparin ?Code Status: DNR ?Family Communication: Family member at bedside 4/22 ?Disposition Plan: Status is: Inpatient ?Remains inpatient appropriate because: Acute exacerbation of asthma.  Slowly improving.  Anticipate discharge 4/23. ? ? ?Level of care: Med-Surg ? ?Consultants:  ?None ? ?Procedures:  ?None ? ?Antimicrobials: ?None ? ? ?Subjective: ?Seen and examined.  Mildly increased work of breathing.  Complains of lower back pain. ? ?Objective: ?Vitals:  ? 08/07/21 0328 08/07/21 0749 08/07/21 0819 08/07/21 1319  ?BP: 126/69  (!) 127/57   ?Pulse: 67 66 82 87  ?Resp: '18 20 18 '$ (!) 22  ?Temp: 98.2 ?F (36.8 ?C)  97.9 ?F (36.6 ?C)   ?TempSrc: Oral  Oral   ?SpO2: 98% 100% 96% 95%  ?Weight:      ?Height:      ? ? ?Intake/Output Summary (Last 24 hours) at 08/07/2021 1330 ?Last data filed at 08/07/2021 1201 ?Gross per 24 hour  ?Intake 896.82 ml  ?Output 1000 ml  ?Net -103.18 ml  ? ?Filed Weights  ? 08/06/21 0810  ?Weight: 78.9 kg  ? ? ?Examination: ? ?General exam: Appears calm and comfortable  ?Respiratory system: Scattered  crackles, mild and expiratory wheeze, tachypneic, 2 L ?Cardiovascular system: S1-S2, RRR, no murmurs, no pedal edema ?Gastrointestinal system: Abdomen is  nondistended, soft and nontender. No organomegaly or masses felt. Normal bowel sounds heard. ?Central nervous system: Alert and oriented. No focal neurological deficits. ?Extremities: Symmetric 5 x 5 power. ?Skin: No rashes, lesions or ulcers ?Psychiatry: Judgement and insight appear normal. Mood & affect appropriate.  ? ? ? ?Data Reviewed: I have personally reviewed following labs and imaging studies ? ?CBC: ?Recent Labs  ?Lab 08/06/21 ?1610 08/06/21 ?1242 08/07/21 ?0612  ?WBC 9.1 9.1 6.8  ?NEUTROABS  --  5.9  --   ?HGB 12.8 12.1 10.3*  ?HCT 39.4 37.3 32.3*  ?MCV 80.6 81.3 81.6  ?PLT 374 347 320  ? ?Basic Metabolic Panel: ?Recent Labs  ?Lab 08/06/21 ?9604 08/07/21 ?5409  ?NA 135 140  ?K 3.5 3.0*  ?CL 99 107  ?CO2 24 25  ?GLUCOSE 122* 95  ?BUN 16 14  ?CREATININE 0.79 0.76  ?CALCIUM 9.8 8.7*  ? ?GFR: ?Estimated Creatinine Clearance: 62.7 mL/min (by C-G formula based on SCr of 0.76 mg/dL). ?Liver Function Tests: ?Recent Labs  ?Lab 08/06/21 ?0815  ?AST 26  ?ALT 20  ?ALKPHOS 119  ?BILITOT 0.8  ?PROT 8.9*  ?ALBUMIN 4.0  ? ?Recent Labs  ?Lab 08/06/21 ?0815  ?LIPASE 38  ? ?No results for input(s): AMMONIA in the last 168 hours. ?Coagulation Profile: ?Recent Labs  ?Lab 08/06/21 ?1242  ?INR 1.0  ? ?Cardiac Enzymes: ?No results for input(s): CKTOTAL, CKMB, CKMBINDEX, TROPONINI in the last 168 hours. ?BNP (last 3 results) ?No results for input(s): PROBNP in the last 8760 hours. ?HbA1C: ?No results for input(s): HGBA1C in the last 72 hours. ?CBG: ?Recent Labs  ?Lab 08/07/21 ?0816  ?GLUCAP 84  ? ?Lipid Profile: ?No results for input(s): CHOL, HDL, LDLCALC, TRIG, CHOLHDL, LDLDIRECT in the last 72 hours. ?Thyroid Function Tests: ?No results for input(s): TSH, T4TOTAL, FREET4, T3FREE, THYROIDAB in the last 72 hours. ?Anemia Panel: ?No results for input(s): VITAMINB12, FOLATE, FERRITIN, TIBC, IRON, RETICCTPCT in the last 72 hours. ?Sepsis Labs: ?Recent Labs  ?Lab 08/06/21 ?1242  ?PROCALCITON <0.10  ?LATICACIDVEN 0.9  ? ? ?Recent  Results (from the past 240 hour(s))  ?Urine Culture     Status: None  ? Collection Time: 08/06/21  8:22 AM  ? Specimen: Urine, Clean Catch  ?Result Value Ref Range Status  ? Specimen Description   Final  ?  URINE, CLEAN CATCH ?Performed at [facility], [place], [address], [place], [place] [address] ?  ? Special Requests   Final  ?  NONE ?Performed at [facility], [address], [place], [place] [address] ?  ? Culture   Final  ?  NO GROWTH ?Performed at [facility], [place] [address], [place], [place] [address] ?  ? Report Status 08/07/2021 FINAL  Final  ?Culture, blood (x 2)     Status: None (Preliminary result)  ? Collection Time: 08/06/21 12:48 PM  ? Specimen: BLOOD RIGHT ARM  ?Result Value Ref Range Status  ? Specimen Description BLOOD RIGHT ARM  Final  ? Special Requests   Final  ?  BOTTLES DRAWN AEROBIC AND ANAEROBIC Blood Culture results may not be optimal due to an inadequate volume of blood received in culture bottles  ? Culture   Final  ?  NO GROWTH < 24 HOURS ?Performed at [facility], [address] [place], [place] [address] ?  ? Report Status PENDING  Incomplete  ?Resp Panel by RT-PCR (Flu A&B, Covid) Nasopharyngeal Swab     Status: None  ? Collection Time: 08/06/21  3:03 PM  ? Specimen: Nasopharyngeal Swab; Nasopharyngeal(NP) swabs in vial transport medium  ?Result Value Ref Range Status  ? SARS Coronavirus 2 by RT PCR NEGATIVE NEGATIVE Final  ?  Comment: (NOTE) ?SARS-CoV-2 target nucleic acids are NOT DETECTED. ? ?The SARS-CoV-2 RNA is generally detectable in upper respiratory ?specimens during the acute phase of infection. The lowest ?concentration of SARS-CoV-2 viral copies this assay can detect is ?138 copies/mL. A negative result does not preclude SARS-Cov-2 ?infection and should not be used as the sole basis for treatment or ?other patient management decisions. A negative result may occur with  ?improper specimen collection/handling, submission of specimen  other ?than nasopharyngeal swab, presence of viral mutation(s) within the ?areas targeted by this assay, and inadequate number of viral ?copies(<138 copies/mL). A negative result must be combined with ?clinical obser

## 2021-08-07 NOTE — Progress Notes (Signed)
Patient had no complaints of pain through the night. No N/V/D. ?

## 2021-08-08 DIAGNOSIS — J4521 Mild intermittent asthma with (acute) exacerbation: Secondary | ICD-10-CM

## 2021-08-08 LAB — GLUCOSE, CAPILLARY: Glucose-Capillary: 92 mg/dL (ref 70–99)

## 2021-08-08 MED ORDER — IPRATROPIUM-ALBUTEROL 0.5-2.5 (3) MG/3ML IN SOLN: 3.0000 mL | RESPIRATORY_TRACT | Status: DC | PRN

## 2021-08-08 MED ORDER — METHOCARBAMOL 500 MG PO TABS: 500.0000 mg | ORAL_TABLET | Freq: Three times a day (TID) | ORAL | 0 refills | Status: DC | PRN

## 2021-08-08 MED ORDER — PREDNISONE 20 MG PO TABS: 40.0000 mg | ORAL_TABLET | Freq: Every day | ORAL | 0 refills | Status: AC

## 2021-08-08 MED ORDER — IPRATROPIUM-ALBUTEROL 0.5-2.5 (3) MG/3ML IN SOLN
3.0000 mL | Freq: Three times a day (TID) | RESPIRATORY_TRACT | Status: DC
  Administered 2021-08-08: 3 mL via RESPIRATORY_TRACT
  Filled 2021-08-08: qty 3

## 2021-08-08 NOTE — Progress Notes (Signed)
Pt is for DC. AVS was given and explained to pt. Work note was given to pt too. Pending family to come and pick up pt.  ?

## 2021-08-08 NOTE — Plan of Care (Signed)

## 2021-08-08 NOTE — Discharge Summary (Signed)
Physician Discharge Summary  ?Hayley Howe TFT:[id number] DOB: November 16, 1945 DOA: 08/06/2021 ? ?PCP: Steele Sizer, MD ? ?Admit date: 08/06/2021 ?Discharge date: 08/08/2021 ? ?Admitted From: Home ?Disposition: Home ? ?Recommendations for Outpatient Follow-up:  ?Follow up with PCP in 1-2 weeks ?Follow-up with pulmonology as directed ? ?Home Health: No ?Equipment/Devices: None ? ?Discharge Condition: Stable ?CODE STATUS: DNR ?Diet recommendation: Regular ? ?Brief/Interim Summary: ? ?76 y.o. female with medical history significant of asthma, hypertension, hyperlipidemia, chronic back pain, who presents with abdominal pain. ?  ?Patient states that she has abdominal pain for almost 3 days, which is located in right lower quadrant, sharp, severe, radiating to the right flank area and back.  Associated with nausea and 4 times of nonbilious nonbloody vomiting.  Patient denies diarrhea.  No fever or chills.  Patient also reports increased urinary frequency, no dysuria or burning on urination.  She reports shortness of breath and cough with yellow-colored mucus production.  She was found to have oxygen desaturating to 87% on room air, which improved to 100% on 2 L oxygen.  Patient is not using oxygen normally. ?  ?General surgery consulted.  Case discussed with surgical consultant.  Clinical presentation is inconsistent with intra-abdominal infection either diverticulitis nor appendicitis.  Urinalysis not entirely consistent with infection.  Procalcitonin negative.  Patient afebrile, no leukocytosis.  All antibiotics discontinued. ?  ?Presentation likely consistent with acute decompensated asthma with concomitant acute on chronic lower back pain.  Started on short course of steroids and bronchodilators.  Respiratory status improved.  Back pain also improved with addition of muscle relaxants.  Stable for discharge home.  At time of discharge will recommend short course of prednisone 40 mg a day x3 additional days.  Also  recommend discontinuation of tramadol due to poor effectiveness and poorly understood interactions with other medications.  Will recommend muscle relaxants 3 times daily as needed.  Follow-up outpatient PCP and pulmonology. ? ? ?Discharge Diagnoses:  ?Principal Problem: ?  Abdominal pain ?Active Problems: ?  Acute diverticulitis ?  HTN (hypertension) ?  HLD (hyperlipidemia) ?  Kidney lesion ?  UTI (urinary tract infection) ?  Asthma ? ?Acute exacerbation of asthma ?Patient with a history of severe asthma, followed by Dr. Patsey Berthold ?Is on Trelegy and Dupixent at home ?Case discussed with patient's pulmonologist ?Presentation at this time likely represents mild acute decompensation ?Plan: ?Wean to room air at time of discharge per respiratory status improved.  Patient can discharge home and resume home bronchodilator regimen.  Resume home [place].  Recommend short course of prednisone 40 mg a day x3 additional days.  Follow-up outpatient PCP and pulmonology. ?  ?Urinary tract infection, ruled out ?DC antibiotics ?  ?Abdominal pain ?Diverticulitis ruled out ?Unclear etiology ?CT scan with possible sigmoid diverticulitis with possibility of appendicitis ?These are felt unlikely ?Negative procalcitonin, no leukocytosis ?Presentation inconsistent with intra-abdominal infection ?Plan: ?Tramadol proved to be ineffective and has some poorly understood interactions with other chronic medications.  Will discontinue at this time.  Will initiate Robaxin 500 milligrams p.o. 3 times daily as needed.  Follow-up outpatient PCP. ? ?Discharge Instructions ? ?Discharge Instructions   ? ? Diet - low sodium heart healthy   Complete by: As directed ?  ? Increase activity slowly   Complete by: As directed ?  ? ?  ? ?Allergies as of 08/08/2021   ?No Known Allergies ?  ? ?  ?Medication List  ?  ? ?STOP taking these medications   ? ?metaxalone 800 MG tablet ?  Commonly known as: SKELAXIN ?  ?traMADol 50 MG tablet ?Commonly known as: ULTRAM ?   ? ?  ? ?TAKE these medications   ? ?albuterol 108 (90 Base) MCG/ACT inhaler ?Commonly known as: VENTOLIN HFA ?Inhale 2 puffs into the lungs every 6 (six) hours as needed for wheezing or shortness of breath. ?  ?amLODipine 10 MG tablet ?Commonly known as: NORVASC ?Take 1 tablet (10 mg total) by mouth daily. ?  ?aspirin EC 81 MG tablet ?Take 1 tablet (81 mg total) by mouth daily. ?  ?Azelastine-Fluticasone 137-50 MCG/ACT Susp ?Place 1 spray into both nostrils 2 (two) times daily. ?  ?Dupixent 300 MG/2ML Sopn ?Generic drug: Dupilumab ?Inject 300 mg into the skin every 14 (fourteen) days. ?  ?loratadine 10 MG tablet ?Commonly known as: CLARITIN ?Take 1 tablet (10 mg total) by mouth daily. ?  ?Magnesium 250 MG Tabs ?Take 1 tablet by mouth daily. ?  ?methocarbamol 500 MG tablet ?Commonly known as: ROBAXIN ?Take 1 tablet (500 mg total) by mouth every 8 (eight) hours as needed for muscle spasms. ?  ?montelukast 10 MG tablet ?Commonly known as: SINGULAIR ?Take 1 tablet (10 mg total) by mouth at bedtime. ?  ?MULTIVITAMIN ADULT PO ?Take by mouth. ?  ?predniSONE 20 MG tablet ?Commonly known as: DELTASONE ?Take 2 tablets (40 mg total) by mouth daily for 3 days. ?Start taking on: August 09, 2021 ?  ?rosuvastatin 10 MG tablet ?Commonly known as: Crestor ?Take 1 tablet (10 mg total) by mouth daily. ?  ?tolterodine 2 MG 24 hr capsule ?Commonly known as: Detrol LA ?Take 1 capsule (2 mg total) by mouth daily. ?  ?Trelegy Ellipta 200-62.5-25 MCG/ACT Aepb ?Generic drug: Fluticasone-Umeclidin-Vilant ?Inhale 1 puff into the lungs daily. ?  ?valsartan-hydrochlorothiazide 160-12.5 MG tablet ?Commonly known as: DIOVAN-HCT ?Take 1 tablet by mouth daily. ?  ?vitamin C 500 MG tablet ?Commonly known as: ASCORBIC ACID ?Take 1 tablet by mouth daily. ?  ?Vitamin D 50 MCG (2000 UT) tablet ?Take 1 tablet by mouth daily. ?  ?zolpidem 12.5 MG CR tablet ?Commonly known as: AMBIEN CR ?Take 1 tablet (12.5 mg total) by mouth at bedtime. ?  ? ?  ? ? ?No  Known Allergies ? ?Consultations: ?None ? ? ?Procedures/Studies: ?DG Chest Port 1 View ? ?Result Date: 08/06/2021 ?CLINICAL DATA:  Increased shortness of breath, abdominal pain EXAM: PORTABLE CHEST 1 VIEW COMPARISON:  09/18/2019 FINDINGS: Cardiac and mediastinal contours are within normal limits when accounting for AP technique. No focal pulmonary opacity. No pleural effusion or pneumothorax. No acute osseous abnormality. IMPRESSION: No acute cardiopulmonary process. Electronically Signed   By: Merilyn Baba M.D.   On: 08/06/2021 14:16  ? ?CT Renal Stone Study ? ?Result Date: 08/06/2021 ?CLINICAL DATA:  Right flank pain EXAM: CT ABDOMEN AND PELVIS WITHOUT CONTRAST TECHNIQUE: Multidetector CT imaging of the abdomen and pelvis was performed following the standard protocol without IV contrast. RADIATION DOSE REDUCTION: This exam was performed according to the departmental dose-optimization program which includes automated exposure control, adjustment of the mA and/or kV according to patient size and/or use of iterative reconstruction technique. COMPARISON:  None. FINDINGS: Lower chest: Left basilar atelectasis.  No acute abnormality. Hepatobiliary: No focal liver abnormality is seen. No gallstones, gallbladder wall thickening, or biliary dilatation. Pancreas: Unremarkable. No pancreatic ductal dilatation or surrounding inflammatory changes. Spleen: Normal in size without focal abnormality. Adrenals/Urinary Tract: Bilateral adrenal glands are unremarkable. No hydronephrosis or nephrolithiasis. Simple appearing cyst of the mid/upper pole of the left kidney,  no further follow-up recommended for this lesion. Peripherally calcified lesion of the lower pole of the right kidney measuring 2.0 x 1.5 cm. Bladder is unremarkable. Stomach/Bowel: Small hiatal hernia. Stomach is otherwise within normal limits. Appendix appears normal. Diverticulosis. Mild fat stranding is seen adjacent to a sigmoid diverticula, best seen on series 5,  image 52. No evidence of bowel wall thickening or obstruction. Vascular/Lymphatic: Aortic atherosclerosis. No enlarged abdominal or pelvic lymph nodes. Reproductive: No adnexal masses. Other: No abdominal wall hern

## 2021-08-09 ENCOUNTER — Telehealth: Payer: Self-pay

## 2021-08-09 NOTE — Telephone Encounter (Signed)
Transition Care Management Follow-up Telephone Call ?Date of discharge and from where: 08/08/21 [facility] ?How have you been since you were released from the hospital? Pt states she is doing much better ?Any questions or concerns? No ? ?Items Reviewed: ?Did the pt receive and understand the discharge instructions provided? Yes  ?Medications obtained and verified? Yes  ?Other? Yes  ?Any new allergies since your discharge? No  ?Dietary orders reviewed? Yes ?Do you have support at home? Yes  ? ?Home Care and Equipment/Supplies: ?Were home health services ordered? no ?  ? ?Were any new equipment or medical supplies ordered?  No ? ? ?Functional Questionnaire: (I = Independent and D = Dependent) ?ADLs: I ? ?Bathing/Dressing- I ? ?Meal Prep- i ? ?Eating- I ? ?Maintaining continence- I ? ?Transferring/Ambulation- I ? ?Managing Meds- I ? ?Follow up appointments reviewed: ? ?PCP Hospital f/u appt confirmed? No; please advise for scheduling due to PCP schedule full.  ?[facility] f/u appt confirmed? Yes  Scheduled to see Dr. Patsey Berthold on 10/04/21. ?Are transportation arrangements needed? No  ?If their condition worsens, is the pt aware to call PCP or go to the Emergency Dept.? Yes ?Was the patient provided with contact information for the PCP's office or ED? Yes ?Was to pt encouraged to call back with questions or concerns? Yes  ?

## 2021-08-10 ENCOUNTER — Other Ambulatory Visit: Payer: Self-pay

## 2021-08-10 DIAGNOSIS — Z86010 Personal history of colonic polyps: Secondary | ICD-10-CM

## 2021-08-10 MED ORDER — NA SULFATE-K SULFATE-MG SULF 17.5-3.13-1.6 GM/177ML PO SOLN: 1.0000 | Freq: Once | ORAL | 0 refills | Status: AC

## 2021-08-10 MED ORDER — ONDANSETRON HCL 4 MG PO TABS: 4.0000 mg | ORAL_TABLET | Freq: Three times a day (TID) | ORAL | 0 refills | Status: DC | PRN

## 2021-08-10 NOTE — Progress Notes (Signed)
Gastroenterology Pre-Procedure Review ? ?Request Date: 09/02/2021 ?Requesting Physician: Dr. Vicente Males ? ? ?PATIENT REVIEW QUESTIONS: The patient responded to the following health history questions as indicated:   ? ?1. Are you having any GI issues? no ?2. Do you have a personal history of Polyps? yes (last colonoscopy) ?3. Do you have a family history of Colon Cancer or Polyps? yes (colon cancer) ?4. Diabetes Mellitus? no ?5. Joint replacements in the past 12 months?no ?6. Major health problems in the past 3 months?no ?7. Any artificial heart valves, MVP, or defibrillator?no ?   ?MEDICATIONS & ALLERGIES:    ?Patient reports the following regarding taking any anticoagulation/antiplatelet therapy:   ?Plavix, Coumadin, Eliquis, Xarelto, Lovenox, Pradaxa, Brilinta, or Effient? no ?Aspirin? yes (81 mg) ? ?Patient confirms/reports the following medications:  ?Current Outpatient Medications  ?Medication Sig Dispense Refill  ? albuterol (VENTOLIN HFA) 108 (90 Base) MCG/ACT inhaler Inhale 2 puffs into the lungs every 6 (six) hours as needed for wheezing or shortness of breath. 8 g 6  ? amLODipine (NORVASC) 10 MG tablet Take 1 tablet (10 mg total) by mouth daily. 90 tablet 1  ? aspirin EC 81 MG tablet Take 1 tablet (81 mg total) by mouth daily. 30 tablet 0  ? Azelastine-Fluticasone 137-50 MCG/ACT SUSP Place 1 spray into both nostrils 2 (two) times daily.    ? Cholecalciferol (VITAMIN D) 2000 UNITS tablet Take 1 tablet by mouth daily.    ? Dupilumab (DUPIXENT) 300 MG/2ML SOPN Inject 300 mg into the skin every 14 (fourteen) days. 12 mL 1  ? Fluticasone-Umeclidin-Vilant (TRELEGY ELLIPTA) 200-62.5-25 MCG/ACT AEPB Inhale 1 puff into the lungs daily. 28 each 11  ? loratadine (CLARITIN) 10 MG tablet Take 1 tablet (10 mg total) by mouth daily. 30 tablet 0  ? Magnesium 250 MG TABS Take 1 tablet by mouth daily.    ? methocarbamol (ROBAXIN) 500 MG tablet Take 1 tablet (500 mg total) by mouth every 8 (eight) hours as needed for muscle  spasms. 90 tablet 0  ? montelukast (SINGULAIR) 10 MG tablet Take 1 tablet (10 mg total) by mouth at bedtime. 90 tablet 1  ? Multiple Vitamin (MULTIVITAMIN ADULT PO) Take by mouth.    ? predniSONE (DELTASONE) 20 MG tablet Take 2 tablets (40 mg total) by mouth daily for 3 days. 6 tablet 0  ? rosuvastatin (CRESTOR) 10 MG tablet Take 1 tablet (10 mg total) by mouth daily. 90 tablet 1  ? tolterodine (DETROL LA) 2 MG 24 hr capsule Take 1 capsule (2 mg total) by mouth daily. 90 capsule 1  ? valsartan-hydrochlorothiazide (DIOVAN-HCT) 160-12.5 MG tablet Take 1 tablet by mouth daily. 90 tablet 1  ? vitamin C (ASCORBIC ACID) 500 MG tablet Take 1 tablet by mouth daily.    ? zolpidem (AMBIEN CR) 12.5 MG CR tablet Take 1 tablet (12.5 mg total) by mouth at bedtime. 90 tablet 0  ? ?No current facility-administered medications for this visit.  ? ? ?Patient confirms/reports the following allergies:  ?No Known Allergies ? ?No orders of the defined types were placed in this encounter. ? ? ?AUTHORIZATION INFORMATION ?Primary Insurance: ?1D#: ?Group #: ? ?Secondary Insurance: ?1D#: ?Group #: ? ?SCHEDULE INFORMATION: ?Date: 09/02/2021 ?Time: ?Location:[facility] ? ?

## 2021-08-11 LAB — CULTURE, BLOOD (ROUTINE X 2): Culture: NO GROWTH

## 2021-08-13 ENCOUNTER — Telehealth: Payer: Self-pay

## 2021-08-13 NOTE — Telephone Encounter (Signed)
Copied from [place] [phone]. Topic: General - Other ?>> Aug 13, 2021  2:23 PM Pawlus, Brayton Layman A wrote: ?Reason for CRM: Pt wanted to know if Dr Ancil Boozer would be able to complete a form she needs for work, pt was not sure if this needs to be completed by Dr Ancil Boozer or if she would have to get it from the ED since she was seen there on 4/21, please advise. ?

## 2021-08-13 NOTE — Telephone Encounter (Signed)
Let voicemail inquiring what type of form she is needing ?

## 2021-08-19 NOTE — Progress Notes (Signed)
Name: Hayley Howe   MRN: [id number]    DOB: 20-Apr-1945   Date:08/20/2021 ? ?     Progress Note ? ?Subjective ? ?Chief Complaint ? ?Hospital discharge follow up ? ?HPI ? ?Admission date: 08/06/2021 ?Discharge date was :08/08/2021  ? ?Reason for admission: abdominal pain ? ?She developed acute onset of abdominal pain a few days prior to going to the [facility]. It was on RLQ , severe, radiating to right  flank . She was also found to be hypoxic. She was treated for asthma exacerbation, given steroids, nebulizing therapy, also given antibiotics for possible UTI but that was stopped once infection rule out and muscle relaxer for acute on chronic back pain. She went back to work and is back on [place]. She is feeling better now. She has mild cough, no SOB or wheezing. She states back pain is back to baseline - she is off Tramadol and taking methocarbamol and states it has helped  ? ? ?Medication reconciliation done today ? ? ? ?Patient Active Problem List  ? Diagnosis Date Noted  ? Hospital discharge follow-up 08/20/2021  ? HTN (hypertension) 08/06/2021  ? HLD (hyperlipidemia) 08/06/2021  ? Kidney lesion 08/06/2021  ? Chronic rhinitis 07/02/2021  ? Senile purpura ([place]) 05/14/2020  ? Moderate persistent asthma without complication 54/65/6812  ? Pre-diabetes 05/14/2020  ? Hyperglycemia 07/13/2017  ? Chronic constipation 12/15/2016  ? Primary osteoarthritis of left hip 11/05/2015  ? Scoliosis 11/05/2015  ? Chronic right-sided low back pain with right-sided sciatica 10/30/2014  ? Benign essential HTN 10/27/2014  ? Bradycardia 10/27/2014  ? Insomnia 10/27/2014  ? Headache, temporal 10/27/2014  ? Hypercholesteremia 10/27/2014  ? Obesity (BMI 30-39.9) 10/27/2014  ? Changing sleep-work schedule, affecting sleep 10/27/2014  ? Vitamin D deficiency 10/27/2014  ? Arthritis of knee, degenerative 09/24/2009  ? ? ?Past Surgical History:  ?Procedure Laterality Date  ? ABDOMINAL HYSTERECTOMY    ? BREAST BIOPSY Right 01/21/2013  ? stereo -  benign  ? COLONOSCOPY WITH PROPOFOL N/A 06/30/2016  ? Procedure: COLONOSCOPY WITH PROPOFOL;  Surgeon: Jonathon Bellows, MD;  Location: [facility] ENDOSCOPY;  Service: Endoscopy;  Laterality: N/A;  ? KNEE SURGERY Left 2012  ? Dr. Rudene Christians  ? OOPHORECTOMY    ? TUBAL LIGATION    ? VAGINAL PROLAPSE REPAIR    ? ? ?Family History  ?Problem Relation Age of Onset  ? Breast cancer Sister 64  ?     in year 2013  ? Hyperlipidemia Mother   ? Diabetes Mother   ? Cancer Father   ?     Leukemia  ? Hyperlipidemia Father   ? Lung cancer Brother   ? Breast cancer Sister   ? Heart failure Brother   ? ? ?Social History  ? ?Tobacco Use  ? Smoking status: Former  ?  Packs/day: 0.25  ?  Years: 1.00  ?  Pack years: 0.25  ?  Types: Cigarettes  ?  Start date: 08/13/1967  ?  Quit date: 04/18/1977  ?  Years since quitting: 44.3  ? Smokeless tobacco: Never  ? Tobacco comments:  ?  smoking cessation materials not required  ?Substance Use Topics  ? Alcohol use: No  ?  Alcohol/week: 0.0 standard drinks  ? ? ? ?Current Outpatient Medications:  ?  albuterol (VENTOLIN HFA) 108 (90 Base) MCG/ACT inhaler, Inhale 2 puffs into the lungs every 6 (six) hours as needed for wheezing or shortness of breath., Disp: 8 g, Rfl: 6 ?  amLODipine (NORVASC) 10 MG tablet, Take  1 tablet (10 mg total) by mouth daily., Disp: 90 tablet, Rfl: 1 ?  aspirin EC 81 MG tablet, Take 1 tablet (81 mg total) by mouth daily., Disp: 30 tablet, Rfl: 0 ?  Azelastine-Fluticasone 137-50 MCG/ACT SUSP, Place 1 spray into both nostrils 2 (two) times daily., Disp: , Rfl:  ?  Cholecalciferol (VITAMIN D) 2000 UNITS tablet, Take 1 tablet by mouth daily., Disp: , Rfl:  ?  Dupilumab (DUPIXENT) 300 MG/2ML SOPN, Inject 300 mg into the skin every 14 (fourteen) days., Disp: 12 mL, Rfl: 1 ?  Fluticasone-Umeclidin-Vilant (TRELEGY ELLIPTA) 200-62.5-25 MCG/ACT AEPB, Inhale 1 puff into the lungs daily., Disp: 28 each, Rfl: 11 ?  loratadine (CLARITIN) 10 MG tablet, Take 1 tablet (10 mg total) by mouth daily., Disp: 30 tablet,  Rfl: 0 ?  Magnesium 250 MG TABS, Take 1 tablet by mouth daily., Disp: , Rfl:  ?  methocarbamol (ROBAXIN) 500 MG tablet, Take 1 tablet (500 mg total) by mouth every 8 (eight) hours as needed for muscle spasms., Disp: 90 tablet, Rfl: 0 ?  montelukast (SINGULAIR) 10 MG tablet, Take 1 tablet (10 mg total) by mouth at bedtime., Disp: 90 tablet, Rfl: 1 ?  Multiple Vitamin (MULTIVITAMIN ADULT PO), Take by mouth., Disp: , Rfl:  ?  Na Sulfate-K Sulfate-Mg Sulf 17.5-3.13-1.6 GM/177ML SOLN, Take by mouth., Disp: , Rfl:  ?  ondansetron (ZOFRAN) 4 MG tablet, Take 1 tablet (4 mg total) by mouth every 8 (eight) hours as needed for nausea or vomiting., Disp: 20 tablet, Rfl: 0 ?  rosuvastatin (CRESTOR) 10 MG tablet, Take 1 tablet (10 mg total) by mouth daily., Disp: 90 tablet, Rfl: 1 ?  tolterodine (DETROL LA) 2 MG 24 hr capsule, Take 1 capsule (2 mg total) by mouth daily., Disp: 90 capsule, Rfl: 1 ?  valsartan-hydrochlorothiazide (DIOVAN-HCT) 160-12.5 MG tablet, Take 1 tablet by mouth daily., Disp: 90 tablet, Rfl: 1 ?  vitamin C (ASCORBIC ACID) 500 MG tablet, Take 1 tablet by mouth daily., Disp: , Rfl:  ?  zolpidem (AMBIEN CR) 12.5 MG CR tablet, Take 1 tablet (12.5 mg total) by mouth at bedtime., Disp: 90 tablet, Rfl: 0 ? ?No Known Allergies ? ?I personally reviewed active problem list, medication list, allergies, family history, social history with the patient/caregiver today. ? ? ?ROS ? ?Constitutional: Negative for fever or weight change.  ?Respiratory: Negative for cough and shortness of breath.   ?Cardiovascular: Negative for chest pain or palpitations.  ?Gastrointestinal: Negative for abdominal pain, no bowel changes.  ?Musculoskeletal: Negative for gait problem or joint swelling.  ?Skin: Negative for rash.  ?Neurological: Negative for dizziness or headache.  ?No other specific complaints in a complete review of systems (except as listed in HPI above).  ?Objective ? ?Vitals:  ? 08/20/21 0804  ?BP: 128/74  ?Pulse: 82  ?Resp:  16  ?SpO2: 98%  ?Weight: 174 lb (78.9 kg)  ?Height: '5\' 4"'  (1.626 m)  ? ? ?Body mass index is 29.87 kg/m?. ? ?Physical Exam ? ?Constitutional: Patient appears well-developed and well-nourished.  No distress.  ?HEENT: head atraumatic, normocephalic, pupils equal and reactive to light,, neck supple, ?Cardiovascular: Normal rate, regular rhythm and normal heart sounds.  No murmur heard. No BLE edema. ?Pulmonary/Chest: Effort normal and breath sounds normal. No respiratory distress. ?Abdominal: Soft.  There is no tenderness. ?Muscular skeletal : no pain during palpation of lumbar spine, negative straight leg raise ?Psychiatric: Patient has a normal mood and affect. behavior is normal. Judgment and thought content normal.  ? ?  Recent Results (from the past 2160 hour(s))  ?Lipid panel     Status: None  ? Collection Time: 07/22/21  9:29 AM  ?Result Value Ref Range  ? Cholesterol 194 <200 mg/dL  ? HDL 90 > OR = 50 mg/dL  ? Triglycerides 38 <150 mg/dL  ? LDL Cholesterol (Calc) 93 mg/dL (calc)  ?  Comment: Reference range: <100 ?Marland Kitchen ?Desirable range <100 mg/dL for primary prevention;   ?<70 mg/dL for patients with CHD or diabetic patients  ?with > or = 2 CHD risk factors. ?. ?LDL-C is now calculated using the Martin-Hopkins  ?calculation, which is a validated novel method providing  ?better accuracy than the Friedewald equation in the  ?estimation of LDL-C.  ?Cresenciano Genre et al. Annamaria Helling. 4709;295(74): 2061-2068  ?(http://education.[employer].com/faq/FAQ164) ?  ? Total CHOL/HDL Ratio 2.2 <5.0 (calc)  ? Non-HDL Cholesterol (Calc) 104 <130 mg/dL (calc)  ?  Comment: For patients with diabetes plus 1 major ASCVD risk  ?factor, treating to a non-HDL-C goal of <100 mg/dL  ?(LDL-C of <70 mg/dL) is considered a therapeutic  ?option. ?  ?COMPLETE METABOLIC PANEL WITH GFR     Status: None  ? Collection Time: 07/22/21  9:29 AM  ?Result Value Ref Range  ? Glucose, Bld 89 65 - 99 mg/dL  ?  Comment: . ?           Fasting reference interval ?. ?   ? BUN 25 7 - 25 mg/dL  ? Creat 0.89 0.60 - 1.00 mg/dL  ? eGFR 68 > OR = 60 mL/min/1.68m  ?  Comment: The eGFR is based on the CKD-EPI 2021 equation. To calculate  ?the new eGFR from a previous Creatinin

## 2021-08-20 ENCOUNTER — Encounter: Payer: Self-pay | Admitting: Family Medicine

## 2021-08-20 ENCOUNTER — Ambulatory Visit (INDEPENDENT_AMBULATORY_CARE_PROVIDER_SITE_OTHER): Payer: BC Managed Care – PPO | Admitting: Family Medicine

## 2021-08-20 VITALS — BP 128/74 | HR 82 | Resp 16 | Ht 64.0 in | Wt 174.0 lb

## 2021-08-20 DIAGNOSIS — Z09 Encounter for follow-up examination after completed treatment for conditions other than malignant neoplasm: Secondary | ICD-10-CM

## 2021-08-20 DIAGNOSIS — J4541 Moderate persistent asthma with (acute) exacerbation: Secondary | ICD-10-CM | POA: Diagnosis not present

## 2021-08-20 DIAGNOSIS — G8929 Other chronic pain: Secondary | ICD-10-CM | POA: Diagnosis not present

## 2021-08-20 DIAGNOSIS — Z1211 Encounter for screening for malignant neoplasm of colon: Secondary | ICD-10-CM

## 2021-08-20 DIAGNOSIS — Z23 Encounter for immunization: Secondary | ICD-10-CM

## 2021-08-20 DIAGNOSIS — M5441 Lumbago with sciatica, right side: Secondary | ICD-10-CM | POA: Diagnosis not present

## 2021-08-20 MED ORDER — METHOCARBAMOL 500 MG PO TABS: 500.0000 mg | ORAL_TABLET | Freq: Three times a day (TID) | ORAL | 0 refills | Status: AC | PRN

## 2021-08-20 NOTE — Assessment & Plan Note (Signed)
Doing better, we will fill out FMLA ?Stay off Tramadol ?Responding to Robaxin for pain and advised to add Tylenol ?Asthma flare resolved, back on Dupixent and Trelegy ?Right lower quadrant improved, she is constipated but has a colonoscopy scheduled for May 18 th with Dr. Vicente Males ?

## 2021-08-26 ENCOUNTER — Telehealth: Payer: Self-pay | Admitting: Gastroenterology

## 2021-08-26 NOTE — Telephone Encounter (Signed)
Called patient back and I answered all of her questions for her upcoming procedure on 09/02/2021. Patient had no further questions. ?

## 2021-08-26 NOTE — Telephone Encounter (Signed)
Pt left message has a question about procedure on 08/23/2021 ?

## 2021-09-01 ENCOUNTER — Encounter: Payer: Self-pay | Admitting: Gastroenterology

## 2021-09-02 ENCOUNTER — Encounter
Admission: RE | Disposition: A | Discharge status: Home / Self Care | Payer: Self-pay | Source: Home / Self Care | Attending: Gastroenterology

## 2021-09-02 ENCOUNTER — Encounter: Payer: Self-pay | Admitting: Gastroenterology

## 2021-09-02 ENCOUNTER — Ambulatory Visit: Payer: BC Managed Care – PPO | Admitting: Certified Registered Nurse Anesthetist

## 2021-09-02 ENCOUNTER — Ambulatory Visit
Admission: RE | Admit: 2021-09-02 | Discharge: 2021-09-02 | Disposition: A | Discharge status: Home / Self Care | Payer: BC Managed Care – PPO | Attending: Gastroenterology | Admitting: Gastroenterology

## 2021-09-02 DIAGNOSIS — E669 Obesity, unspecified: Secondary | ICD-10-CM | POA: Diagnosis not present

## 2021-09-02 DIAGNOSIS — Z6829 Body mass index (BMI) 29.0-29.9, adult: Secondary | ICD-10-CM | POA: Diagnosis not present

## 2021-09-02 DIAGNOSIS — Z86010 Personal history of colonic polyps: Secondary | ICD-10-CM | POA: Insufficient documentation

## 2021-09-02 DIAGNOSIS — K5730 Diverticulosis of large intestine without perforation or abscess without bleeding: Secondary | ICD-10-CM | POA: Diagnosis not present

## 2021-09-02 DIAGNOSIS — I10 Essential (primary) hypertension: Secondary | ICD-10-CM | POA: Diagnosis not present

## 2021-09-02 DIAGNOSIS — E785 Hyperlipidemia, unspecified: Secondary | ICD-10-CM | POA: Insufficient documentation

## 2021-09-02 DIAGNOSIS — D126 Benign neoplasm of colon, unspecified: Secondary | ICD-10-CM | POA: Diagnosis not present

## 2021-09-02 DIAGNOSIS — Z1211 Encounter for screening for malignant neoplasm of colon: Secondary | ICD-10-CM | POA: Diagnosis not present

## 2021-09-02 DIAGNOSIS — D122 Benign neoplasm of ascending colon: Secondary | ICD-10-CM | POA: Diagnosis not present

## 2021-09-02 DIAGNOSIS — K635 Polyp of colon: Secondary | ICD-10-CM

## 2021-09-02 DIAGNOSIS — J45909 Unspecified asthma, uncomplicated: Secondary | ICD-10-CM | POA: Insufficient documentation

## 2021-09-02 HISTORY — PX: COLONOSCOPY WITH PROPOFOL: SHX5780

## 2021-09-02 HISTORY — DX: Unspecified asthma, uncomplicated: J45.909

## 2021-09-02 SURGERY — COLONOSCOPY WITH PROPOFOL
Anesthesia: General

## 2021-09-02 MED ORDER — PROPOFOL 500 MG/50ML IV EMUL
INTRAVENOUS | Status: DC | PRN
  Administered 2021-09-02: 160 ug/kg/min via INTRAVENOUS

## 2021-09-02 MED ORDER — SODIUM CHLORIDE 0.9 % IV SOLN: INTRAVENOUS | Status: DC

## 2021-09-02 MED ORDER — PROPOFOL 500 MG/50ML IV EMUL
INTRAVENOUS | Status: AC
  Filled 2021-09-02: qty 50

## 2021-09-02 MED ORDER — PROPOFOL 10 MG/ML IV BOLUS
INTRAVENOUS | Status: DC | PRN
  Administered 2021-09-02 (×2): 20 mg via INTRAVENOUS
  Administered 2021-09-02: 60 mg via INTRAVENOUS

## 2021-09-02 NOTE — H&P (Signed)
Jonathon Bellows, MD [address], [place], [place], [place], [address] [address], [place], [place], [place], [address] Phone: [phone]  Fax: [phone]  Primary Care Physician:  Steele Sizer, MD   Pre-Procedure History & Physical: HPI:  Hayley Howe is a 76 y.o. female is here for an colonoscopy.   Past Medical History:  Diagnosis Date   Asthma    Hyperlipidemia    Hypertension    Insomnia    Low back pain    Obesity    Osteoarthritis of left knee     Past Surgical History:  Procedure Laterality Date   ABDOMINAL HYSTERECTOMY     BREAST BIOPSY Right 01/21/2013   stereo - benign   COLONOSCOPY WITH PROPOFOL N/A 06/30/2016   Procedure: COLONOSCOPY WITH PROPOFOL;  Surgeon: Jonathon Bellows, MD;  Location: [facility] ENDOSCOPY;  Service: Endoscopy;  Laterality: N/A;   KNEE SURGERY Left 2012   Dr. Rudene Christians   OOPHORECTOMY     TUBAL LIGATION     VAGINAL PROLAPSE REPAIR      Prior to Admission medications   Medication Sig Start Date End Date Taking? Authorizing Provider  loratadine (CLARITIN) 10 MG tablet Take 1 tablet (10 mg total) by mouth daily. 06/08/18  Yes Poulose, Bethel Born, NP  valsartan-hydrochlorothiazide (DIOVAN-HCT) 160-12.5 MG tablet Take 1 tablet by mouth daily. 07/22/21  Yes Sowles, Drue Stager, MD  albuterol (VENTOLIN HFA) 108 (90 Base) MCG/ACT inhaler Inhale 2 puffs into the lungs every 6 (six) hours as needed for wheezing or shortness of breath. 07/30/20   Tyler Pita, MD  amLODipine (NORVASC) 10 MG tablet Take 1 tablet (10 mg total) by mouth daily. 07/22/21   Steele Sizer, MD  aspirin EC 81 MG tablet Take 1 tablet (81 mg total) by mouth daily. 01/19/18   Steele Sizer, MD  Azelastine-Fluticasone 137-50 MCG/ACT SUSP Place 1 spray into both nostrils 2 (two) times daily. 04/17/20   [provider]  Cholecalciferol (VITAMIN D) 2000 UNITS tablet Take 1 tablet by mouth daily. 02/11/11   [provider]  Dupilumab (DUPIXENT) 300 MG/2ML SOPN Inject  300 mg into the skin every 14 (fourteen) days. 07/09/21   Tyler Pita, MD  Fluticasone-Umeclidin-Vilant (TRELEGY ELLIPTA) 200-62.5-25 MCG/ACT AEPB Inhale 1 puff into the lungs daily. 06/14/21   Tyler Pita, MD  Magnesium 250 MG TABS Take 1 tablet by mouth daily.    [provider]  methocarbamol (ROBAXIN) 500 MG tablet Take 1 tablet (500 mg total) by mouth every 8 (eight) hours as needed for muscle spasms. 08/20/21 11/18/21  Steele Sizer, MD  montelukast (SINGULAIR) 10 MG tablet Take 1 tablet (10 mg total) by mouth at bedtime. 07/22/21   Steele Sizer, MD  Multiple Vitamin (MULTIVITAMIN ADULT PO) Take by mouth.    [provider]  Na Sulfate-K Sulfate-Mg Sulf 17.5-3.13-1.6 GM/177ML SOLN Take by mouth. 08/10/21   [provider]  ondansetron (ZOFRAN) 4 MG tablet Take 1 tablet (4 mg total) by mouth every 8 (eight) hours as needed for nausea or vomiting. 08/10/21   Jonathon Bellows, MD  rosuvastatin (CRESTOR) 10 MG tablet Take 1 tablet (10 mg total) by mouth daily. 07/22/21   Steele Sizer, MD  tolterodine (DETROL LA) 2 MG 24 hr capsule Take 1 capsule (2 mg total) by mouth daily. 01/09/20   Steele Sizer, MD  vitamin C (ASCORBIC ACID) 500 MG tablet Take 1 tablet by mouth daily.    [provider]  zolpidem (AMBIEN CR) 12.5 MG CR tablet  Take 1 tablet (12.5 mg total) by mouth at bedtime. 07/22/21   Steele Sizer, MD    Allergies as of 08/10/2021   (No Known Allergies)    Family History  Problem Relation Age of Onset   Breast cancer Sister 56       in year 2013   Hyperlipidemia Mother    Diabetes Mother    Cancer Father        Leukemia   Hyperlipidemia Father    Lung cancer Brother    Breast cancer Sister    Heart failure Brother     Social History   Socioeconomic History   Marital status: Widowed    Spouse name: Not on file   Number of children: 2   Years of education: Not on file   Highest education level: 12th grade  Occupational History    Occupation: Education officer, museum: [email or web address]    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  Tobacco Use   Smoking status: Former    Packs/day: 0.25    Years: 1.00    Pack years: 0.25    Types: Cigarettes    Start date: 08/13/1967    Quit date: 04/18/1977    Years since quitting: 44.4   Smokeless tobacco: Never   Tobacco comments:    smoking cessation materials not required  Vaping Use   Vaping Use: Never used  Substance and Sexual Activity   Alcohol use: No    Alcohol/week: 0.0 standard drinks   Drug use: No   Sexual activity: Not Currently  Other Topics Concern   Not on file  Social History Narrative   Daughter lives with her and grandson   Still working full time at [employer] 3rd shift      Mother of [place] Determinants of Radio broadcast assistant Strain: Low Risk    Difficulty of Paying Living Expenses: Not hard at all  Food Insecurity: No Food Insecurity   Worried About Charity fundraiser in the Last Year: Never true   Arboriculturist in the Last Year: Never true  Transportation Needs: No Transportation Needs   Lack of Transportation (Medical): No   Lack of Transportation (Non-Medical): No  Physical Activity: Inactive   Days of Exercise per Week: 0 days   Minutes of Exercise per Session: 0 min  Stress: No Stress Concern Present   Feeling of Stress : Not at all  Social Connections: Moderately Integrated   Frequency of Communication with Friends and Family: More than three times a week   Frequency of Social Gatherings with Friends and Family: Twice a week   Attends Religious Services: More than 4 times per year   Active Member of [employer] or Organizations: Yes   Attends Archivist Meetings: More than 4 times per year   Marital Status: Widowed  Human resources officer Violence: Not At Risk   Fear of Current or Ex-Partner: No   Emotionally Abused: No   Physically Abused: No   Sexually Abused: No    Review of Systems: See HPI, otherwise negative ROS  Physical  Exam: BP (!) 153/86   Pulse 82   Temp (!) 97.1 F (36.2 C) (Temporal)   Resp 18   Ht '5\' 4"'$  (1.626 m)   Wt 78.9 kg   SpO2 98%   BMI 29.87 kg/m  General:   Alert,  pleasant and cooperative in NAD Head:  Normocephalic and atraumatic. Neck:  Supple; no masses or thyromegaly. Lungs:  Clear throughout to auscultation, normal respiratory effort.    Heart:  +S1, +S2, Regular rate and rhythm, No edema. Abdomen:  Soft, nontender and nondistended. Normal bowel sounds, without guarding, and without rebound.   Neurologic:  Alert and  oriented x4;  grossly normal neurologically.  Impression/Plan: Hayley Howe is here for an colonoscopy to be performed for surveillance due to prior history of colon polyps   Risks, benefits, limitations, and alternatives regarding  colonoscopy have been reviewed with the patient.  Questions have been answered.  All parties agreeable.   Jonathon Bellows, MD  09/02/2021, 10:18 AM

## 2021-09-02 NOTE — Op Note (Signed)
[facility] Patient Name: Hayley Howe Procedure Date: 09/02/2021 10:12 AM MRN: [id number] Account #: [id number] Date of Birth: 1945-05-26 Admit Type: Outpatient Age: 76 Room: [facility] ENDO ROOM 3 Gender: Female Note Status: Finalized Instrument Name: Jasper Riling [id number] Procedure:             Colonoscopy Indications:           Surveillance: Personal history of adenomatous polyps                         on last colonoscopy 5 years ago Providers:             Jonathon Bellows MD, MD Referring MD:          Bethena Roys. Sowles, MD (Referring MD) Medicines:             Monitored Anesthesia Care Complications:         No immediate complications. Procedure:             Pre-Anesthesia Assessment:                        - Prior to the procedure, a History and Physical was                         performed, and patient medications, allergies and                         sensitivities were reviewed. The patient's tolerance                         of previous anesthesia was reviewed.                        - The risks and benefits of the procedure and the                         sedation options and risks were discussed with the                         patient. All questions were answered and informed                         consent was obtained.                        - ASA Grade Assessment: II - A patient with mild                         systemic disease.                        After obtaining informed consent, the colonoscope was                         passed under direct vision. Throughout the procedure,                         the patient's blood pressure, pulse, and oxygen  saturations were monitored continuously. The                         Colonoscope was introduced through the anus and                         advanced to the the cecum, identified by the                         appendiceal orifice. The colonoscopy was performed                          with ease. The patient tolerated the procedure well.                         The quality of the bowel preparation was adequate. Findings:      The perianal and digital rectal examinations were normal.      Two sessile polyps were found in the ascending colon. The polyps were 4       to 6 mm in size. These polyps were removed with a cold snare. Resection       and retrieval were complete.      Multiple small and large-mouthed diverticula were found in the left       colon.      The exam was otherwise without abnormality on direct and retroflexion       views. Impression:            - Two 4 to 6 mm polyps in the ascending colon, removed                         with a cold snare. Resected and retrieved.                        - Diverticulosis in the left colon.                        - The examination was otherwise normal on direct and                         retroflexion views. Recommendation:        - Discharge patient to home (with escort).                        - Resume previous diet.                        - Continue present medications.                        - Await pathology results.                        - Repeat colonoscopy is not recommended due to current                         age (76 years or older) for surveillance. Procedure Code(s):     --- Professional ---  45385, Colonoscopy, flexible; with removal of                         tumor(s), polyp(s), or other lesion(s) by snare                         technique Diagnosis Code(s):     --- Professional ---                        Z86.010, Personal history of colonic polyps                        K63.5, Polyp of colon                        K57.30, Diverticulosis of large intestine without                         perforation or abscess without bleeding CPT copyright 2019 American Medical Association. All rights reserved. The codes documented in this report are preliminary and upon coder  review may  be revised to meet current compliance requirements. Jonathon Bellows, MD Jonathon Bellows MD, MD 09/02/2021 10:54:10 AM This report has been signed electronically. Number of Addenda: 0 Note Initiated On: 09/02/2021 10:12 AM Scope Withdrawal Time: 0 hours 15 minutes 22 seconds  Total Procedure Duration: 0 hours 21 minutes 26 seconds  Estimated Blood Loss:  Estimated blood loss: none.      [facility]

## 2021-09-02 NOTE — Transfer of Care (Signed)
Immediate Anesthesia Transfer of Care Note  Patient: Hayley Howe  Procedure(s) Performed: COLONOSCOPY WITH PROPOFOL  Patient Location: PACU  Anesthesia Type:General  Level of Consciousness: drowsy  Airway & Oxygen Therapy: Patient Spontanous Breathing  Post-op Assessment: Report given to RN and Post -op Vital signs reviewed and stable  Post vital signs: Reviewed and stable  Last Vitals:  Vitals Value Taken Time  BP    Temp    Pulse    Resp    SpO2      Last Pain:  Vitals:   09/02/21 1000  TempSrc: Temporal  PainSc: 5          Complications: No notable events documented.

## 2021-09-02 NOTE — Anesthesia Procedure Notes (Signed)
Date/Time: 09/02/2021 10:24 AM Performed by: Demetrius Charity, CRNA Pre-anesthesia Checklist: Patient identified, Emergency Drugs available, Suction available, Patient being monitored and Timeout performed Patient Re-evaluated:Patient Re-evaluated prior to induction Oxygen Delivery Method: Nasal cannula Induction Type: IV induction Placement Confirmation: CO2 detector and positive ETCO2

## 2021-09-02 NOTE — Anesthesia Preprocedure Evaluation (Signed)
Anesthesia Evaluation  Patient identified by MRN, date of birth, ID band Patient awake    Reviewed: Allergy & Precautions, H&P , NPO status , Patient's Chart, lab work & pertinent test results, reviewed documented beta blocker date and time   History of Anesthesia Complications Negative for: history of anesthetic complications  Airway Mallampati: I  TM Distance: >3 FB Neck ROM: full    Dental  (+) Missing, Dental Advidsory Given, Poor Dentition   Pulmonary neg shortness of breath, asthma , neg COPD, neg recent URI, former smoker,           Cardiovascular Exercise Tolerance: Good hypertension, On Medications (-) angina(-) CAD, (-) Past MI, (-) Cardiac Stents and (-) CABG (-) dysrhythmias (-) Valvular Problems/Murmurs     Neuro/Psych negative neurological ROS  negative psych ROS   GI/Hepatic negative GI ROS, Neg liver ROS,   Endo/Other  negative endocrine ROS  Renal/GU negative Renal ROS  negative genitourinary   Musculoskeletal   Abdominal   Peds  Hematology negative hematology ROS (+)   Anesthesia Other Findings Past Medical History: No date: Hyperlipidemia No date: Hypertension No date: Insomnia No date: Low back pain No date: Obesity No date: Osteoarthritis of left knee   Reproductive/Obstetrics negative OB ROS                             Anesthesia Physical  Anesthesia Plan  ASA: 2  Anesthesia Plan: General   Post-op Pain Management:    Induction: Intravenous  PONV Risk Score and Plan: 3 and Propofol infusion and TIVA  Airway Management Planned: Natural Airway and Nasal Cannula  Additional Equipment:   Intra-op Plan:   Post-operative Plan:   Informed Consent: I have reviewed the patients History and Physical, chart, labs and discussed the procedure including the risks, benefits and alternatives for the proposed anesthesia with the patient or authorized  representative who has indicated his/her understanding and acceptance.     Dental Advisory Given  Plan Discussed with: Anesthesiologist, CRNA and Surgeon  Anesthesia Plan Comments:         Anesthesia Quick Evaluation

## 2021-09-03 ENCOUNTER — Encounter: Payer: Self-pay | Admitting: Gastroenterology

## 2021-09-03 LAB — SURGICAL PATHOLOGY

## 2021-09-03 NOTE — Anesthesia Postprocedure Evaluation (Signed)
Anesthesia Post Note  Patient: Hayley Howe  Procedure(s) Performed: COLONOSCOPY WITH PROPOFOL  Patient location during evaluation: Endoscopy Anesthesia Type: General Level of consciousness: awake and alert Pain management: pain level controlled Vital Signs Assessment: post-procedure vital signs reviewed and stable Respiratory status: spontaneous breathing, nonlabored ventilation, respiratory function stable and patient connected to nasal cannula oxygen Cardiovascular status: blood pressure returned to baseline and stable Postop Assessment: no apparent nausea or vomiting Anesthetic complications: no   No notable events documented.   Last Vitals:  Vitals:   09/02/21 1103 09/02/21 1113  BP: 134/72 (!) 144/74  Pulse:  61  Resp: (!) 22 17  Temp:    SpO2: 100% 100%    Last Pain:  Vitals:   09/03/21 0741  TempSrc:   PainSc: 0-No pain                 Martha Clan

## 2021-09-29 ENCOUNTER — Ambulatory Visit: Payer: Self-pay

## 2021-09-29 NOTE — Telephone Encounter (Signed)
Pt went to chiropractor and he diagnosed her with Trochanteric bursitis.  Pt in a lot of pain and limping.  She says if she can't be seen today she may have to go to UC. Please advise if pt can be seen today or tomorrow at [facility]    Chief Complaint: Right hip pain, getting worse, limping Symptoms: Above Frequency: Several months Pertinent Negatives: Patient denies  Disposition: '[]'$ ED /'[]'$ Urgent Care (no appt availability in office) / '[x]'$ Appointment(In office/virtual)/ '[]'$  [facility]/ '[]'$ Home Care/ '[]'$ Refused Recommended Disposition /'[]'$ [facility]/ '[]'$  Follow-up with PCP Additional Notes:    Reason for Disposition  [1] MODERATE pain (e.g., interferes with normal activities, limping) AND [2] present > 3 days  Answer Assessment - Initial Assessment Questions 1. LOCATION and RADIATION: "Where is the pain located?"      Right hip 2. QUALITY: "What does the pain feel like?"  (e.g., sharp, dull, aching, burning)     Achy 3. SEVERITY: "How bad is the pain?" "What does it keep you from doing?"   (Scale 1-10; or mild, moderate, severe)   -  MILD (1-3): doesn't interfere with normal activities    -  MODERATE (4-7): interferes with normal activities (e.g., work or school) or awakens from sleep, limping    -  SEVERE (8-10): excruciating pain, unable to do any normal activities, unable to walk     10/10 4. ONSET: "When did the pain start?" "Does it come and go, or is it there all the time?"     Several months 5. WORK OR EXERCISE: "Has there been any recent work or exercise that involved this part of the body?"      No 6. CAUSE: "What do you think is causing the hip pain?"      Unsure 7. AGGRAVATING FACTORS: "What makes the hip pain worse?" (e.g., walking, climbing stairs, running)     Walking 8. OTHER SYMPTOMS: "Do you have any other symptoms?" (e.g., back pain, pain shooting down leg,  fever, rash)     No  Protocols used: Hip Pain-A-AH

## 2021-09-30 ENCOUNTER — Encounter: Payer: Self-pay | Admitting: Internal Medicine

## 2021-09-30 ENCOUNTER — Other Ambulatory Visit: Payer: Self-pay

## 2021-09-30 ENCOUNTER — Ambulatory Visit
Admission: RE | Admit: 2021-09-30 | Discharge: 2021-09-30 | Disposition: A | Discharge status: Home / Self Care | Payer: BC Managed Care – PPO | Source: Ambulatory Visit | Attending: Internal Medicine | Admitting: Internal Medicine

## 2021-09-30 ENCOUNTER — Ambulatory Visit (INDEPENDENT_AMBULATORY_CARE_PROVIDER_SITE_OTHER): Payer: BC Managed Care – PPO | Admitting: Internal Medicine

## 2021-09-30 ENCOUNTER — Ambulatory Visit
Admission: RE | Admit: 2021-09-30 | Discharge: 2021-09-30 | Disposition: A | Discharge status: Home / Self Care | Payer: BC Managed Care – PPO | Attending: Family Medicine | Admitting: Family Medicine

## 2021-09-30 VITALS — BP 132/78 | HR 83 | Temp 97.7°F | Resp 16 | Ht 64.0 in | Wt 180.9 lb

## 2021-09-30 DIAGNOSIS — M25551 Pain in right hip: Secondary | ICD-10-CM | POA: Insufficient documentation

## 2021-09-30 DIAGNOSIS — M1611 Unilateral primary osteoarthritis, right hip: Secondary | ICD-10-CM | POA: Diagnosis not present

## 2021-09-30 MED ORDER — METHYLPREDNISOLONE 4 MG PO TBPK: ORAL_TABLET | ORAL | 0 refills | Status: DC

## 2021-09-30 NOTE — Patient Instructions (Addendum)
It was great seeing you today!  Plan discussed at today's visit: -Right hip x-ray today, I will let you know what it shows and plan from there  -Steroid pack sent to pharmacy but also recommend staying off the hip for 3-4 days  -Discuss with chiropractor about resetting pelvis   -See more instructions below   Follow up in: if pain worsens or fails to improve   Take care and let us know if you have any questions or concerns prior to your next visit.  Dr. Rosana Berger  Hip Bursitis  Hip bursitis is swelling of one or more fluid-filled sacs (bursae) in your hip joint. If the bursa becomes irritated, it can fill with extra fluid and become swollen. This condition can cause pain, and your symptoms may come and go over time. What are the causes? Repeated use of your hip muscles. Injury to the hip. Weak butt muscles. Bone spurs. Infection. In some cases, the cause may not be known. What increases the risk? Having a past hip injury or hip surgery. Having a condition, such as arthritis, gout, diabetes, or thyroid disease. Having spine problems. Having one leg that is shorter than the other. Running a lot or doing long-distance running. Playing sports where there is a risk of injury or falling, such as football, martial arts, or skiing. What are the signs or symptoms? Symptoms may come and go, and they often include: Pain in the hip or groin area. Pain may get worse when you move your hip. Tenderness and swelling of the hip. In rare cases, the bursa may become infected. If this happens, you may: Get a fever. Have warmth and redness in the hip area. How is this treated? This condition is treated by: Resting your hip. Icing your hip. Wrapping the hip area with an elastic bandage (compression wrap). Keeping the hip raised. Other treatments may include: Using crutches, a cane, or a walker. Medicines. Draining fluid out of the bursa. Surgery to take out a bursa. This is rare. Long-term  treatment may include: Doing exercises to help your strength and flexibility. Lifestyle changes like losing weight to lessen the strain on your hip. Follow these instructions at home: Managing pain, stiffness, and swelling     If told, put ice on the painful area. To do this: Put ice in a plastic bag. Place a towel between your skin and the bag. Leave the ice on for 20 minutes, 2-3 times a day. Take off the ice if your skin turns bright red. This is very important. If you cannot feel pain, heat, or cold, you have a greater risk of damage to the area. Raise your hip by putting a pillow under your hips while you lie down. Stop if you feel pain. If told, put heat on the affected area. Do this as often as told by your doctor. Use the heat source that your doctor recommends, such as a moist heat pack or a heating pad. Place a towel between your skin and the heat source. Leave the heat on for 20-30 minutes. Take off the heat if your skin turns bright red. This is very important. If you cannot feel pain, heat, or cold, you have a greater risk of getting burned. Activity Do not use your hip to support your body weight until your doctor says that you can. Use crutches, a cane, or a walker as told by your doctor. If the affected leg is one that you use to drive, ask your doctor if it  is safe to drive. Rest and protect your hip as much as you can until you feel better. Return to your normal activities when your doctor says that it is safe. Do exercises as told by your doctor. General instructions Take over-the-counter and prescription medicines only as told by your doctor. Gently rub and stretch your injured area as often as is comfortable. Wear elastic bandages only as told by your doctor. If one of your legs is shorter than the other, get fitted for a shoe insert or orthotic. Keep a healthy weight. Follow instructions from your doctor. Keep all follow-up visits. How is this  prevented? Exercise regularly or as told by your doctor. Wear the right shoes for the sport you play and for daily activities. Warm up and stretch before being active. Cool down and stretch after being active. Take breaks often from repeated activity. Avoid activities that bother your hip or cause pain. Avoid sitting down for a long time. Where to find more information American Academy of Orthopaedic Surgeons: orthoinfo.aaos.org Contact a doctor if: You have a fever. You have new symptoms. You have trouble walking or doing everyday activities. You have pain that gets worse or does not get better with medicine. The skin around your hip is red. You get a feeling of warmth in your hip area. Get help right away if: You cannot move your hip. You have very bad pain. You cannot control the muscles in your feet. Summary Hip bursitis is swelling of one or more fluid-filled sacs (bursae) in your hip joint. Symptoms often come and go over time. This condition is often treated by resting and icing the hip. It also may help to keep the area raised and wrapped in an elastic bandage. Other treatments may be needed. This information is not intended to replace advice given to you by your health care provider. Make sure you discuss any questions you have with your health care provider. Document Revised: 03/30/2021 Document Reviewed: 03/30/2021 Elsevier Patient Education  [place].

## 2021-09-30 NOTE — Progress Notes (Signed)
Acute Office Visit  Subjective:     Patient ID: Hayley Howe, female    DOB: 30-Apr-1945, 76 y.o.   MRN: [id number]  Chief Complaint  Patient presents with   Hip Pain    Right hip    HPI Patient is in today for right hip pain.  HIP PAIN Duration:  chronic but worse in the last 2-3 weeks Involved hip: right  Mechanism of injury: unknown, no trauma  Location: diffuse Onset: gradual  Severity: severe  Quality: ill-defined Frequency: constant Radiation: no Aggravating factors: weight bearing, walking, and movement   Alleviating factors: NSAIDs and rest  Status: worse Treatments attempted: rest, ibuprofen, and chiropractor   Relief with NSAIDs?: mild Weakness with weight bearing: no Weakness with walking: no Paresthesias / decreased sensation: yes Swelling: yes Redness:no Fevers: yes Does have some accompanying back pain, has scoliosis and sees a chiropractor once a month. Works at [employer] third shift for 8 hours 4 times a week.   Review of Systems  Constitutional:  Negative for chills and fever.  Musculoskeletal:  Positive for back pain and joint pain.  Neurological:  Negative for tingling, sensory change and weakness.        Objective:    BP 132/78   Pulse 83   Temp 97.7 F (36.5 C) (Oral)   Resp 16   Ht '5\' 4"'$  (1.626 m)   Wt 180 lb 14.4 oz (82.1 kg)   SpO2 98%   BMI 31.05 kg/m  BP Readings from Last 3 Encounters:  09/30/21 132/78  09/02/21 (!) 144/74  08/20/21 128/74   Wt Readings from Last 3 Encounters:  09/30/21 180 lb 14.4 oz (82.1 kg)  09/02/21 174 lb (78.9 kg)  08/20/21 174 lb (78.9 kg)      Physical Exam Constitutional:      Appearance: Normal appearance.  HENT:     Head: Normocephalic and atraumatic.  Eyes:     Conjunctiva/sclera: Conjunctivae normal.  Cardiovascular:     Rate and Rhythm: Normal rate and regular rhythm.  Pulmonary:     Breath sounds: Normal breath sounds.  Musculoskeletal:        General:  Swelling and tenderness present.     Lumbar back: No bony tenderness. Decreased range of motion. Scoliosis present.     Right hip: Tenderness and bony tenderness present. Decreased range of motion.     Comments: Pain over right greater trochanter radiating into groin, slight limp. Tenderness over SI joint, significant scoliosis present.    Skin:    General: Skin is warm and dry.  Neurological:     General: No focal deficit present.     Mental Status: She is alert. Mental status is at baseline.     Gait: Gait abnormal.  Psychiatric:        Mood and Affect: Mood normal.        Behavior: Behavior normal.     No results found for any visits on 09/30/21.      Assessment & Plan:   1. Right hip pain: Lateral hip pain, does have significant scoliosis and SI pain as well as acute on chronic greater trochanter pain. Consider worsening OA vs. Bursitis. Will obtain x-ray today to assess joint space. Will treat with oral steroids in the meantime. Discussed rest and ice therapy. She was given a work note for tomorrow and Saturday when she is scheduled to work. Consider steroid injection after x-ray.   - methylPREDNISolone (MEDROL DOSEPAK) 4 MG TBPK tablet;  Day 1: Take 8 mg (2 tablets) before breakfast, 4 mg (1 tablet) after lunch, 4 mg (1 tablet) after supper, and 8 mg (2 tablets) at bedtime. Day 2:Take 4 mg (1 tablet) before breakfast, 4 mg (1 tablet) after lunch, 4 mg (1 tablet) after supper, and 8 mg (2 tablets) at bedtime. Day 3: Take 4 mg (1 tablet) before breakfast, 4 mg (1 tablet) after lunch, 4 mg (1 tablet) after supper, and 4 mg (1 tablet) at bedtime. Day 4: Take 4 mg (1 tablet) before breakfast, 4 mg (1 tablet) after lunch, and 4 mg (1 tablet) at bedtime. Day 5: Take 4 mg (1 tablet) before breakfast and 4 mg (1 tablet) at bedtime. Day 6: Take 4 mg (1 tablet) before breakfast.  Dispense: 1 each; Refill: 0 - DG Hip Unilat W OR W/O Pelvis 2-3 Views Right; Future  Return if symptoms worsen or  fail to improve.  Teodora Medici, DO

## 2021-10-01 NOTE — Addendum Note (Signed)
Addended by: Teodora Medici on: 10/01/2021 08:18 AM   Modules accepted: Orders

## 2021-10-04 ENCOUNTER — Ambulatory Visit (INDEPENDENT_AMBULATORY_CARE_PROVIDER_SITE_OTHER): Payer: BC Managed Care – PPO | Admitting: Pulmonary Disease

## 2021-10-04 ENCOUNTER — Encounter: Payer: Self-pay | Admitting: Pulmonary Disease

## 2021-10-04 VITALS — BP 126/78 | HR 68 | Temp 97.8°F | Ht 66.0 in | Wt 186.0 lb

## 2021-10-04 DIAGNOSIS — J310 Chronic rhinitis: Secondary | ICD-10-CM

## 2021-10-04 DIAGNOSIS — J4550 Severe persistent asthma, uncomplicated: Secondary | ICD-10-CM | POA: Diagnosis not present

## 2021-10-04 DIAGNOSIS — R053 Chronic cough: Secondary | ICD-10-CM

## 2021-10-04 DIAGNOSIS — J329 Chronic sinusitis, unspecified: Secondary | ICD-10-CM

## 2021-10-04 NOTE — Patient Instructions (Signed)
We are going to stop the montelukast (Singulair).  Continue your Trelegy and the Dupixent shots.   We will see you in follow-up in 4 months time call sooner should any new problems arise.

## 2021-10-04 NOTE — Progress Notes (Signed)
Subjective:    Patient ID: Hayley Howe, female    DOB: 09-17-1945, 76 y.o.   MRN: [id number] Patient Care Team: Steele Sizer, MD as PCP - General (Family Medicine) Grant Fontana, [place] as Consulting Physician (Chiropractic Medicine) Beverly Gust, MD as Consulting Physician (Otolaryngology) Jonathon Bellows, MD as Consulting Physician (Gastroenterology) Tyler Pita, MD as Consulting Physician (Pulmonary Disease)  Chief Complaint  Patient presents with   Follow-up    Occ prod cough with clear sputum.     HPI Hayley Howe is a 76 year old very remote former smoker with minimal tobacco exposure, who presents for the issue of severe persistent asthma with allergic phenotype and chronic rhinosinusitis.  This is a scheduled visit.  She was last evaluated on 02 July 2021 by Rexene Edison, NP.  She started Dupixent on March 2023, she has been tolerating it well.  Since starting she has noted steady improvement in her asthma symptoms and control.  She has not had any exacerbations, her cough has resolved.  She also notes clearing of her sinuses significantly.  She she is very compliant with her nasal hygiene and with her Trelegy and as needed albuterol.  She has not required as needed albuterol in months.  She has not had any fevers, chills or sweats.  No sputum production, no hemoptysis.  No orthopnea or paroxysmal nocturnal dyspnea.  No lower extremity edema, no calf tenderness.  Overall she feels well and looks well.   TEST/EVENTS : PFTs on February 28, 2019 showed normal lung function with an FEV1 at 87%, ratio 72, FVC 94%, no significant bronchodilator response CT chest November 2020 mild bronchial wall thickening with bronchial secretions and associated scarring and linear atelectasis in the right middle lobe, left upper lobe and lingula.  Otherwise clear with no evidence of interstitial lung disease, findings of healed granulomatous disease in the right lower lobe CT sinus April 08, 2019 extensive acute on chronic sinusitis, air-fluid levels in the maxillary sinuses, minimal improvement and left frontal sinus and scattered ethmoid air cells January 2023 absolute eosinophil count 500, IgE 545, allergen panel negative except for Cladosporium Herbarum IgE (common mold)    Review of Systems A 10 point review of systems was performed and it is as noted above otherwise negative.  Patient Active Problem List   Diagnosis Date Noted   Hospital discharge follow-up 08/20/2021   HTN (hypertension) 08/06/2021   HLD (hyperlipidemia) 08/06/2021   Kidney lesion 08/06/2021   Chronic rhinitis 07/02/2021   Senile purpura ([place]) 05/14/2020   Moderate persistent asthma without complication 58/12/9831   Pre-diabetes 05/14/2020   Hyperglycemia 07/13/2017   Chronic constipation 12/15/2016   Primary osteoarthritis of left hip 11/05/2015   Scoliosis 11/05/2015   Chronic right-sided low back pain with right-sided sciatica 10/30/2014   Benign essential HTN 10/27/2014   Bradycardia 10/27/2014   Insomnia 10/27/2014   Headache, temporal 10/27/2014   Hypercholesteremia 10/27/2014   Obesity (BMI 30-39.9) 10/27/2014   Changing sleep-work schedule, affecting sleep 10/27/2014   Vitamin D deficiency 10/27/2014   Arthritis of knee, degenerative 09/24/2009   Social History   Tobacco Use   Smoking status: Former    Packs/day: 0.25    Years: 1.00    Total pack years: 0.25    Types: Cigarettes    Start date: 08/13/1967    Quit date: 04/18/1977    Years since quitting: 44.4   Smokeless tobacco: Never   Tobacco comments:    smoking cessation materials not required  Substance Use  Topics   Alcohol use: No    Alcohol/week: 0.0 standard drinks of alcohol   No Known Allergies Current Meds  Medication Sig   albuterol (VENTOLIN HFA) 108 (90 Base) MCG/ACT inhaler Inhale 2 puffs into the lungs every 6 (six) hours as needed for wheezing or shortness of breath.   amLODipine (NORVASC) 10 MG tablet  Take 1 tablet (10 mg total) by mouth daily.   aspirin EC 81 MG tablet Take 1 tablet (81 mg total) by mouth daily.   Azelastine-Fluticasone 137-50 MCG/ACT SUSP Place 1 spray into both nostrils 2 (two) times daily.   Cholecalciferol (VITAMIN D) 2000 UNITS tablet Take 1 tablet by mouth daily.   Dupilumab (DUPIXENT) 300 MG/2ML SOPN Inject 300 mg into the skin every 14 (fourteen) days.   Fluticasone-Umeclidin-Vilant (TRELEGY ELLIPTA) 200-62.5-25 MCG/ACT AEPB Inhale 1 puff into the lungs daily.   loratadine (CLARITIN) 10 MG tablet Take 1 tablet (10 mg total) by mouth daily.   Magnesium 250 MG TABS Take 1 tablet by mouth daily.   methocarbamol (ROBAXIN) 500 MG tablet Take 1 tablet (500 mg total) by mouth every 8 (eight) hours as needed for muscle spasms.   methylPREDNISolone (MEDROL DOSEPAK) 4 MG TBPK tablet Day 1: Take 8 mg (2 tablets) before breakfast, 4 mg (1 tablet) after lunch, 4 mg (1 tablet) after supper, and 8 mg (2 tablets) at bedtime. Day 2:Take 4 mg (1 tablet) before breakfast, 4 mg (1 tablet) after lunch, 4 mg (1 tablet) after supper, and 8 mg (2 tablets) at bedtime. Day 3: Take 4 mg (1 tablet) before breakfast, 4 mg (1 tablet) after lunch, 4 mg (1 tablet) after supper, and 4 mg (1 tablet) at bedtime. Day 4: Take 4 mg (1 tablet) before breakfast, 4 mg (1 tablet) after lunch, and 4 mg (1 tablet) at bedtime. Day 5: Take 4 mg (1 tablet) before breakfast and 4 mg (1 tablet) at bedtime. Day 6: Take 4 mg (1 tablet) before breakfast.   montelukast (SINGULAIR) 10 MG tablet Take 1 tablet (10 mg total) by mouth at bedtime.   Multiple Vitamin (MULTIVITAMIN ADULT PO) Take by mouth.   Na Sulfate-K Sulfate-Mg Sulf 17.5-3.13-1.6 GM/177ML SOLN Take by mouth.   ondansetron (ZOFRAN) 4 MG tablet Take 1 tablet (4 mg total) by mouth every 8 (eight) hours as needed for nausea or vomiting.   rosuvastatin (CRESTOR) 10 MG tablet Take 1 tablet (10 mg total) by mouth daily.   tolterodine (DETROL LA) 2 MG 24 hr capsule Take  1 capsule (2 mg total) by mouth daily.   valsartan-hydrochlorothiazide (DIOVAN-HCT) 160-12.5 MG tablet Take 1 tablet by mouth daily.   vitamin C (ASCORBIC ACID) 500 MG tablet Take 1 tablet by mouth daily.   zolpidem (AMBIEN CR) 12.5 MG CR tablet Take 1 tablet (12.5 mg total) by mouth at bedtime.   Immunization History  Administered Date(s) Administered   Fluad Quad(high Dose 65+) 12/13/2018, 01/09/2020, 12/24/2020   Influenza, High Dose Seasonal PF 01/20/2016, 12/15/2016, 01/19/2018   Influenza, Seasonal, Injecte, Preservative Fre 02/18/2010, 02/11/2011, 03/29/2012   Influenza,inj,Quad PF,6+ Mos 01/03/2013, 01/10/2014, 03/05/2015   Influenza-Unspecified 12/17/2013   Moderna Sars-Covid-2 Vaccination 05/26/2019, 06/26/2019, 02/20/2020   Pneumococcal Conjugate-13 06/19/2014   Pneumococcal Polysaccharide-23 02/18/2010, 07/02/2015   Td 08/12/2016   Tdap 09/14/2007   Zoster Recombinat (Shingrix) 06/03/2021   Zoster, Live 08/20/2015        Objective:   Physical Exam BP 126/78 (BP Location: Left Arm, Cuff Size: Normal)   Pulse 68  Temp 97.8 F (36.6 C) (Temporal)   Ht '5\' 6"'$  (1.676 m)   Wt 186 lb (84.4 kg)   SpO2 99%   BMI 30.02 kg/m  GENERAL: Well-developed, overweight woman in no acute distress, well groomed.  Fully ambulatory.  No conversational dyspnea.  Speech clear. HEAD: Normocephalic, atraumatic.  Nasal quality to speech. EYES: Pupils equal, round, reactive to light.  No scleral icterus.  MOUTH: Nose/mouth/throat not examined due to masking requirements for COVID 19 (patient still wears a mask). NECK: Supple. No thyromegaly. Trachea midline. No JVD.  No adenopathy. PULMONARY: Good air entry bilaterally.  No adventitious sounds. CARDIOVASCULAR: S1 and S2. Regular rate and rhythm.  Grade 1/6 systolic ejection murmur left sternal border.  No gallops or rubs.   GASTROINTESTINAL: Benign. MUSCULOSKELETAL: No joint deformity, no clubbing, no edema.  NEUROLOGIC: No focal deficit,  no gait disturbance, speech is fluent.   SKIN: Intact,warm,dry.  No overt rashes noted. PSYCH: Mood and behavior appropriate     Assessment & Plan:     ICD-10-CM   1. Severe persistent asthma without complication  D14.97    Continue Trelegy Ellipta 200 Continue Dupixent Discontinue Singulair Continue as needed albuterol    2. Chronic rhinosinusitis  J31.0    J32.9    Markedly improved on Dupixent Continue Dupixent Discontinue Singulair    3. Chronic cough - resolved  R05.3    This symptom has resolved on Dupixent Likely related to persistent asthma     Patient is doing well with her [place] therapy.  We will see her in follow-up in 4 months time she is to contact us prior to that time should any new difficulties arise.  Renold Don, MD Advanced Bronchoscopy PCCM [facility]    *This note was dictated using voice recognition software/Dragon.  Despite best efforts to proofread, errors can occur which can change the meaning. Any transcriptional errors that result from this process are unintentional and may not be fully corrected at the time of dictation.

## 2021-10-07 DIAGNOSIS — M1611 Unilateral primary osteoarthritis, right hip: Secondary | ICD-10-CM | POA: Diagnosis not present

## 2021-10-07 DIAGNOSIS — M7061 Trochanteric bursitis, right hip: Secondary | ICD-10-CM | POA: Diagnosis not present

## 2021-10-14 ENCOUNTER — Ambulatory Visit
Admission: RE | Admit: 2021-10-14 | Discharge: 2021-10-14 | Disposition: A | Discharge status: Home / Self Care | Payer: BC Managed Care – PPO | Source: Ambulatory Visit | Attending: Family Medicine | Admitting: Family Medicine

## 2021-10-14 DIAGNOSIS — Z780 Asymptomatic menopausal state: Secondary | ICD-10-CM | POA: Diagnosis not present

## 2021-10-14 DIAGNOSIS — Z1231 Encounter for screening mammogram for malignant neoplasm of breast: Secondary | ICD-10-CM | POA: Insufficient documentation

## 2021-10-14 DIAGNOSIS — M85832 Other specified disorders of bone density and structure, left forearm: Secondary | ICD-10-CM | POA: Diagnosis not present

## 2021-10-15 ENCOUNTER — Other Ambulatory Visit: Payer: Self-pay | Admitting: Family Medicine

## 2021-10-15 DIAGNOSIS — R928 Other abnormal and inconclusive findings on diagnostic imaging of breast: Secondary | ICD-10-CM

## 2021-10-15 DIAGNOSIS — N6489 Other specified disorders of breast: Secondary | ICD-10-CM

## 2021-10-18 ENCOUNTER — Other Ambulatory Visit: Payer: Self-pay

## 2021-10-18 DIAGNOSIS — R928 Other abnormal and inconclusive findings on diagnostic imaging of breast: Secondary | ICD-10-CM

## 2021-11-05 DIAGNOSIS — M1611 Unilateral primary osteoarthritis, right hip: Secondary | ICD-10-CM | POA: Diagnosis not present

## 2021-11-08 ENCOUNTER — Ambulatory Visit
Admission: RE | Admit: 2021-11-08 | Discharge: 2021-11-08 | Disposition: A | Discharge status: Home / Self Care | Payer: BC Managed Care – PPO | Source: Ambulatory Visit | Attending: Family Medicine | Admitting: Family Medicine

## 2021-11-08 ENCOUNTER — Telehealth: Payer: Self-pay | Admitting: Pulmonary Disease

## 2021-11-08 DIAGNOSIS — R928 Other abnormal and inconclusive findings on diagnostic imaging of breast: Secondary | ICD-10-CM | POA: Insufficient documentation

## 2021-11-08 DIAGNOSIS — N6489 Other specified disorders of breast: Secondary | ICD-10-CM | POA: Insufficient documentation

## 2021-11-08 NOTE — Telephone Encounter (Signed)
I do not see documentation of our office attempting to contact patient.   Spoke to patient. She stated that she received a voicemail from our office but she does not recall the name as she deleted the message.  Nothing further needed.

## 2021-12-14 ENCOUNTER — Other Ambulatory Visit: Payer: Self-pay | Admitting: Pulmonary Disease

## 2021-12-14 DIAGNOSIS — J4550 Severe persistent asthma, uncomplicated: Secondary | ICD-10-CM

## 2021-12-15 ENCOUNTER — Telehealth: Payer: Self-pay

## 2021-12-15 NOTE — Telephone Encounter (Signed)
PA renewal initiated automatically by CoverMyMeds.  Submitted a Prior Authorization request to [facility] [place] for [place] via CoverMyMeds. Will update once we receive a response.   Key: [id number]

## 2021-12-15 NOTE — Telephone Encounter (Signed)
Received notification from [facility] regarding a prior authorization for Hayley Howe. Authorization has been APPROVED from 12/15/2021 to 04/17/2022. Approval letter sent to scan center.  Authorization # [id number]

## 2021-12-16 ENCOUNTER — Telehealth: Payer: Self-pay | Admitting: Family Medicine

## 2021-12-16 ENCOUNTER — Telehealth: Payer: Self-pay | Admitting: Pharmacist

## 2021-12-16 NOTE — Telephone Encounter (Signed)
Received fax from [place] requesting PA renewal for [place]. Patient has [employer] Medicare part D and commercial coverage through work. She has been filling through [place]  Key: Roxanne Mins, PharmD, MPH, BCPS, CPP Clinical Pharmacist (Rheumatology and Pulmonology)

## 2021-12-16 NOTE — Telephone Encounter (Signed)
Copied from [place] [id number]. Topic: General - Inquiry >> Dec 16, 2021 12:03 PM Rosanne Ashing P wrote: Reason for CRM: Pt has hip replacement surgery sept 7th   she is coming in on the 5th for a fu with Dr. Ancil Boozer.  Her potassium and it shows she was a little anemic.  She is wanting to know how she can bring these levels up by hr surgery date.  Her surgeon wanted her to ask her primary.  CB@  [phone]

## 2021-12-17 NOTE — Telephone Encounter (Signed)
No answer from pt left vm to return call.

## 2021-12-17 NOTE — Telephone Encounter (Signed)
Unfortunately no openings available with Dr.Sowles today. Pt has an appt on Sep 5th

## 2021-12-17 NOTE — Telephone Encounter (Signed)
Patient returned phone call. Office was out for lunch. Please return call to patient.

## 2021-12-17 NOTE — Progress Notes (Unsigned)
Name: Hayley Howe   MRN: [id number]    DOB: 1945-05-25   Date:12/21/2021       Progress Note  Subjective  Chief Complaint  Follow Up  HPI  Urge incontinence: it is intermittent and takes Detrol prn only,symptoms are stable.    Chronic sinusitis/[place] : she was seen by Dr. Duwayne Heck 21  - pulmonologist , had a normal spirometry, CT chest  showed some scarring, CT sinus showed chronic sinusitis back in 2020  She is now on Dupixent , singulair at night and also Trelegy,admitted for a flare in April but she is feeling well now, symptoms are under control. Denies cough, wheezing or sob    Chronic Low Back: she has a long history of low back pain, not secondary to trauma, DDD lumbar spine and also scoliosis. taking Tramadol daily to control symptoms (takes 1 daily, takes 2nd tablets a few times a week), she is also on Skelaxin prn unable to tolerate Flexeril - it makes her sleepy. 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 She sleeps from around 2 pm-8:30pm for about 5-7 hours when she sleeps during the day She takes Ambien , denies side effects of medication, she is aware of FDA guidelines however unable to sleep with lower dose of Ambien. She only takes medications the nights that she works. Advised not to take Ambien while taking pain meds from ortho after surgery    OA:  s/p left knee replaced, very seldom has pain or discomfort since  surgery was done by Dr. Rudene Christians in 2012 , she will have right hip replacement by Dr. Harlow Mares this week   Hyperglycemia: last hgbA1C was 6 % denies polyphagia, polydipsia or polyuria.  Continue low carb diet . We will recheck labs   HTN: taking valsartan hctz, and also Norvasc, she states no  longer having dizziness, no chest pain or palpitation .BP is towards low end of normal but no orthostatic symptoms    Dyslipidemia: last LDL stable on Crestor , no myalgia. Last LDL was at goal for her reviewed last labs   Senile Purpura: usually on arms , stable.   Patient Active Problem List   Diagnosis Date Noted   Primary osteoarthritis of right hip 12/21/2021   Anemia 12/21/2021   HTN (hypertension) 08/06/2021   HLD (hyperlipidemia) 08/06/2021   Kidney lesion 08/06/2021   Chronic rhinitis 07/02/2021   Senile purpura ([place]) 05/14/2020   Moderate persistent asthma with allergic rhinitis without complication 73/53/2992   Pre-diabetes 05/14/2020   Hyperglycemia 07/13/2017   Chronic constipation 12/15/2016   Primary osteoarthritis of left hip 11/05/2015   Scoliosis 11/05/2015   Chronic right-sided low back pain with right-sided sciatica 10/30/2014   Benign essential HTN 10/27/2014   Bradycardia 10/27/2014   Chronic insomnia 10/27/2014   Headache, temporal 10/27/2014   Hypercholesteremia 10/27/2014   Obesity (BMI 30-39.9) 10/27/2014   Changing sleep-work schedule, affecting sleep 10/27/2014   Vitamin D deficiency 10/27/2014   Arthritis of knee, degenerative 09/24/2009    Past Surgical History:  Procedure Laterality Date   ABDOMINAL HYSTERECTOMY     BREAST BIOPSY Right 01/21/2013   stereo - benign   COLONOSCOPY WITH PROPOFOL N/A 06/30/2016  Procedure: COLONOSCOPY WITH PROPOFOL;  Surgeon: Jonathon Bellows, MD;  Location: [facility] ENDOSCOPY;  Service: Endoscopy;  Laterality: N/A;   COLONOSCOPY WITH PROPOFOL N/A 09/02/2021   Procedure: COLONOSCOPY WITH PROPOFOL;  Surgeon: Jonathon Bellows, MD;  Location: [facility] ENDOSCOPY;  Service: Gastroenterology;  Laterality: N/A;   KNEE SURGERY Left 2012   Dr. Rudene Christians   OOPHORECTOMY     TUBAL LIGATION     VAGINAL PROLAPSE REPAIR      Family History  Problem Relation Age of Onset   Breast cancer Sister 47       in year 2013   Hyperlipidemia Mother     Diabetes Mother    Cancer Father        Leukemia   Hyperlipidemia Father    Lung cancer Brother    Breast cancer Sister    Heart failure Brother     Social History   Tobacco Use   Smoking status: Former    Packs/day: 0.25    Years: 1.00    Total pack years: 0.25    Types: Cigarettes    Start date: 08/13/1967    Quit date: 04/18/1977    Years since quitting: 44.7   Smokeless tobacco: Never   Tobacco comments:    smoking cessation materials not required  Substance Use Topics   Alcohol use: No    Alcohol/week: 0.0 standard drinks of alcohol     Current Outpatient Medications:    albuterol (VENTOLIN HFA) 108 (90 Base) MCG/ACT inhaler, Inhale 2 puffs into the lungs every 6 (six) hours as needed for wheezing or shortness of breath., Disp: 8 g, Rfl: 6   amLODipine (NORVASC) 10 MG tablet, Take 1 tablet (10 mg total) by mouth daily., Disp: 90 tablet, Rfl: 1   aspirin EC 81 MG tablet, Take 1 tablet (81 mg total) by mouth daily., Disp: 30 tablet, Rfl: 0   Azelastine-Fluticasone 137-50 MCG/ACT SUSP, Place 1 spray into both nostrils 2 (two) times daily., Disp: , Rfl:    Cholecalciferol (VITAMIN D) 2000 UNITS tablet, Take 1 tablet by mouth daily., Disp: , Rfl:    DUPIXENT 300 MG/2ML SOPN, INJECT 1 PEN SUBCUTANEOUSLY EVERY 14 DAYS., Disp: 4 mL, Rfl: 0   Fluticasone-Umeclidin-Vilant (TRELEGY ELLIPTA) 200-62.5-25 MCG/ACT AEPB, Inhale 1 puff into the lungs daily., Disp: 28 each, Rfl: 11   loratadine (CLARITIN) 10 MG tablet, Take 1 tablet (10 mg total) by mouth daily., Disp: 30 tablet, Rfl: 0   Magnesium 250 MG TABS, Take 1 tablet by mouth daily., Disp: , Rfl:    Multiple Vitamin (MULTIVITAMIN ADULT PO), Take by mouth., Disp: , Rfl:    rosuvastatin (CRESTOR) 10 MG tablet, Take 1 tablet (10 mg total) by mouth daily., Disp: 90 tablet, Rfl: 1   tolterodine (DETROL LA) 2 MG 24 hr capsule, Take 1 capsule (2 mg total) by mouth daily., Disp: 90 capsule, Rfl: 1   valsartan-hydrochlorothiazide  (DIOVAN-HCT) 160-12.5 MG tablet, Take 1 tablet by mouth daily., Disp: 90 tablet, Rfl: 1   vitamin C (ASCORBIC ACID) 500 MG tablet, Take 1 tablet by mouth daily., Disp: , Rfl:    zolpidem (AMBIEN CR) 12.5 MG CR tablet, Take 1 tablet (12.5 mg total) by mouth at bedtime., Disp: 90 tablet, Rfl: 0  No Known Allergies  I personally reviewed active problem list, medication list, allergies, family history, social history, health maintenance with the patient/caregiver today.   ROS  Constitutional: Negative for fever or weight change.  Respiratory: Negative for cough and shortness of breath.  Cardiovascular: Negative for chest pain or palpitations.  Gastrointestinal: Negative for abdominal pain, no bowel changes.  Musculoskeletal: positive for gait problem or joint swelling.  Skin: Negative for rash.  Neurological: Negative for dizziness or headache.  No other specific complaints in a complete review of systems (except as listed in HPI above).   Objective  Vitals:   12/21/21 1358  BP: 126/78  Pulse: 87  Resp: 16  Temp: 98.2 F (36.8 C)  TempSrc: Oral  SpO2: 98%  Weight: 179 lb 4.8 oz (81.3 kg)  Height: '5\' 4"'$  (1.626 m)    Body mass index is 30.78 kg/m.  Physical Exam   Constitutional: Patient appears well-developed and well-nourished. Obese  No distress.  HEENT: head atraumatic, normocephalic, pupils equal and reactive to light,, neck supple Cardiovascular: Normal rate, regular rhythm and normal heart sounds.  No murmur heard. No BLE edema. Pulmonary/Chest: Effort normal and breath sounds normal. No respiratory distress. Abdominal: Soft.  There is no tenderness. Muscular skeletal: using a walker  Psychiatric: Patient has a normal mood and affect. behavior is normal. Judgment and thought content normal.    PHQ2/9:    12/21/2021    1:57 PM 09/30/2021    8:03 AM 08/20/2021    8:04 AM 07/22/2021    8:36 AM 04/22/2021    9:03 AM  Depression screen PHQ 2/9  Decreased Interest 0 0 0  0 0  Down, Depressed, Hopeless 0 0 0 0 0  PHQ - 2 Score 0 0 0 0 0  Altered sleeping 0  0 0   Tired, decreased energy 0  0 0   Change in appetite 0  0 0   Feeling bad or failure about yourself  0  0 0   Trouble concentrating 0  0 0   Moving slowly or fidgety/restless 0  0 0   Suicidal thoughts 0  0 0   PHQ-9 Score 0  0 0     phq 9 is negative   Fall Risk:    12/21/2021    1:57 PM 09/30/2021    8:02 AM 08/20/2021    8:03 AM 07/22/2021    8:36 AM 04/22/2021    9:05 AM  Fall Risk   Falls in the past year? 0 0 0 0 0  Number falls in past yr:  0 0 0 0  Injury with Fall?  0 0 0 0  Risk for fall due to : Impaired balance/gait  No Fall Risks No Fall Risks No Fall Risks  Follow up Falls prevention discussed;Education provided;Falls evaluation completed Falls evaluation completed Falls prevention discussed Falls prevention discussed Falls prevention discussed     Functional Status Survey: Is the patient deaf or have difficulty hearing?: No Does the patient have difficulty seeing, even when wearing glasses/contacts?: No Does the patient have difficulty concentrating, remembering, or making decisions?: No Does the patient have difficulty walking or climbing stairs?: Yes Does the patient have difficulty dressing or bathing?: No Does the patient have difficulty doing errands alone such as visiting a doctor's office or shopping?: No    Assessment & Plan  1. Senile purpura ([place])  Reassurance given   2. Primary osteoarthritis of right hip  Having surgery this week, advised to hold ambien when taking narcotics from Ortho and also to not take Tramadol, call me back once she finishes medication surgeon gives to her   3. Benign essential HTN  - CBC with Differential/Platelet - COMPLETE METABOLIC PANEL WITH GFR - amLODipine (NORVASC) 10  MG tablet; Take 1 tablet (10 mg total) by mouth daily.  Dispense: 90 tablet; Refill: 1 - valsartan-hydrochlorothiazide (DIOVAN-HCT) 160-12.5 MG tablet; Take  1 tablet by mouth daily.  Dispense: 90 tablet; Refill: 1  4. Hyperglycemia   5. Anemia, unspecified type  - CBC with Differential/Platelet  6. Hypercholesteremia  - rosuvastatin (CRESTOR) 10 MG tablet; Take 1 tablet (10 mg total) by mouth daily.  Dispense: 90 tablet; Refill: 1  7. Chronic insomnia   8. Moderate persistent asthma with allergic rhinitis without complication

## 2021-12-17 NOTE — Telephone Encounter (Signed)
Received notification from [facility] regarding a prior authorization for [place]. Authorization has been APPROVED from 12/16/21 to 12/17/22.   Patient must continue to fill through  [facility] # [phone]  [facility] to advise that prior authorization is now approved. Rep able to reprocess claim successfully. They will reach out to the patient to schedule shipment  Knox Saliva, PharmD, MPH, BCPS, CPP Clinical Pharmacist (Rheumatology and Pulmonology)

## 2021-12-21 ENCOUNTER — Encounter: Payer: Self-pay | Admitting: Family Medicine

## 2021-12-21 ENCOUNTER — Ambulatory Visit (INDEPENDENT_AMBULATORY_CARE_PROVIDER_SITE_OTHER): Payer: BC Managed Care – PPO | Admitting: Family Medicine

## 2021-12-21 VITALS — BP 126/78 | HR 87 | Temp 98.2°F | Resp 16 | Ht 64.0 in | Wt 179.3 lb

## 2021-12-21 DIAGNOSIS — R739 Hyperglycemia, unspecified: Secondary | ICD-10-CM

## 2021-12-21 DIAGNOSIS — E7800 Pure hypercholesterolemia, unspecified: Secondary | ICD-10-CM

## 2021-12-21 DIAGNOSIS — M1611 Unilateral primary osteoarthritis, right hip: Secondary | ICD-10-CM

## 2021-12-21 DIAGNOSIS — J4540 Moderate persistent asthma, uncomplicated: Secondary | ICD-10-CM

## 2021-12-21 DIAGNOSIS — I10 Essential (primary) hypertension: Secondary | ICD-10-CM

## 2021-12-21 DIAGNOSIS — F5104 Psychophysiologic insomnia: Secondary | ICD-10-CM

## 2021-12-21 DIAGNOSIS — D649 Anemia, unspecified: Secondary | ICD-10-CM | POA: Diagnosis not present

## 2021-12-21 DIAGNOSIS — D692 Other nonthrombocytopenic purpura: Secondary | ICD-10-CM

## 2021-12-21 MED ORDER — VALSARTAN-HYDROCHLOROTHIAZIDE 160-12.5 MG PO TABS: 1.0000 | ORAL_TABLET | Freq: Every day | ORAL | 1 refills | Status: DC

## 2021-12-21 MED ORDER — ROSUVASTATIN CALCIUM 10 MG PO TABS: 10.0000 mg | ORAL_TABLET | Freq: Every day | ORAL | 1 refills | Status: DC

## 2021-12-21 MED ORDER — AMLODIPINE BESYLATE 10 MG PO TABS: 10.0000 mg | ORAL_TABLET | Freq: Every day | ORAL | 1 refills | Status: DC

## 2021-12-22 ENCOUNTER — Other Ambulatory Visit: Payer: Self-pay

## 2021-12-22 DIAGNOSIS — D649 Anemia, unspecified: Secondary | ICD-10-CM

## 2021-12-23 ENCOUNTER — Ambulatory Visit: Payer: BC Managed Care – PPO | Admitting: Family Medicine

## 2021-12-23 DIAGNOSIS — Z7951 Long term (current) use of inhaled steroids: Secondary | ICD-10-CM | POA: Diagnosis not present

## 2021-12-23 DIAGNOSIS — M25751 Osteophyte, right hip: Secondary | ICD-10-CM | POA: Diagnosis not present

## 2021-12-23 DIAGNOSIS — Z79620 Long term (current) use of immunosuppressive biologic: Secondary | ICD-10-CM | POA: Diagnosis not present

## 2021-12-23 DIAGNOSIS — M1611 Unilateral primary osteoarthritis, right hip: Secondary | ICD-10-CM | POA: Diagnosis not present

## 2021-12-23 DIAGNOSIS — J45909 Unspecified asthma, uncomplicated: Secondary | ICD-10-CM | POA: Diagnosis not present

## 2021-12-23 DIAGNOSIS — I10 Essential (primary) hypertension: Secondary | ICD-10-CM | POA: Diagnosis not present

## 2021-12-23 DIAGNOSIS — G4700 Insomnia, unspecified: Secondary | ICD-10-CM | POA: Diagnosis not present

## 2021-12-23 DIAGNOSIS — E785 Hyperlipidemia, unspecified: Secondary | ICD-10-CM | POA: Diagnosis not present

## 2021-12-23 LAB — IRON,TIBC AND FERRITIN PANEL
%SAT: 24 % (calc) (ref 16–45)
Ferritin: 82 ng/mL (ref 16–288)
Iron: 71 ug/dL (ref 45–160)
TIBC: 295 mcg/dL (calc) (ref 250–450)

## 2021-12-23 LAB — COMPLETE METABOLIC PANEL WITH GFR
AG Ratio: 1.4 (calc) (ref 1.0–2.5)
ALT: 38 U/L — ABNORMAL HIGH (ref 6–29)
AST: 34 U/L (ref 10–35)
Albumin: 4.2 g/dL (ref 3.6–5.1)
Alkaline phosphatase (APISO): 59 U/L (ref 37–153)
BUN: 21 mg/dL (ref 7–25)
CO2: 27 mmol/L (ref 20–32)
Calcium: 10.2 mg/dL (ref 8.6–10.4)
Chloride: 102 mmol/L (ref 98–110)
Creat: 0.83 mg/dL (ref 0.60–1.00)
Globulin: 3 g/dL (calc) (ref 1.9–3.7)
Glucose, Bld: 87 mg/dL (ref 65–99)
Potassium: 4.3 mmol/L (ref 3.5–5.3)
Sodium: 138 mmol/L (ref 135–146)
Total Bilirubin: 0.2 mg/dL (ref 0.2–1.2)
Total Protein: 7.2 g/dL (ref 6.1–8.1)
eGFR: 74 mL/min/{1.73_m2} (ref >=60)

## 2021-12-23 LAB — TEST AUTHORIZATION

## 2021-12-23 LAB — CBC WITH DIFFERENTIAL/PLATELET
Absolute Monocytes: 351 cells/uL (ref 200–950)
Basophils Absolute: 59 cells/uL (ref 0–200)
Basophils Relative: 1.1 %
Eosinophils Absolute: 92 cells/uL (ref 15–500)
Eosinophils Relative: 1.7 %
HCT: 35.7 % (ref 35.0–45.0)
Hemoglobin: 11.6 g/dL — ABNORMAL LOW (ref 11.7–15.5)
Lymphs Abs: 2646 cells/uL (ref 850–3900)
MCH: 27.6 pg (ref 27.0–33.0)
MCHC: 32.5 g/dL (ref 32.0–36.0)
MCV: 84.8 fL (ref 80.0–100.0)
MPV: 10 fL (ref 7.5–12.5)
Monocytes Relative: 6.5 %
Neutro Abs: 2252 cells/uL (ref 1500–7800)
Neutrophils Relative %: 41.7 %
Platelets: 313 10*3/uL (ref 140–400)
RBC: 4.21 10*6/uL (ref 3.80–5.10)
RDW: 14.5 % (ref 11.0–15.0)
Total Lymphocyte: 49 %
WBC: 5.4 10*3/uL (ref 3.8–10.8)

## 2021-12-23 NOTE — Progress Notes (Signed)
Called and left voicemail for call back

## 2021-12-24 DIAGNOSIS — E785 Hyperlipidemia, unspecified: Secondary | ICD-10-CM | POA: Diagnosis not present

## 2021-12-24 DIAGNOSIS — Z79620 Long term (current) use of immunosuppressive biologic: Secondary | ICD-10-CM | POA: Diagnosis not present

## 2021-12-24 DIAGNOSIS — M25751 Osteophyte, right hip: Secondary | ICD-10-CM | POA: Diagnosis not present

## 2021-12-24 DIAGNOSIS — J45909 Unspecified asthma, uncomplicated: Secondary | ICD-10-CM | POA: Diagnosis not present

## 2021-12-24 DIAGNOSIS — M1611 Unilateral primary osteoarthritis, right hip: Secondary | ICD-10-CM | POA: Diagnosis not present

## 2021-12-24 DIAGNOSIS — Z7951 Long term (current) use of inhaled steroids: Secondary | ICD-10-CM | POA: Diagnosis not present

## 2021-12-24 DIAGNOSIS — I10 Essential (primary) hypertension: Secondary | ICD-10-CM | POA: Diagnosis not present

## 2021-12-24 DIAGNOSIS — G4700 Insomnia, unspecified: Secondary | ICD-10-CM | POA: Diagnosis not present

## 2021-12-30 DIAGNOSIS — Z96641 Presence of right artificial hip joint: Secondary | ICD-10-CM | POA: Diagnosis not present

## 2022-01-03 DIAGNOSIS — Z96641 Presence of right artificial hip joint: Secondary | ICD-10-CM | POA: Diagnosis not present

## 2022-01-05 DIAGNOSIS — Z96649 Presence of unspecified artificial hip joint: Secondary | ICD-10-CM | POA: Insufficient documentation

## 2022-01-07 DIAGNOSIS — Z96641 Presence of right artificial hip joint: Secondary | ICD-10-CM | POA: Diagnosis not present

## 2022-01-25 DIAGNOSIS — Z96641 Presence of right artificial hip joint: Secondary | ICD-10-CM | POA: Diagnosis not present

## 2022-01-31 ENCOUNTER — Other Ambulatory Visit: Payer: Self-pay | Admitting: Pulmonary Disease

## 2022-01-31 DIAGNOSIS — J4550 Severe persistent asthma, uncomplicated: Secondary | ICD-10-CM

## 2022-02-01 ENCOUNTER — Telehealth: Payer: Self-pay | Admitting: Pulmonary Disease

## 2022-02-01 NOTE — Telephone Encounter (Signed)
Dupixent was sent to pharmacy 01/31/2022.  Pharmacy team, can you guys assist with this?

## 2022-02-02 NOTE — Telephone Encounter (Signed)
[place] for [place] rx status. Shipment is scheduled for tomorrow to her home. Spoke with patient - she confirmed she is aware. Nothing further needed.  Phone: [phone], option 3, option 3  German Manke, PharmD, MPH, BCPS, CPP Clinical Pharmacist (Rheumatology and Pulmonology)

## 2022-02-03 DIAGNOSIS — Z96641 Presence of right artificial hip joint: Secondary | ICD-10-CM | POA: Diagnosis not present

## 2022-02-07 DIAGNOSIS — Z96641 Presence of right artificial hip joint: Secondary | ICD-10-CM | POA: Diagnosis not present

## 2022-02-10 DIAGNOSIS — Z96641 Presence of right artificial hip joint: Secondary | ICD-10-CM | POA: Diagnosis not present

## 2022-02-22 DIAGNOSIS — Z96641 Presence of right artificial hip joint: Secondary | ICD-10-CM | POA: Diagnosis not present

## 2022-03-01 ENCOUNTER — Other Ambulatory Visit: Payer: Self-pay | Admitting: Pulmonary Disease

## 2022-03-01 ENCOUNTER — Telehealth: Payer: Self-pay | Admitting: Pulmonary Disease

## 2022-03-01 DIAGNOSIS — J4550 Severe persistent asthma, uncomplicated: Secondary | ICD-10-CM

## 2022-03-01 DIAGNOSIS — Z96641 Presence of right artificial hip joint: Secondary | ICD-10-CM | POA: Diagnosis not present

## 2022-03-01 NOTE — Telephone Encounter (Signed)
Patient called to ask the nurse to call regarding her medication for dupixent.  She stated that the pharmacy said she needed a new script.  Please advise.

## 2022-03-01 NOTE — Telephone Encounter (Signed)
Dupixent has been sent to preferred pharmacy.  Patient is aware and voiced her understanding.  Nothing further needed.

## 2022-03-08 DIAGNOSIS — Z96641 Presence of right artificial hip joint: Secondary | ICD-10-CM | POA: Diagnosis not present

## 2022-03-15 DIAGNOSIS — Z96641 Presence of right artificial hip joint: Secondary | ICD-10-CM | POA: Diagnosis not present

## 2022-03-17 ENCOUNTER — Ambulatory Visit (INDEPENDENT_AMBULATORY_CARE_PROVIDER_SITE_OTHER): Payer: BC Managed Care – PPO | Admitting: Pulmonary Disease

## 2022-03-17 ENCOUNTER — Encounter: Payer: Self-pay | Admitting: Pulmonary Disease

## 2022-03-17 VITALS — BP 120/78 | HR 60 | Temp 97.5°F | Ht 64.0 in | Wt 188.8 lb

## 2022-03-17 DIAGNOSIS — J329 Chronic sinusitis, unspecified: Secondary | ICD-10-CM

## 2022-03-17 DIAGNOSIS — J4550 Severe persistent asthma, uncomplicated: Secondary | ICD-10-CM | POA: Diagnosis not present

## 2022-03-17 LAB — NITRIC OXIDE: Nitric Oxide: 12

## 2022-03-17 MED ORDER — TRELEGY ELLIPTA 200-62.5-25 MCG/ACT IN AEPB: 1.0000 | INHALATION_SPRAY | Freq: Every day | RESPIRATORY_TRACT | 11 refills | Status: DC

## 2022-03-17 NOTE — Patient Instructions (Addendum)
You did not have inflammation in your airway today.  This is good news.  We are going to get breathing tests before you come back so that we can make sure that there has been no decline in lung function from prior.  I do recommend that you get the RSV vaccine, you may get this at the [facility].  Continue using your Trelegy for now.  If you are doing well on your follow-up visit we may consider decreasing the strength a little bit.  We will see you in follow-up in 4 to 6 months time call sooner should any new problems arise.

## 2022-03-17 NOTE — Progress Notes (Signed)
Subjective:    Patient ID: Hayley Howe, female    DOB: [birth year], 76 y.o.   MRN: [id number] Patient Care Team: Steele Sizer, MD as PCP - General (Family Medicine) Grant Fontana, [place] as Consulting Physician (Chiropractic Medicine) Beverly Gust, MD as Consulting Physician (Otolaryngology) Jonathon Bellows, MD as Consulting Physician (Gastroenterology) Tyler Pita, MD as Consulting Physician (Pulmonary Disease)  Chief Complaint  Patient presents with   Follow-up    Breathing is doing well. No SOB or wheezing. Cough with white sputum.    HPI Hayley Howe is a 76 year old very remote former smoker with minimal tobacco exposure, who presents for follow-up on the issue of severe persistent asthma , allergic phenotype,and chronic rhinosinusitis.  This is a scheduled visit.  She was last seen on 04 October 2021 by me.  At her prior visit we discontinued Singulair and she has tolerated this. She started Dupixent on March 2023, she has been tolerating it well.  Since starting she has noted steady improvement in her asthma symptoms and control.  She has not had any exacerbations, her cough has resolved.  She also notes clearing of her sinuses significantly.  She she is very compliant with her nasal hygiene and with her Trelegy and as needed albuterol.  She has not required as needed albuterol in months.  She has not had any fevers, chills or sweats.  No sputum production, no hemoptysis.  No orthopnea or paroxysmal nocturnal dyspnea.  No lower extremity edema, no calf tenderness.  Overall she feels well and looks well.   TEST/EVENTS : PFTs on February 28, 2019 showed normal lung function with an FEV1 at 87%, ratio 72, FVC 94%, no significant bronchodilator response CT chest November 2020 mild bronchial wall thickening with bronchial secretions and associated scarring and linear atelectasis in the right middle lobe, left upper lobe and lingula.  Otherwise clear with no evidence of interstitial lung  disease, findings of healed granulomatous disease in the right lower lobe CT sinus April 08, 2019 extensive acute on chronic sinusitis, air-fluid levels in the maxillary sinuses, minimal improvement and left frontal sinus and scattered ethmoid air cells January 2023 absolute eosinophil count 500, IgE 545, allergen panel negative except for Cladosporium Herbarum IgE (common mold)   Review of Systems A 10 point review of systems was performed and it is as noted above otherwise negative.  Patient Active Problem List   Diagnosis Date Noted   Primary osteoarthritis of right hip 12/21/2021   Anemia 12/21/2021   HTN (hypertension) 08/06/2021   HLD (hyperlipidemia) 08/06/2021   Kidney lesion 08/06/2021   Chronic rhinitis 07/02/2021   Senile purpura ([place]) 05/14/2020   Moderate persistent asthma with allergic rhinitis without complication 09/32/3557   Pre-diabetes 05/14/2020   Hyperglycemia 07/13/2017   Chronic constipation 12/15/2016   Primary osteoarthritis of left hip 11/05/2015   Scoliosis 11/05/2015   Chronic right-sided low back pain with right-sided sciatica 10/30/2014   Benign essential HTN 10/27/2014   Bradycardia 10/27/2014   Chronic insomnia 10/27/2014   Headache, temporal 10/27/2014   Hypercholesteremia 10/27/2014   Obesity (BMI 30-39.9) 10/27/2014   Changing sleep-work schedule, affecting sleep 10/27/2014   Vitamin D deficiency 10/27/2014   Arthritis of knee, degenerative 09/24/2009   Social History   Tobacco Use   Smoking status: Former    Packs/day: 0.25    Years: 1.00    Total pack years: 0.25    Types: Cigarettes    Start date: 08/13/1967    Quit date: 04/18/1977  Years since quitting: 44.9   Smokeless tobacco: Never   Tobacco comments:    smoking cessation materials not required  Substance Use Topics   Alcohol use: No    Alcohol/week: 0.0 standard drinks of alcohol   No Known Allergies  Current Meds  Medication Sig   albuterol (VENTOLIN HFA) 108 (90  Base) MCG/ACT inhaler Inhale 2 puffs into the lungs every 6 (six) hours as needed for wheezing or shortness of breath.   amLODipine (NORVASC) 10 MG tablet Take 1 tablet (10 mg total) by mouth daily.   aspirin EC 81 MG tablet Take 1 tablet (81 mg total) by mouth daily.   Azelastine-Fluticasone 137-50 MCG/ACT SUSP Place 1 spray into both nostrils 2 (two) times daily.   Cholecalciferol (VITAMIN D) 2000 UNITS tablet Take 1 tablet by mouth daily.   DUPIXENT 300 MG/2ML SOPN INJECT 1 PEN SUBCUTANEOUSLY EVERY TWO WEEKS   Fluticasone-Umeclidin-Vilant (TRELEGY ELLIPTA) 200-62.5-25 MCG/ACT AEPB Inhale 1 puff into the lungs daily.   HYDROcodone-acetaminophen (NORCO/VICODIN) 5-325 MG tablet Take 1 tablet every 6 hours by oral route as needed for 5 days.   loratadine (CLARITIN) 10 MG tablet Take 1 tablet (10 mg total) by mouth daily.   Magnesium 250 MG TABS Take 1 tablet by mouth daily.   Multiple Vitamin (MULTIVITAMIN ADULT PO) Take by mouth.   rosuvastatin (CRESTOR) 10 MG tablet Take 1 tablet (10 mg total) by mouth daily.   tolterodine (DETROL LA) 2 MG 24 hr capsule Take 1 capsule (2 mg total) by mouth daily.   valsartan-hydrochlorothiazide (DIOVAN-HCT) 160-12.5 MG tablet Take 1 tablet by mouth daily.   vitamin C (ASCORBIC ACID) 500 MG tablet Take 1 tablet by mouth daily.   zolpidem (AMBIEN CR) 12.5 MG CR tablet Take 1 tablet (12.5 mg total) by mouth at bedtime.   Immunization History  Administered Date(s) Administered   Fluad Quad(high Dose 65+) 12/13/2018, 01/09/2020, 12/24/2020   Influenza, High Dose Seasonal PF 01/20/2016, 12/15/2016, 01/19/2018, 12/17/2021   Influenza, Seasonal, Injecte, Preservative Fre 02/18/2010, 02/11/2011, 03/29/2012   Influenza,inj,Quad PF,6+ Mos 01/03/2013, 01/10/2014, 03/05/2015   Influenza-Unspecified 12/17/2013   Moderna Sars-Covid-2 Vaccination 05/26/2019, 06/26/2019, 02/20/2020   Pneumococcal Conjugate-13 06/19/2014   Pneumococcal Polysaccharide-23 02/18/2010,  07/02/2015   Td 08/12/2016   Tdap 09/14/2007   Zoster Recombinat (Shingrix) 06/03/2021   Zoster, Live 08/20/2015       Objective:   Physical Exam BP 120/78 (BP Location: Left Arm, Cuff Size: Normal)   Pulse 60   Temp (!) 97.5 F (36.4 C)   Ht '5\' 4"'$  (1.626 m)   Wt 188 lb 12.8 oz (85.6 kg)   SpO2 98%   BMI 32.41 kg/m  GENERAL: Well-developed, overweight woman in no acute distress, well groomed.  Fully ambulatory.  No conversational dyspnea.  Speech clear. HEAD: Normocephalic, atraumatic.  Nasal quality to speech. EYES: Pupils equal, round, reactive to light.  No scleral icterus.  MOUTH: Nose/mouth/throat not examined due to masking requirements for COVID 19 (patient still wears a mask). NECK: Supple. No thyromegaly. Trachea midline. No JVD.  No adenopathy. PULMONARY: Good air entry bilaterally.  No adventitious sounds. CARDIOVASCULAR: S1 and S2. Regular rate and rhythm.  Grade 1/6 systolic ejection murmur left sternal border.  No gallops or rubs.   GASTROINTESTINAL: Benign. MUSCULOSKELETAL: No joint deformity, no clubbing, no edema.  NEUROLOGIC: No focal deficit, no gait disturbance, speech is fluent.   SKIN: Intact,warm,dry.  No overt rashes noted. PSYCH: Mood and behavior appropriate  Lab Results  Component Value Date  NITRICOXIDE 12 03/17/2022  No evidence of type II inflammation indicating adequate regimen for control of asthma      Assessment & Plan:     ICD-10-CM   1. Severe persistent asthma without complication  S06.30 Nitric oxide    Pulmonary Function Test [facility] Only   Well-controlled on Dupixent, Trelegy and as needed albuterol Continue same dose of Trelegy at 200 for now Follow-up in 4 to 6 months or as needed.    2. Chronic rhinosinusitis  J32.9    Markedly improved on Dupixent, continue same Continue nasal hygiene     Orders Placed This Encounter  Procedures   Nitric oxide   Pulmonary Function Test [facility] Only    Standing Status:   Future     Standing Expiration Date:   03/18/2023    Order Specific Question:   Full PFT: includes the following: basic spirometry, spirometry pre & post bronchodilator, diffusion capacity (DLCO), lung volumes    Answer:   Full PFT    Order Specific Question:   This test can only be performed at    Answer:   [place] ordered this encounter  Medications   Fluticasone-Umeclidin-Vilant (TRELEGY ELLIPTA) 200-62.5-25 MCG/ACT AEPB    Sig: Inhale 1 puff into the lungs daily.    Dispense:  28 each    Refill:  11    Patient failed Advair and other inhalers.  Well-controlled on Trelegy.   We will reassess the patient's airway function with pulmonary function testing.  We will continue Trelegy at the current dose of 200 for now consideration of decreasing to the 100 dose on follow-up appointment.  We have recommended that the patient get the RSV vaccine.  Patient is to follow-up in 4 to 6 months time she is to contact us prior to that time should any new difficulties arise.

## 2022-03-20 ENCOUNTER — Other Ambulatory Visit: Payer: Self-pay | Admitting: Pulmonary Disease

## 2022-03-20 DIAGNOSIS — J4550 Severe persistent asthma, uncomplicated: Secondary | ICD-10-CM

## 2022-03-22 DIAGNOSIS — Z96641 Presence of right artificial hip joint: Secondary | ICD-10-CM | POA: Diagnosis not present

## 2022-04-25 ENCOUNTER — Ambulatory Visit (INDEPENDENT_AMBULATORY_CARE_PROVIDER_SITE_OTHER): Payer: Medicare HMO | Admitting: Family Medicine

## 2022-04-25 ENCOUNTER — Encounter: Payer: Self-pay | Admitting: Family Medicine

## 2022-04-25 ENCOUNTER — Other Ambulatory Visit: Payer: Self-pay | Admitting: Pulmonary Disease

## 2022-04-25 DIAGNOSIS — Z0000 Encounter for general adult medical examination without abnormal findings: Secondary | ICD-10-CM | POA: Diagnosis not present

## 2022-04-25 DIAGNOSIS — J4550 Severe persistent asthma, uncomplicated: Secondary | ICD-10-CM

## 2022-04-25 NOTE — Addendum Note (Signed)
Addended by: Myles Gip on: 04/25/2022 01:43 PM   Modules accepted: Level of Service

## 2022-04-25 NOTE — Patient Instructions (Signed)
It was great to see you!  Our plans for today:  - Bring a copy of your advanced directives to the clinic at your next appointment so we can have a copy on your chart.  Take care and seek immediate care sooner if you develop any concerns.   Dr. Ky Barban   Things to do to keep yourself healthy  - Exercise at least 30-45 minutes a day, 3-4 days a week.  - Eat a low-fat diet with lots of fruits and vegetables, up to 7-9 servings per day.  - Seatbelts can save your life. Wear them always.  - Smoke detectors on every level of your home, check batteries every year.  - Eye Doctor - have an eye exam every 1-2 years  - Safe sex - if you may be exposed to STDs, use a condom.  - Alcohol -  If you drink, do it moderately, less than 2 drinks per day.  - [place]. Choose someone to speak for you if you are not able. https://www.prepareforyourcare.org is a great website to help you navigate this. - Depression is common in our stressful world.If you're feeling down or losing interest in things you normally enjoy, please come in for a visit.  - Violence - If anyone is threatening or hurting you, please call immediately.

## 2022-04-25 NOTE — Progress Notes (Signed)
Subjective:   Hayley Howe is a 77 y.o. female who presents for Medicare Annual (Subsequent) preventive examination.       Objective:     Vitals: There were no vitals taken for this visit.  There is no height or weight on file to calculate BMI.     04/25/2022    8:46 AM 09/02/2021    9:58 AM 08/06/2021    8:12 AM 04/22/2021    9:04 AM 04/21/2020   12:31 PM 01/24/2019    9:03 AM 01/19/2018    9:23 AM  Advanced Directives  Does Patient Have a Medical Advance Directive? Yes Yes No Yes Yes Yes Yes  Type of Theatre stage manager of [place];Living will [place];Living will [place];Living will [place];Living will  Copy of [place] in Chart?    No - copy requested No - copy requested No - copy requested No - copy requested  Would patient like information on creating a medical advance directive?   No - Patient declined        Tobacco Social History   Tobacco Use  Smoking Status Former   Packs/day: 0.25   Years: 1.00   Total pack years: 0.25   Types: Cigarettes   Start date: 08/13/1967   Quit date: 04/18/1977   Years since quitting: 45.0  Smokeless Tobacco Never  Tobacco Comments   smoking cessation materials not required     Counseling given: Not Answered Tobacco comments: smoking cessation materials not required   Clinical Intake:                       Past Medical History:  Diagnosis Date   Asthma    Hyperlipidemia    Hypertension    Insomnia    Low back pain    Obesity    Osteoarthritis of left knee    Past Surgical History:  Procedure Laterality Date   ABDOMINAL HYSTERECTOMY     BREAST BIOPSY Right 01/21/2013   stereo - benign   COLONOSCOPY WITH PROPOFOL N/A 06/30/2016   Procedure: COLONOSCOPY WITH PROPOFOL;  Surgeon: Jonathon Bellows, MD;  Location: [facility] ENDOSCOPY;  Service: Endoscopy;  Laterality: N/A;   COLONOSCOPY WITH PROPOFOL N/A 09/02/2021    Procedure: COLONOSCOPY WITH PROPOFOL;  Surgeon: Jonathon Bellows, MD;  Location: [facility] ENDOSCOPY;  Service: Gastroenterology;  Laterality: N/A;   KNEE SURGERY Left 2012   Dr. Rudene Christians   OOPHORECTOMY     TUBAL LIGATION     VAGINAL PROLAPSE REPAIR     Family History  Problem Relation Age of Onset   Breast cancer Sister 45       in year 2013   Hyperlipidemia Mother    Diabetes Mother    Cancer Father        Leukemia   Hyperlipidemia Father    Lung cancer Brother    Breast cancer Sister    Heart failure Brother    Social History   Socioeconomic History   Marital status: Widowed    Spouse name: Not on file   Number of children: 2   Years of education: Not on file   Highest education level: 12th grade  Occupational History   Occupation: Education officer, museum: [id number]    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  Tobacco Use   Smoking status: Former    Packs/day: 0.25    Years: 1.00    Total pack years:  0.25    Types: Cigarettes    Start date: 08/13/1967    Quit date: 04/18/1977    Years since quitting: 45.0   Smokeless tobacco: Never   Tobacco comments:    smoking cessation materials not required  Vaping Use   Vaping Use: Never used  Substance and Sexual Activity   Alcohol use: No    Alcohol/week: 0.0 standard drinks of alcohol   Drug use: No   Sexual activity: Not Currently  Other Topics Concern   Not on file  Social History Narrative   Daughter lives with her and grandson   Still working full time at [employer] 3rd shift      Mother of Maranatha Grossi   Social Determinants of Health   Financial Resource Strain: Low Risk  (04/22/2021)   Overall Financial Resource Strain (CARDIA)    Difficulty of Paying Living Expenses: Not hard at all  Food Insecurity: No Food Insecurity (04/22/2021)   Hunger Vital Sign    Worried About Running Out of Food in the Last Year: Never true    [place] in the Last Year: Never true  Transportation Needs: No Transportation Needs (04/22/2021)   PRAPARE - Armed forces logistics/support/administrative officer (Medical): No    Lack of Transportation (Non-Medical): No  Physical Activity: Inactive (04/22/2021)   Exercise Vital Sign    Days of Exercise per Week: 0 days    Minutes of Exercise per Session: 0 min  Stress: No Stress Concern Present (04/22/2021)   [place]    Feeling of Stress : Not at all  Social Connections: Moderately Integrated (04/22/2021)   Social Connection and Isolation Panel [NHANES]    Frequency of Communication with Friends and Family: More than three times a week    Frequency of Social Gatherings with Friends and Family: Twice a week    Attends Religious Services: More than 4 times per year    Active Member of [employer] or Organizations: Yes    Attends Archivist Meetings: More than 4 times per year    Marital Status: Widowed    Outpatient Encounter Medications as of 04/25/2022  Medication Sig   albuterol (VENTOLIN HFA) 108 (90 Base) MCG/ACT inhaler Inhale 2 puffs into the lungs every 6 (six) hours as needed for wheezing or shortness of breath.   amLODipine (NORVASC) 10 MG tablet Take 1 tablet (10 mg total) by mouth daily.   aspirin EC 81 MG tablet Take 1 tablet (81 mg total) by mouth daily.   Azelastine-Fluticasone 137-50 MCG/ACT SUSP Place 1 spray into both nostrils 2 (two) times daily.   Cholecalciferol (VITAMIN D) 2000 UNITS tablet Take 1 tablet by mouth daily.   Dupilumab (DUPIXENT) 300 MG/2ML SOPN INJECT 1 PEN SUBCUTANEOUSLY EVERY TWO WEEKS   Fluticasone-Umeclidin-Vilant (TRELEGY ELLIPTA) 200-62.5-25 MCG/ACT AEPB Inhale 1 puff into the lungs daily.   HYDROcodone-acetaminophen (NORCO/VICODIN) 5-325 MG tablet Take 1 tablet every 6 hours by oral route as needed for 5 days.   loratadine (CLARITIN) 10 MG tablet Take 1 tablet (10 mg total) by mouth daily.   Magnesium 250 MG TABS Take 1 tablet by mouth daily.   Multiple Vitamin (MULTIVITAMIN ADULT PO) Take by mouth.   rosuvastatin  (CRESTOR) 10 MG tablet Take 1 tablet (10 mg total) by mouth daily.   tolterodine (DETROL LA) 2 MG 24 hr capsule Take 1 capsule (2 mg total) by mouth daily.   traMADol (ULTRAM) 50 MG  tablet Take 50 mg by mouth every 6 (six) hours as needed for severe pain (back pain).   valsartan-hydrochlorothiazide (DIOVAN-HCT) 160-12.5 MG tablet Take 1 tablet by mouth daily.   vitamin C (ASCORBIC ACID) 500 MG tablet Take 1 tablet by mouth daily.   zolpidem (AMBIEN CR) 12.5 MG CR tablet Take 1 tablet (12.5 mg total) by mouth at bedtime.   No facility-administered encounter medications on file as of 04/25/2022.    Activities of Daily Living    04/25/2022    8:22 AM 12/21/2021    1:58 PM  In your present state of health, do you have any difficulty performing the following activities:  Hearing? 0 0  Vision? 0 0  Difficulty concentrating or making decisions? 0 0  Walking or climbing stairs? 0 1  Dressing or bathing? 0 0  Doing errands, shopping? 0 0    Patient Care Team: Steele Sizer, MD as PCP - General (Family Medicine) Grant Fontana, [place] as Consulting Physician (Chiropractic Medicine) Beverly Gust, MD as Consulting Physician (Otolaryngology) Jonathon Bellows, MD as Consulting Physician (Gastroenterology) Tyler Pita, MD as Consulting Physician (Pulmonary Disease) Lovell Sheehan, MD as Consulting Physician (Orthopedic Surgery)    Assessment:   This is a routine wellness examination for [place].  Exercise Activities and Dietary recommendations     Goals Addressed   None     Fall Risk:    04/25/2022    8:22 AM 12/21/2021    1:57 PM 09/30/2021    8:02 AM 08/20/2021    8:03 AM 07/22/2021    8:36 AM  Fall Risk   Falls in the past year? 0 0 0 0 0  Number falls in past yr: 0  0 0 0  Injury with Fall? 0  0 0 0  Risk for fall due to :  Impaired balance/gait  No Fall Risks No Fall Risks  Follow up  Falls prevention discussed;Education provided;Falls evaluation completed Falls evaluation  completed Falls prevention discussed Falls prevention discussed    FALL RISK PREVENTION PERTAINING TO THE HOME:  Any stairs in or around the home? Yes  outside If so, are there any without handrails? Yes   Home free of loose throw rugs in walkways, pet beds, electrical cords, etc? Yes  Adequate lighting in your home to reduce risk of falls? Yes   ASSISTIVE DEVICES UTILIZED TO PREVENT FALLS:  Life alert? No  Use of a cane, walker or w/c? Yes  cane due to hip replacement Grab bars in the bathroom? Yes  Shower chair or bench in shower? No  Elevated toilet seat or a handicapped toilet? No   DME ORDERS:  DME order needed?  No   TIMED UP AND GO:  Was the test performed? No . Virtual visit  GAIT: Virtual visit not able to evaluate.   Depression Screen    04/25/2022    8:22 AM 12/21/2021    1:57 PM 09/30/2021    8:03 AM 08/20/2021    8:04 AM  PHQ 2/9 Scores  PHQ - 2 Score 0 0 0 0  PHQ- 9 Score 0 0  0     Cognitive Function        04/25/2022    8:40 AM 01/24/2019    9:06 AM 01/19/2018    9:24 AM  6CIT Screen  What Year? 0 points 0 points 0 points  What month? 0 points 0 points 0 points  What time? 0 points 0 points 0 points  Count back from  20 2 points 0 points 0 points  Months in reverse 0 points 0 points 2 points  Repeat phrase 0 points 0 points 4 points  Total Score 2 points 0 points 6 points    Immunization History  Administered Date(s) Administered   Fluad Quad(high Dose 65+) 12/13/2018, 01/09/2020, 12/24/2020   Influenza, High Dose Seasonal PF 01/20/2016, 12/15/2016, 01/19/2018, 12/17/2021   Influenza, Seasonal, Injecte, Preservative Fre 02/18/2010, 02/11/2011, 03/29/2012   Influenza,inj,Quad PF,6+ Mos 01/03/2013, 01/10/2014, 03/05/2015   Influenza-Unspecified 12/17/2013   Moderna Sars-Covid-2 Vaccination 05/26/2019, 06/26/2019, 02/20/2020   Pneumococcal Conjugate-13 06/19/2014   Pneumococcal Polysaccharide-23 02/18/2010, 07/02/2015   Rsv, Bivalent, Protein  Subunit Rsvpref,pf Evans Lance) 03/17/2022   Td 08/12/2016   Tdap 09/14/2007   Zoster Recombinat (Shingrix) 06/03/2021   Zoster, Live 08/20/2015    Qualifies for Shingles Vaccine? Yes  Zostavax completed [id number], 06/03/21. Due for Shingrix. Education has been provided regarding the importance of this vaccine. Pt has been advised to call insurance company to determine out of pocket expense. Advised may also receive vaccine at local pharmacy or Health Dept. Verbalized acceptance and understanding.  Tdap: Done 09/14/07  Flu Vaccine: Done 12/17/21  Pneumococcal Vaccine: Covid-19 Vaccine: 23- 07/02/15, 02/18/10 13-03/03/16  Screening Tests Health Maintenance  Topic Date Due   COVID-19 Vaccine (4 - 2023-24 season) 12/17/2021   MAMMOGRAM  10/15/2022   Medicare Annual Wellness (AWV)  04/26/2023   DTaP/Tdap/Td (3 - Td or Tdap) 08/13/2026   COLONOSCOPY (Pts 45-70yr Insurance coverage will need to be confirmed)  09/03/2026   Pneumonia Vaccine 75 Years old  Completed   INFLUENZA VACCINE  Completed   DEXA SCAN  Completed   Hepatitis C Screening  Completed   HPV VACCINES  Aged Out   Zoster Vaccines- Shingrix  Discontinued    Cancer Screenings:  Colorectal Screening: Completed 09/02/21. Repeat every 5 years; No longer required.   Mammogram: Completed [id number]. Repeat every year; No longer required.  Bone Density: Completed 10/14/21. Results reflect NORMAL,   Lung Cancer Screening: (Low Dose CT Chest recommended if Age 75-80years, 30 pack-year currently smoking OR have quit w/in 15years.) does not qualify.   Lung Cancer Screening Referral: An Epic message has been sent to SBurgess Estelle RN (Oncology Nurse Navigator) regarding the possible need for this exam. SRaquel Sarnawill review the patient's chart to determine if the patient truly qualifies for the exam. If the patient qualifies, SRaquel Sarnawill order the Low Dose CT of the chest to facilitate the scheduling of this exam.  Additional  Screening:  Hepatitis C Screening: does not qualify;  Vision Screening: Recommended annual ophthalmology exams for early detection of glaucoma and other disorders of the eye. Is the patient up to date with their annual eye exam?  No , has upcoming appointment.  Who is the provider or what is the name of the office in which the pt attends annual eye exams?  [facility]  Dental Screening: every 6 months  Community Resource Referral:  CRR required this visit?  No       Plan:  I have personally reviewed and addressed the Medicare Annual Wellness questionnaire and have noted the following in the patient's chart:  A. Medical and social history B. Use of alcohol, tobacco or illicit drugs  C. Current medications and supplements D. Functional ability and status E.  Nutritional status F.  Physical activity G. Advance directives H. List of other physicians I.  Hospitalizations, surgeries, and ER visits in previous 12 months J.  Vitals  K. Screenings such as hearing and vision if needed, cognitive and depression L. Referrals and appointments   In addition, I have reviewed and discussed with patient certain preventive protocols, quality metrics, and best practice recommendations. A written personalized care plan for preventive services as well as general preventive health recommendations were provided to patient.  Levora Angel, DO  04/25/2022

## 2022-04-26 ENCOUNTER — Ambulatory Visit: Payer: BC Managed Care – PPO

## 2022-04-28 NOTE — Progress Notes (Signed)
Name: Hayley Howe   MRN: [id number]    DOB: 03/11/1946   Date:04/29/2022       Progress Note  Subjective  Chief Complaint  Follow Up  HPI  Urge incontinence: she has been taking it every night, otherwise she will wake up multiple times to void, when she takes medication she only gets up once. No side effects of medications    Chronic sinusitis/[place] : she was seen by Dr. Duwayne Heck 21  - pulmonologist , had a normal spirometry, CT chest  showed some scarring, CT sinus showed chronic sinusitis back in 2020  She is now on Dupixent , singulair at night and also She needs refill of nasal spray. She feels medications has improved symptoms   Chronic Low Back: she has a long history of low back pain, not secondary to trauma, DDD lumbar spine and also scoliosis. taking Tramadol daily to control symptoms (takes 1 daily, takes 2nd tablets a few times a week), Pain is constant -pain right now is 5/10. She takes medication prn 90 pills to last 4 months  , she had hip surgery and Dr. Harlow Mares gave him the refills of Tramadol, she has an appointment with him next Friday. 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 She sleeps from around 2 pm-8:30pm for about 5-7 hours when she sleeps during the day She takes Ambien , denies side effects of medication, she is aware of FDA guidelines however unable to sleep with lower dose of Ambien. She only takes medications the nights that she works. She needs refills today   OA:  s/p left knee replaced, very seldom has pain or discomfort since  surgery was done by Dr. Rudene Christians in 2012 , she had  right hip replacement by Dr. Harlow Mares 12/2021, she finished PT, still seeing him for follow up   Hyperglycemia: last hgbA1C was 6.1 % denies polyphagia, polydipsia or polyuria.  Continue low  carb diet .  HTN: taking valsartan hctz, and also Norvasc, she states no longer having dizziness, no chest pain or palpitation .BP is towards low end of normal but no orthostatic symptoms    Dyslipidemia: last LDL stable on Crestor , no myalgia. Last LDL was 93  Senile Purpura: usually on arms , stable. Reassurance given again   Patient Active Problem List   Diagnosis Date Noted   History of total hip arthroplasty 01/05/2022   Primary osteoarthritis of right hip 12/21/2021   Anemia 12/21/2021   HTN (hypertension) 08/06/2021   HLD (hyperlipidemia) 08/06/2021   Kidney lesion 08/06/2021   Chronic rhinitis 07/02/2021   Senile purpura ([place]) 05/14/2020   Moderate persistent asthma with allergic rhinitis without complication 30/16/0109   Pre-diabetes 05/14/2020   Hyperglycemia 07/13/2017   Chronic constipation 12/15/2016   Primary osteoarthritis of left hip 11/05/2015   Scoliosis 11/05/2015   Chronic right-sided low back pain with right-sided sciatica 10/30/2014   Benign essential HTN 10/27/2014   Bradycardia 10/27/2014   Chronic insomnia 10/27/2014   Headache, temporal 10/27/2014   Hypercholesteremia 10/27/2014   Obesity (BMI 30-39.9) 10/27/2014   Changing sleep-work schedule, affecting sleep 10/27/2014   Vitamin D deficiency 10/27/2014   Arthritis of knee, degenerative 09/24/2009    Past Surgical History:  Procedure Laterality Date   ABDOMINAL HYSTERECTOMY     BREAST BIOPSY  Right 01/21/2013   stereo - benign   COLONOSCOPY WITH PROPOFOL N/A 06/30/2016   Procedure: COLONOSCOPY WITH PROPOFOL;  Surgeon: Jonathon Bellows, MD;  Location: [facility] ENDOSCOPY;  Service: Endoscopy;  Laterality: N/A;   COLONOSCOPY WITH PROPOFOL N/A 09/02/2021   Procedure: COLONOSCOPY WITH PROPOFOL;  Surgeon: Jonathon Bellows, MD;  Location: [facility] ENDOSCOPY;  Service: Gastroenterology;  Laterality: N/A;   KNEE SURGERY Left 2012   Dr. Rudene Christians   OOPHORECTOMY     TUBAL LIGATION     VAGINAL PROLAPSE REPAIR      Family  History  Problem Relation Age of Onset   Breast cancer Sister 92       in year 2013   Hyperlipidemia Mother    Diabetes Mother    Cancer Father        Leukemia   Hyperlipidemia Father    Lung cancer Brother    Breast cancer Sister    Heart failure Brother     Social History   Tobacco Use   Smoking status: Former    Packs/day: 0.25    Years: 1.00    Total pack years: 0.25    Types: Cigarettes    Start date: 08/13/1967    Quit date: 04/18/1977    Years since quitting: 45.0   Smokeless tobacco: Never   Tobacco comments:    smoking cessation materials not required  Substance Use Topics   Alcohol use: No    Alcohol/week: 0.0 standard drinks of alcohol     Current Outpatient Medications:    albuterol (VENTOLIN HFA) 108 (90 Base) MCG/ACT inhaler, Inhale 2 puffs into the lungs every 6 (six) hours as needed for wheezing or shortness of breath., Disp: 8 g, Rfl: 6   amLODipine (NORVASC) 10 MG tablet, Take 1 tablet (10 mg total) by mouth daily., Disp: 90 tablet, Rfl: 1   aspirin EC 81 MG tablet, Take 1 tablet (81 mg total) by mouth daily., Disp: 30 tablet, Rfl: 0   Azelastine-Fluticasone 137-50 MCG/ACT SUSP, Place 1 spray into both nostrils 2 (two) times daily., Disp: , Rfl:    Cholecalciferol (VITAMIN D) 2000 UNITS tablet, Take 1 tablet by mouth daily., Disp: , Rfl:    DUPIXENT 300 MG/2ML SOPN, INJECT 1 PEN SUBCUTANEOUSLY EVERY TWO WEEKS, Disp: 4 mL, Rfl: 0   Fluticasone-Umeclidin-Vilant (TRELEGY ELLIPTA) 200-62.5-25 MCG/ACT AEPB, Inhale 1 puff into the lungs daily., Disp: 28 each, Rfl: 11   HYDROcodone-acetaminophen (NORCO/VICODIN) 5-325 MG tablet, Take 1 tablet every 6 hours by oral route as needed for 5 days., Disp: , Rfl:    loratadine (CLARITIN) 10 MG tablet, Take 1 tablet (10 mg total) by mouth daily., Disp: 30 tablet, Rfl: 0   Magnesium 250 MG TABS, Take 1 tablet by mouth daily., Disp: , Rfl:    Multiple Vitamin (MULTIVITAMIN ADULT PO), Take by mouth., Disp: , Rfl:     rosuvastatin (CRESTOR) 10 MG tablet, Take 1 tablet (10 mg total) by mouth daily., Disp: 90 tablet, Rfl: 1   tolterodine (DETROL LA) 2 MG 24 hr capsule, Take 1 capsule (2 mg total) by mouth daily., Disp: 90 capsule, Rfl: 1   traMADol (ULTRAM) 50 MG tablet, Take 50 mg by mouth every 6 (six) hours as needed for severe pain (back pain)., Disp: , Rfl:    valsartan-hydrochlorothiazide (DIOVAN-HCT) 160-12.5 MG tablet, Take 1 tablet by mouth daily., Disp: 90 tablet, Rfl: 1   vitamin C (ASCORBIC ACID) 500 MG tablet, Take 1 tablet by mouth daily., Disp: , Rfl:  zolpidem (AMBIEN CR) 12.5 MG CR tablet, Take 1 tablet (12.5 mg total) by mouth at bedtime., Disp: 90 tablet, Rfl: 0  No Known Allergies  I personally reviewed active problem list, medication list, allergies, family history, social history, health maintenance with the patient/caregiver today.   ROS  Constitutional: Negative for fever or weight change.  Respiratory: Negative for cough and shortness of breath.   Cardiovascular: Negative for chest pain or palpitations.  Gastrointestinal: Negative for abdominal pain, no bowel changes.  Musculoskeletal: positive  for gait problem and joint swelling.  Skin: Negative for rash.  Neurological: Negative for dizziness or headache.  No other specific complaints in a complete review of systems (except as listed in HPI above).   Objective  Vitals:   04/29/22 0741  BP: 126/74  Pulse: 92  Resp: 16  SpO2: 99%  Weight: 184 lb (83.5 kg)  Height: '5\' 3"'$  (1.6 m)    Body mass index is 32.59 kg/m.  Physical Exam  Constitutional: Patient appears well-developed and well-nourished. Obese  No distress.  HEENT: head atraumatic, normocephalic, pupils equal and reactive to light,, neck supple Cardiovascular: Normal rate, regular rhythm and normal heart sounds.  No murmur heard. No BLE edema. Pulmonary/Chest: Effort normal and breath sounds normal. No respiratory distress. Abdominal: Soft.  There is no  tenderness. Psychiatric: Patient has a normal mood and affect. behavior is normal. Judgment and thought content normal.   Recent Results (from the past 2160 hour(s))  Nitric oxide     Status: None   Collection Time: 03/17/22 10:00 AM  Result Value Ref Range   Nitric Oxide 12     PHQ2/9:    04/29/2022    7:41 AM 04/25/2022    8:22 AM 12/21/2021    1:57 PM 09/30/2021    8:03 AM 08/20/2021    8:04 AM  Depression screen PHQ 2/9  Decreased Interest 0 0 0 0 0  Down, Depressed, Hopeless 0 0 0 0 0  PHQ - 2 Score 0 0 0 0 0  Altered sleeping 0 0 0  0  Tired, decreased energy 0 0 0  0  Change in appetite 0 0 0  0  Feeling bad or failure about yourself  0 0 0  0  Trouble concentrating 0 0 0  0  Moving slowly or fidgety/restless 0 0 0  0  Suicidal thoughts 0 0 0  0  PHQ-9 Score 0 0 0  0  Difficult doing work/chores  Not difficult at all       phq 9 is negative   Fall Risk:    04/29/2022    7:41 AM 04/25/2022    8:22 AM 12/21/2021    1:57 PM 09/30/2021    8:02 AM 08/20/2021    8:03 AM  Fall Risk   Falls in the past year? 0 0 0 0 0  Number falls in past yr: 0 0  0 0  Injury with Fall? 0 0  0 0  Risk for fall due to : No Fall Risks  Impaired balance/gait  No Fall Risks  Follow up Falls prevention discussed  Falls prevention discussed;Education provided;Falls evaluation completed Falls evaluation completed Falls prevention discussed     Functional Status Survey: Is the patient deaf or have difficulty hearing?: No Does the patient have difficulty seeing, even when wearing glasses/contacts?: No Does the patient have difficulty concentrating, remembering, or making decisions?: No Does the patient have difficulty walking or climbing stairs?: No Does the patient have difficulty  dressing or bathing?: No Does the patient have difficulty doing errands alone such as visiting a doctor's office or shopping?: No    Assessment & Plan  1. Senile purpura ([place])  Reassurance given   2. Benign  essential HTN  Bp at goal   3. Urge incontinence of urine  - tolterodine (DETROL LA) 2 MG 24 hr capsule; Take 1 capsule (2 mg total) by mouth daily.  Dispense: 90 capsule; Refill: 1  4. Chronic insomnia  - zolpidem (AMBIEN CR) 12.5 MG CR tablet; Take 1 tablet (12.5 mg total) by mouth at bedtime.  Dispense: 90 tablet; Refill: 0  5. Pre-diabetes  Reviewed low carb diet   6. Chronic bilateral low back pain without sciatica  - methocarbamol (ROBAXIN) 500 MG tablet; Take 1 tablet (500 mg total) by mouth every evening.  Dispense: 90 tablet; Refill: 0  7. Moderate persistent asthma with allergic rhinitis without complication  Keep follow up with Dr. Dagoberto Reef   8. Hypercholesteremia   9. Primary osteoarthritis of both knees   10. Perennial allergic rhinitis with seasonal variation  -Azelastine-Fluticasone 137-50 MCG/ACT SUSP; Place 1 spray into both nostrils 2 (two) times daily.  Dispense: 69 g; Refill: 1

## 2022-04-29 ENCOUNTER — Ambulatory Visit (INDEPENDENT_AMBULATORY_CARE_PROVIDER_SITE_OTHER): Payer: BC Managed Care – PPO | Admitting: Family Medicine

## 2022-04-29 ENCOUNTER — Encounter: Payer: Self-pay | Admitting: Family Medicine

## 2022-04-29 VITALS — BP 126/74 | HR 92 | Resp 16 | Ht 63.0 in | Wt 184.0 lb

## 2022-04-29 DIAGNOSIS — F5104 Psychophysiologic insomnia: Secondary | ICD-10-CM

## 2022-04-29 DIAGNOSIS — N3941 Urge incontinence: Secondary | ICD-10-CM

## 2022-04-29 DIAGNOSIS — R7303 Prediabetes: Secondary | ICD-10-CM

## 2022-04-29 DIAGNOSIS — D692 Other nonthrombocytopenic purpura: Secondary | ICD-10-CM

## 2022-04-29 DIAGNOSIS — I10 Essential (primary) hypertension: Secondary | ICD-10-CM | POA: Diagnosis not present

## 2022-04-29 DIAGNOSIS — E7800 Pure hypercholesterolemia, unspecified: Secondary | ICD-10-CM

## 2022-04-29 DIAGNOSIS — M545 Low back pain, unspecified: Secondary | ICD-10-CM

## 2022-04-29 DIAGNOSIS — J302 Other seasonal allergic rhinitis: Secondary | ICD-10-CM

## 2022-04-29 DIAGNOSIS — J4540 Moderate persistent asthma, uncomplicated: Secondary | ICD-10-CM

## 2022-04-29 DIAGNOSIS — M170 Bilateral primary osteoarthritis of knee: Secondary | ICD-10-CM

## 2022-04-29 DIAGNOSIS — J3089 Other allergic rhinitis: Secondary | ICD-10-CM

## 2022-04-29 DIAGNOSIS — G8929 Other chronic pain: Secondary | ICD-10-CM

## 2022-04-29 MED ORDER — TOLTERODINE TARTRATE ER 2 MG PO CP24: 2.0000 mg | ORAL_CAPSULE | Freq: Every day | ORAL | 1 refills | Status: DC

## 2022-04-29 MED ORDER — ZOLPIDEM TARTRATE ER 12.5 MG PO TBCR: 12.5000 mg | EXTENDED_RELEASE_TABLET | Freq: Every day | ORAL | 0 refills | Status: DC

## 2022-04-29 MED ORDER — AZELASTINE-FLUTICASONE 137-50 MCG/ACT NA SUSP: 1.0000 | Freq: Two times a day (BID) | NASAL | 1 refills | Status: DC

## 2022-04-29 MED ORDER — METHOCARBAMOL 500 MG PO TABS: 500.0000 mg | ORAL_TABLET | Freq: Every evening | ORAL | 0 refills | Status: DC

## 2022-05-06 DIAGNOSIS — M545 Low back pain, unspecified: Secondary | ICD-10-CM | POA: Diagnosis not present

## 2022-05-06 DIAGNOSIS — M461 Sacroiliitis, not elsewhere classified: Secondary | ICD-10-CM | POA: Diagnosis not present

## 2022-05-06 DIAGNOSIS — Z96641 Presence of right artificial hip joint: Secondary | ICD-10-CM | POA: Diagnosis not present

## 2022-05-27 DIAGNOSIS — R293 Abnormal posture: Secondary | ICD-10-CM | POA: Diagnosis not present

## 2022-05-27 DIAGNOSIS — M5459 Other low back pain: Secondary | ICD-10-CM | POA: Diagnosis not present

## 2022-06-14 DIAGNOSIS — M545 Low back pain, unspecified: Secondary | ICD-10-CM | POA: Diagnosis not present

## 2022-06-14 DIAGNOSIS — M461 Sacroiliitis, not elsewhere classified: Secondary | ICD-10-CM | POA: Diagnosis not present

## 2022-06-14 DIAGNOSIS — Z96641 Presence of right artificial hip joint: Secondary | ICD-10-CM | POA: Diagnosis not present

## 2022-06-15 DIAGNOSIS — M5459 Other low back pain: Secondary | ICD-10-CM | POA: Diagnosis not present

## 2022-06-15 DIAGNOSIS — R293 Abnormal posture: Secondary | ICD-10-CM | POA: Diagnosis not present

## 2022-06-21 ENCOUNTER — Other Ambulatory Visit: Payer: Self-pay | Admitting: Pulmonary Disease

## 2022-06-21 DIAGNOSIS — R293 Abnormal posture: Secondary | ICD-10-CM | POA: Diagnosis not present

## 2022-06-21 DIAGNOSIS — M5459 Other low back pain: Secondary | ICD-10-CM | POA: Diagnosis not present

## 2022-06-21 DIAGNOSIS — J4550 Severe persistent asthma, uncomplicated: Secondary | ICD-10-CM

## 2022-06-22 ENCOUNTER — Telehealth: Payer: Self-pay | Admitting: Pulmonary Disease

## 2022-06-22 DIAGNOSIS — J4550 Severe persistent asthma, uncomplicated: Secondary | ICD-10-CM

## 2022-06-22 MED ORDER — DUPIXENT 300 MG/2ML ~~LOC~~ SOAJ: SUBCUTANEOUS | 0 refills | Status: DC

## 2022-06-22 NOTE — Telephone Encounter (Signed)
Refill sent for [place] to  [facility]  Dose: 300 mg subcut every 2 weeks  Last OV: 03/17/2022 Provider:Dr. Patsey Berthold  Next OV: not yet scheduled but due  Knox Saliva, PharmD, MPH, BCPS Clinical Pharmacist (Rheumatology and Pulmonology)

## 2022-06-22 NOTE — Telephone Encounter (Signed)
Patient states needs RX for Dupixent injection. Patient phone number is [phone].

## 2022-06-28 DIAGNOSIS — R293 Abnormal posture: Secondary | ICD-10-CM | POA: Diagnosis not present

## 2022-06-28 DIAGNOSIS — M5459 Other low back pain: Secondary | ICD-10-CM | POA: Diagnosis not present

## 2022-07-14 ENCOUNTER — Ambulatory Visit: Payer: BC Managed Care – PPO | Attending: Pulmonary Disease

## 2022-07-14 DIAGNOSIS — J4550 Severe persistent asthma, uncomplicated: Secondary | ICD-10-CM

## 2022-07-14 LAB — PULMONARY FUNCTION TEST ARMC ONLY
DL/VA % pred: 102 %
DL/VA: 4.22 ml/min/mmHg/L
DLCO unc % pred: 85 %
DLCO unc: 16.36 ml/min/mmHg
FEF 25-75 Post: 2.12 L/sec
FEF 25-75 Pre: 2.43 L/sec
FEF2575-%Change-Post: -12 %
FEF2575-%Pred-Post: 124 %
FEF2575-%Pred-Pre: 142 %
FEV1-%Change-Post: 0 %
FEV1-%Pred-Post: 91 %
FEV1-%Pred-Pre: 91 %
FEV1-Post: 1.96 L
FEV1-Pre: 1.97 L
FEV1FVC-%Change-Post: -2 %
FEV1FVC-%Pred-Pre: 110 %
FEV6-%Change-Post: 1 %
FEV6-%Pred-Post: 89 %
FEV6-%Pred-Pre: 87 %
FEV6-Post: 2.42 L
FEV6-Pre: 2.38 L
FEV6FVC-%Change-Post: 0 %
FEV6FVC-%Pred-Post: 104 %
FEV6FVC-%Pred-Pre: 105 %
FVC-%Change-Post: 1 %
FVC-%Pred-Post: 84 %
FVC-%Pred-Pre: 83 %
FVC-Post: 2.42 L
FVC-Pre: 2.38 L
Post FEV1/FVC ratio: 81 %
Post FEV6/FVC ratio: 100 %
Pre FEV1/FVC ratio: 83 %
Pre FEV6/FVC Ratio: 100 %
RV % pred: 50 %
RV: 1.15 L
TLC % pred: 70 %
TLC: 3.57 L

## 2022-07-14 MED ORDER — ALBUTEROL SULFATE (2.5 MG/3ML) 0.083% IN NEBU
2.5000 mg | INHALATION_SOLUTION | Freq: Once | RESPIRATORY_TRACT | Status: AC
  Administered 2022-07-14: 2.5 mg via RESPIRATORY_TRACT
  Filled 2022-07-14: qty 3

## 2022-07-21 ENCOUNTER — Encounter: Payer: Self-pay | Admitting: Pulmonary Disease

## 2022-07-21 ENCOUNTER — Ambulatory Visit (INDEPENDENT_AMBULATORY_CARE_PROVIDER_SITE_OTHER): Payer: BC Managed Care – PPO | Admitting: Pulmonary Disease

## 2022-07-21 VITALS — BP 128/82 | HR 64 | Temp 97.1°F | Ht 63.0 in | Wt 189.2 lb

## 2022-07-21 DIAGNOSIS — J329 Chronic sinusitis, unspecified: Secondary | ICD-10-CM

## 2022-07-21 DIAGNOSIS — R053 Chronic cough: Secondary | ICD-10-CM

## 2022-07-21 DIAGNOSIS — R768 Other specified abnormal immunological findings in serum: Secondary | ICD-10-CM | POA: Diagnosis not present

## 2022-07-21 DIAGNOSIS — J4550 Severe persistent asthma, uncomplicated: Secondary | ICD-10-CM

## 2022-07-21 NOTE — Patient Instructions (Signed)
We will see you in follow-up in 6 months time.  Your lungs sounded really good today.  You are doing well.  If you have any problems between now and the time for follow-up please let us know.

## 2022-07-21 NOTE — Progress Notes (Signed)
Subjective:    Patient ID: Hayley Howe, female    DOB: 1945/05/21, 76 y.o.   MRN: [id number] Patient Care Team: Steele Sizer, MD as PCP - General (Family Medicine) Grant Fontana, [place] as Consulting Physician (Chiropractic Medicine) Beverly Gust, MD as Consulting Physician (Otolaryngology) Jonathon Bellows, MD as Consulting Physician (Gastroenterology) Tyler Pita, MD as Consulting Physician (Pulmonary Disease) Lovell Sheehan, MD as Consulting Physician (Orthopedic Surgery)  Chief Complaint  Patient presents with   Follow-up    No SOB or wheezing. Occasional cough.    HPI Hayley Howe is a 77 year old very remote former smoker with minimal tobacco exposure, who presents for follow-up on the issue of severe persistent asthma,allergic phenotype,and chronic rhinosinusitis.  This is a scheduled visit.  She was last seen on 17 March 2022 by me.  At that time she was noted to be well-controlled on Dupixent Trelegy and as needed albuterol. She started Dupixent on March 2023, she has been tolerating it well.  Since starting she has noted steady improvement in her asthma symptoms and control.  She has not had any exacerbations, her cough has resolved.  She has not had any issues with nasal congestion since she has been on Dupixent.  She she is very compliant with her nasal hygiene and with her Trelegy and as needed albuterol.  She has not required as needed albuterol since her last visit.  She has not had any fevers, chills or sweats.  No sputum production, no hemoptysis. No orthopnea or paroxysmal nocturnal dyspnea.  No lower extremity edema, no calf tenderness.  Pulmonary function testing was performed on 14 July 2022 overall unchanged from prior.  Overall she feels well and looks well.    TEST/EVENTS : PFTs on February 28, 2019 showed normal lung function with an FEV1 at 87%, ratio 72, FVC 94%, no significant bronchodilator response CT chest November 2020 mild bronchial wall thickening  with bronchial secretions and associated scarring and linear atelectasis in the right middle lobe, left upper lobe and lingula.  Otherwise clear with no evidence of interstitial lung disease, findings of healed granulomatous disease in the right lower lobe CT sinus April 08, 2019 extensive acute on chronic sinusitis, air-fluid levels in the maxillary sinuses, minimal improvement and left frontal sinus and scattered ethmoid air cells Allergen panel, January 2023 absolute eosinophil count 500, IgE 545, allergen panel negative except for Cladosporium Herbarum IgE (common mold)  07/14/2022 PFTs: FEV1 1.97 L or 91% predicted, FVC 2.38 L or 83% predicted, FEV1/FVC 83%, no significant bronchodilator response. Diffusion capacity normal.  Compared to PFTs from 2020, no significant change.   Review of Systems A 10 point review of systems was performed and it is as noted above otherwise negative.  Patient Active Problem List   Diagnosis Date Noted   Severe persistent asthma without complication 0000000   History of total hip arthroplasty 01/05/2022   Primary osteoarthritis of right hip 12/21/2021   Anemia 12/21/2021   HTN (hypertension) 08/06/2021   HLD (hyperlipidemia) 08/06/2021   Kidney lesion 08/06/2021   Chronic rhinitis 07/02/2021   Senile purpura 05/14/2020   Moderate persistent asthma with allergic rhinitis without complication 123XX123   Pre-diabetes 05/14/2020   Hyperglycemia 07/13/2017   Chronic constipation 12/15/2016   Primary osteoarthritis of left hip 11/05/2015   Scoliosis 11/05/2015   Chronic right-sided low back pain with right-sided sciatica 10/30/2014   Benign essential HTN 10/27/2014   Bradycardia 10/27/2014   Chronic insomnia 10/27/2014   Headache, temporal 10/27/2014  Hypercholesteremia 10/27/2014   Obesity (BMI 30-39.9) 10/27/2014   Changing sleep-work schedule, affecting sleep 10/27/2014   Vitamin D deficiency 10/27/2014   Arthritis of knee, degenerative  09/24/2009   Social History   Tobacco Use   Smoking status: Former    Packs/day: 0.25    Years: 1.00    Additional pack years: 0.00    Total pack years: 0.25    Types: Cigarettes    Start date: 08/13/1967    Quit date: 04/18/1977    Years since quitting: 45.2   Smokeless tobacco: Never   Tobacco comments:    smoking cessation materials not required  Substance Use Topics   Alcohol use: No    Alcohol/week: 0.0 standard drinks of alcohol   No Known Allergies  Current Meds  Medication Sig   albuterol (VENTOLIN HFA) 108 (90 Base) MCG/ACT inhaler Inhale 2 puffs into the lungs every 6 (six) hours as needed for wheezing or shortness of breath.   amLODipine (NORVASC) 10 MG tablet Take 1 tablet (10 mg total) by mouth daily.   aspirin EC 81 MG tablet Take 1 tablet (81 mg total) by mouth daily.   Azelastine-Fluticasone 137-50 MCG/ACT SUSP Place 1 spray into both nostrils 2 (two) times daily.   celecoxib (CELEBREX) 200 MG capsule Take 1 capsule by mouth daily as needed.   Cholecalciferol (VITAMIN D) 2000 UNITS tablet Take 1 tablet by mouth daily.   Dupilumab (DUPIXENT) 300 MG/2ML SOPN INJECT 1 PEN SUBCUTANEOUSLY EVERY TWO WEEKS   Fluticasone-Umeclidin-Vilant (TRELEGY ELLIPTA) 200-62.5-25 MCG/ACT AEPB Inhale 1 puff into the lungs daily.   HYDROcodone-acetaminophen (NORCO/VICODIN) 5-325 MG tablet Take 1 tablet by mouth as needed.   loratadine (CLARITIN) 10 MG tablet Take 1 tablet (10 mg total) by mouth daily.   Magnesium 250 MG TABS Take 1 tablet by mouth daily.   methocarbamol (ROBAXIN) 500 MG tablet Take 1 tablet (500 mg total) by mouth every evening.   Multiple Vitamin (MULTIVITAMIN ADULT PO) Take by mouth.   rosuvastatin (CRESTOR) 10 MG tablet Take 1 tablet (10 mg total) by mouth daily.   tolterodine (DETROL LA) 2 MG 24 hr capsule Take 1 capsule (2 mg total) by mouth daily.   traMADol (ULTRAM) 50 MG tablet Take 50 mg by mouth every 6 (six) hours as needed for severe pain (back pain).    valsartan-hydrochlorothiazide (DIOVAN-HCT) 160-12.5 MG tablet Take 1 tablet by mouth daily.   vitamin C (ASCORBIC ACID) 500 MG tablet Take 1 tablet by mouth daily.   zolpidem (AMBIEN CR) 12.5 MG CR tablet Take 1 tablet (12.5 mg total) by mouth at bedtime.   Immunization History  Administered Date(s) Administered   Fluad Quad(high Dose 65+) 12/13/2018, 01/09/2020, 12/24/2020   Influenza, High Dose Seasonal PF 01/20/2016, 12/15/2016, 01/19/2018, 12/17/2021   Influenza, Seasonal, Injecte, Preservative Fre 02/18/2010, 02/11/2011, 03/29/2012   Influenza,inj,Quad PF,6+ Mos 01/03/2013, 01/10/2014, 03/05/2015   Influenza-Unspecified 12/17/2013   Moderna SARS-COV2 Booster Vaccination 02/17/2021   Moderna Sars-Covid-2 Vaccination 05/26/2019, 06/26/2019, 02/20/2020, 10/15/2020   Pneumococcal Conjugate-13 06/19/2014   Pneumococcal Polysaccharide-23 02/18/2010, 07/02/2015   Rsv, Bivalent, Protein Subunit Rsvpref,pf Evans Lance) 03/17/2022   Td 08/12/2016   Tdap 09/14/2007   Zoster Recombinat (Shingrix) 06/03/2021, 08/05/2021   Zoster, Live 08/20/2015       Objective:   Physical Exam BP 128/82 (BP Location: Left Arm, Cuff Size: Normal)   Pulse 64   Temp (!) 97.1 F (36.2 C)   Ht 5\' 3"  (1.6 m)   Wt 189 lb 3.2 oz (85.8 kg)  SpO2 97%   BMI 33.52 kg/m   SpO2: 97 % O2 Device: None (Room air)  GENERAL: Well-developed, overweight woman in no acute distress, well groomed.  Fully ambulatory.  No conversational dyspnea.  Speech clear. HEAD: Normocephalic, atraumatic.  Nasal quality to speech. EYES: Pupils equal, round, reactive to light.  No scleral icterus.  MOUTH: Nose/mouth/throat not examined due to masking requirements for COVID 19 (patient still wears a mask). NECK: Supple. No thyromegaly. Trachea midline. No JVD.  No adenopathy. PULMONARY: Good air entry bilaterally.  No adventitious sounds. CARDIOVASCULAR: S1 and S2. Regular rate and rhythm.  Grade 1/6 systolic ejection murmur left sternal  border. No gallops or rubs.   GASTROINTESTINAL: Benign. MUSCULOSKELETAL: No joint deformity, no clubbing, no edema.  NEUROLOGIC: No focal deficit, no gait disturbance, speech is fluent.   SKIN: Intact,warm,dry.  No overt rashes noted. PSYCH: Mood and behavior appropriate       Assessment & Plan:     ICD-10-CM   1. Severe persistent asthma without complication  123XX123    Well compensated on current regimen Continue Trelegy, as needed albuterol and Dupixent    2. Chronic rhinosinusitis  J32.9    Marked improvement on Dupixent Continue Dupixent    3. Chronic cough - resolved  R05.3    No recurrence on current regimen    4. Elevated IgE level  R76.8    Related to her allergic phenotype asthma     Patient is doing well currently.  She will see [place] in follow-up in 6 months time she is to contact us prior to that time should any new difficulties arise.  Renold Don, MD Advanced Bronchoscopy PCCM [facility]    *This note was dictated using voice recognition software/Dragon.  Despite best efforts to proofread, errors can occur which can change the meaning. Any transcriptional errors that result from this process are unintentional and may not be fully corrected at the time of dictation.

## 2022-07-27 DIAGNOSIS — M533 Sacrococcygeal disorders, not elsewhere classified: Secondary | ICD-10-CM | POA: Diagnosis not present

## 2022-08-24 DIAGNOSIS — M533 Sacrococcygeal disorders, not elsewhere classified: Secondary | ICD-10-CM | POA: Diagnosis not present

## 2022-08-29 NOTE — Progress Notes (Unsigned)
Name: Hayley Howe   MRN: [id number]    DOB: [birth year]   Date:08/30/2022       Progress Note  Subjective  Chief Complaint  Follow Up  HPI  Urge incontinence: she stopped taking medications on a regular basis and has noticed increase in nocturia again, she will resume taking medication today.Marland Kitchen History of bladder tack many years ago   Chronic sinusitis/AR/[place]ma : she was seen by Dr. Marcos Eke 21  - pulmonologist , had a normal spirometry, CT chest  showed some scarring, CT sinus showed chronic sinusitis back in 2020  She is now on Dupixent , singulair at night and also She needs refill of nasal spray. She states symptoms are controlled   Chronic Low Back: she has a long history of low back pain, not secondary to trauma, DDD lumbar spine and also scoliosis. last rx of Tramadol that I gave to her was 07/2021, since than getting mediation from Dr. Odis Luster due to hip surgery but last fill was 20 tablets in January 2024 She had a trigger point injection on right lower back last week and seems to have improved the pain, denies radiculitis at this time. Discussed pain contract, we will give her 90 tablets of Tramadol to last until follow up. Cannot get pain medications from another provider. She is now working 4 days a week with a break after two days.   Sebaceous cyst: she has two cysts that are growing and bothering her, on on mid upper back and the other on right forearm near the elbow. 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 She sleeps from around 2 pm-8:30pm for about 5-7 hours when she sleeps during the day She takes Ambien , denies side effects of medication, she is aware of FDA guidelines however unable to sleep with lower dose of Ambien. She only takes medications the nights that she works.   OA:  s/p left knee replaced, very seldom has pain or discomfort since  surgery was done by Dr. Rosita Kea in 2012 , she had  right  hip replacement by Dr. Odis Luster 12/2021, she has been doing well.    Hyperglycemia: last hgbA1C was 6.1 % denies polyphagia, polydipsia or polyuria.  Continue low carb diet We will recheck labs today .  HTN: taking valsartan hctz, and also Norvasc, she states no longer having dizziness, no chest pain or palpitation .BP is within normal limits today    Dyslipidemia: last LDL stable on Crestor , no myalgia. Last LDL was 93, we will recheck labs today   Senile Purpura: usually on arms , stable.   Tingling on fingers on both hands: usually thumb, index finger and sometimes middle finger on both hands, denies neck pain, going on for months. No aggravating or alleviating factors. She goes to [facility], discussed nerve conduction studies Explained it may be secondary to neck compression.   Patient Active Problem List   Diagnosis Date Noted   Severe persistent asthma without complication 07/14/2022   History of total hip arthroplasty 01/05/2022   Primary osteoarthritis of right hip 12/21/2021   Anemia 12/21/2021   HTN (hypertension) 08/06/2021   HLD (hyperlipidemia) 08/06/2021   Kidney lesion 08/06/2021   Chronic rhinitis 07/02/2021   Senile purpura (HCC) 05/14/2020   Moderate persistent asthma with allergic rhinitis without complication 05/14/2020   Pre-diabetes 05/14/2020   Hyperglycemia 07/13/2017   Chronic constipation 12/15/2016   Primary osteoarthritis of  left hip 11/05/2015   Scoliosis 11/05/2015   Chronic right-sided low back pain with right-sided sciatica 10/30/2014   Benign essential HTN 10/27/2014   Bradycardia 10/27/2014   Chronic insomnia 10/27/2014   Headache, temporal 10/27/2014   Hypercholesteremia 10/27/2014   Obesity (BMI 30-39.9) 10/27/2014   Changing sleep-work schedule, affecting sleep 10/27/2014   Vitamin D deficiency 10/27/2014   Arthritis of knee, degenerative 09/24/2009    Past Surgical History:  Procedure Laterality Date   ABDOMINAL HYSTERECTOMY      BREAST BIOPSY Right 01/21/2013   stereo - benign   COLONOSCOPY WITH PROPOFOL N/A 06/30/2016   Procedure: COLONOSCOPY WITH PROPOFOL;  Surgeon: Wyline Mood, MD;  Location: [facility] ENDOSCOPY;  Service: Endoscopy;  Laterality: N/A;   COLONOSCOPY WITH PROPOFOL N/A 09/02/2021   Procedure: COLONOSCOPY WITH PROPOFOL;  Surgeon: Wyline Mood, MD;  Location: [facility] ENDOSCOPY;  Service: Gastroenterology;  Laterality: N/A;   KNEE SURGERY Left 2012   Dr. Rosita Kea   OOPHORECTOMY     TUBAL LIGATION     VAGINAL PROLAPSE REPAIR      Family History  Problem Relation Age of Onset   Breast cancer Sister 69       in year 2013   Hyperlipidemia Mother    Diabetes Mother    Cancer Father        Leukemia   Hyperlipidemia Father    Lung cancer Brother    Breast cancer Sister    Heart failure Brother     Social History   Tobacco Use   Smoking status: Former    Packs/day: 0.25    Years: 1.00    Additional pack years: 0.00    Total pack years: 0.25    Types: Cigarettes    Start date: 08/13/1967    Quit date: 04/18/1977    Years since quitting: 45.3   Smokeless tobacco: Never   Tobacco comments:    smoking cessation materials not required  Substance Use Topics   Alcohol use: No    Alcohol/week: 0.0 standard drinks of alcohol     Current Outpatient Medications:    albuterol (VENTOLIN HFA) 108 (90 Base) MCG/ACT inhaler, Inhale 2 puffs into the lungs every 6 (six) hours as needed for wheezing or shortness of breath., Disp: 8 g, Rfl: 6   amLODipine (NORVASC) 10 MG tablet, Take 1 tablet (10 mg total) by mouth daily., Disp: 90 tablet, Rfl: 1   aspirin EC 81 MG tablet, Take 1 tablet (81 mg total) by mouth daily., Disp: 30 tablet, Rfl: 0   Azelastine-Fluticasone 137-50 MCG/ACT SUSP, Place 1 spray into both nostrils 2 (two) times daily., Disp: 69 g, Rfl: 1   celecoxib (CELEBREX) 200 MG capsule, Take 1 capsule by mouth daily as needed., Disp: , Rfl:    Cholecalciferol (VITAMIN D) 2000 UNITS tablet, Take 1 tablet by  mouth daily., Disp: , Rfl:    Dupilumab (DUPIXENT) 300 MG/2ML SOPN, INJECT 1 PEN SUBCUTANEOUSLY EVERY TWO WEEKS, Disp: 12 mL, Rfl: 0   Fluticasone-Umeclidin-Vilant (TRELEGY ELLIPTA) 200-62.5-25 MCG/ACT AEPB, Inhale 1 puff into the lungs daily., Disp: 28 each, Rfl: 11   HYDROcodone-acetaminophen (NORCO/VICODIN) 5-325 MG tablet, Take 1 tablet by mouth as needed., Disp: , Rfl:    loratadine (CLARITIN) 10 MG tablet, Take 1 tablet (10 mg total) by mouth daily., Disp: 30 tablet, Rfl: 0   Magnesium 250 MG TABS, Take 1 tablet by mouth daily., Disp: , Rfl:    methocarbamol (ROBAXIN) 500 MG tablet, Take 1 tablet (500 mg total) by mouth  every evening., Disp: 90 tablet, Rfl: 0   Multiple Vitamin (MULTIVITAMIN ADULT PO), Take by mouth., Disp: , Rfl:    rosuvastatin (CRESTOR) 10 MG tablet, Take 1 tablet (10 mg total) by mouth daily., Disp: 90 tablet, Rfl: 1   tolterodine (DETROL LA) 2 MG 24 hr capsule, Take 1 capsule (2 mg total) by mouth daily., Disp: 90 capsule, Rfl: 1   traMADol (ULTRAM) 50 MG tablet, Take 50 mg by mouth every 6 (six) hours as needed for severe pain (back pain)., Disp: , Rfl:    valsartan-hydrochlorothiazide (DIOVAN-HCT) 160-12.5 MG tablet, Take 1 tablet by mouth daily., Disp: 90 tablet, Rfl: 1   vitamin C (ASCORBIC ACID) 500 MG tablet, Take 1 tablet by mouth daily., Disp: , Rfl:    zolpidem (AMBIEN CR) 12.5 MG CR tablet, Take 1 tablet (12.5 mg total) by mouth at bedtime., Disp: 90 tablet, Rfl: 0  No Known Allergies  I personally reviewed active problem list, medication list, allergies, family history, social history, health maintenance with the patient/caregiver today.   ROS  Ten systems reviewed and is negative except as mentioned in HPI   Objective  Vitals:   08/30/22 0738  BP: 138/72  Pulse: 83  Resp: 16  SpO2: 98%  Weight: 187 lb (84.8 kg)  Height: 5\' 4"  (1.626 m)    Body mass index is 32.1 kg/m.  Physical Exam  Constitutional: Patient appears well-developed and  well-nourished. Obese  No distress.  HEENT: head atraumatic, normocephalic, pupils equal and reactive to light, neck supple Cardiovascular: Normal rate, regular rhythm and normal heart sounds.  No murmur heard. No BLE edema. Pulmonary/Chest: Effort normal and breath sounds normal. No respiratory distress. Abdominal: Soft.  There is no tenderness. Psychiatric: Patient has a normal mood and affect. behavior is normal. Judgment and thought content normal.   Recent Results (from the past 2160 hour(s))  Pulmonary Function Test [facility] Only     Status: None   Collection Time: 07/14/22  2:52 PM  Result Value Ref Range   FVC-Pre 2.38 L   FVC-%Pred-Pre 83 %   FVC-Post 2.42 L   FVC-%Pred-Post 84 %   FVC-%Change-Post 1 %   FEV1-Pre 1.97 L   FEV1-%Pred-Pre 91 %   FEV1-Post 1.96 L   FEV1-%Pred-Post 91 %   FEV1-%Change-Post 0 %   FEV6-Pre 2.38 L   FEV6-%Pred-Pre 87 %   FEV6-Post 2.42 L   FEV6-%Pred-Post 89 %   FEV6-%Change-Post 1 %   Pre FEV1/FVC ratio 83 %   FEV1FVC-%Pred-Pre 110 %   Post FEV1/FVC ratio 81 %   FEV1FVC-%Change-Post -2 %   Pre FEV6/FVC Ratio 100 %   FEV6FVC-%Pred-Pre 105 %   Post FEV6/FVC ratio 100 %   FEV6FVC-%Pred-Post 104 %   FEV6FVC-%Change-Post 0 %   FEF 25-75 Pre 2.43 L/sec   FEF2575-%Pred-Pre 142 %   FEF 25-75 Post 2.12 L/sec   FEF2575-%Pred-Post 124 %   FEF2575-%Change-Post -12 %   RV 1.15 L   RV % pred 50 %   TLC 3.57 L   TLC % pred 70 %   DLCO unc 16.36 ml/min/mmHg   DLCO unc % pred 85 %   DL/VA 1.61 ml/min/mmHg/L   DL/VA % pred 096 %    EAV4/0:    08/30/2022    7:38 AM 04/29/2022    7:41 AM 04/25/2022    8:22 AM 12/21/2021    1:57 PM 09/30/2021    8:03 AM  Depression screen PHQ 2/9  Decreased Interest 0 0  0 0 0  Down, Depressed, Hopeless 0 0 0 0 0  PHQ - 2 Score 0 0 0 0 0  Altered sleeping 0 0 0 0   Tired, decreased energy 0 0 0 0   Change in appetite 0 0 0 0   Feeling bad or failure about yourself  0 0 0 0   Trouble concentrating 0 0 0 0   Moving  slowly or fidgety/restless 0 0 0 0   Suicidal thoughts 0 0 0 0   PHQ-9 Score 0 0 0 0   Difficult doing work/chores   Not difficult at all      phq 9 is negative   Fall Risk:    08/30/2022    7:38 AM 04/29/2022    7:41 AM 04/25/2022    8:22 AM 12/21/2021    1:57 PM 09/30/2021    8:02 AM  Fall Risk   Falls in the past year? 0 0 0 0 0  Number falls in past yr: 0 0 0  0  Injury with Fall? 0 0 0  0  Risk for fall due to : No Fall Risks No Fall Risks  Impaired balance/gait   Follow up Falls prevention discussed Falls prevention discussed  Falls prevention discussed;Education provided;Falls evaluation completed Falls evaluation completed      Functional Status Survey: Is the patient deaf or have difficulty hearing?: No Does the patient have difficulty seeing, even when wearing glasses/contacts?: No Does the patient have difficulty concentrating, remembering, or making decisions?: No Does the patient have difficulty walking or climbing stairs?: Yes Does the patient have difficulty dressing or bathing?: No Does the patient have difficulty doing errands alone such as visiting a doctor's office or shopping?: No    Assessment & Plan  1. Sebaceous cyst  - Ambulatory referral to General Surgery  2. Senile purpura (HCC)  Reassurance given   3. Moderate persistent asthma with allergic rhinitis without complication  Doing well on medication  4. Chronic insomnia  She still has Ambien at home   5. Benign essential HTN  - COMPLETE METABOLIC PANEL WITH GFR - CBC with Differential/Platelet - amLODipine (NORVASC) 10 MG tablet; Take 1 tablet (10 mg total) by mouth daily.  Dispense: 90 tablet; Refill: 1 - valsartan-hydrochlorothiazide (DIOVAN-HCT) 160-12.5 MG tablet; Take 1 tablet by mouth daily.  Dispense: 90 tablet; Refill: 1  6. Pre-diabetes  - Hemoglobin A1c  7. Chronic bilateral low back pain without sciatica  - methocarbamol (ROBAXIN) 500 MG tablet; Take 1 tablet (500 mg  total) by mouth every evening.  Dispense: 90 tablet; Refill: 0 - traMADol (ULTRAM) 50 MG tablet; Take 1 tablet (50 mg total) by mouth every 12 (twelve) hours as needed for severe pain (back pain).  Dispense: 90 tablet; Refill: 0  8. Paresthesia of both hands  - B12 and Folate Panel  9. Hypercholesteremia  - Lipid panel - rosuvastatin (CRESTOR) 10 MG tablet; Take 1 tablet (10 mg total) by mouth daily.  Dispense: 90 tablet; Refill: 1

## 2022-08-30 ENCOUNTER — Encounter: Payer: Self-pay | Admitting: Family Medicine

## 2022-08-30 ENCOUNTER — Ambulatory Visit (INDEPENDENT_AMBULATORY_CARE_PROVIDER_SITE_OTHER): Payer: BC Managed Care – PPO | Admitting: Family Medicine

## 2022-08-30 VITALS — BP 138/72 | HR 83 | Resp 16 | Ht 64.0 in | Wt 187.0 lb

## 2022-08-30 DIAGNOSIS — I10 Essential (primary) hypertension: Secondary | ICD-10-CM

## 2022-08-30 DIAGNOSIS — F5104 Psychophysiologic insomnia: Secondary | ICD-10-CM

## 2022-08-30 DIAGNOSIS — G8929 Other chronic pain: Secondary | ICD-10-CM

## 2022-08-30 DIAGNOSIS — D692 Other nonthrombocytopenic purpura: Secondary | ICD-10-CM

## 2022-08-30 DIAGNOSIS — M545 Low back pain, unspecified: Secondary | ICD-10-CM

## 2022-08-30 DIAGNOSIS — R202 Paresthesia of skin: Secondary | ICD-10-CM | POA: Diagnosis not present

## 2022-08-30 DIAGNOSIS — J4540 Moderate persistent asthma, uncomplicated: Secondary | ICD-10-CM

## 2022-08-30 DIAGNOSIS — R7303 Prediabetes: Secondary | ICD-10-CM

## 2022-08-30 DIAGNOSIS — E7800 Pure hypercholesterolemia, unspecified: Secondary | ICD-10-CM

## 2022-08-30 DIAGNOSIS — L723 Sebaceous cyst: Secondary | ICD-10-CM | POA: Diagnosis not present

## 2022-08-30 LAB — CBC WITH DIFFERENTIAL/PLATELET
Absolute Monocytes: 378 cells/uL (ref 200–950)
Basophils Absolute: 63 cells/uL (ref 0–200)
Eosinophils Relative: 0.9 %
HCT: 34.2 % — ABNORMAL LOW (ref 35.0–45.0)
Hemoglobin: 11 g/dL — ABNORMAL LOW (ref 11.7–15.5)
MCV: 83.6 fL (ref 80.0–100.0)
MPV: 10.6 fL (ref 7.5–12.5)
Platelets: 324 10*3/uL (ref 140–400)
RBC: 4.09 10*6/uL (ref 3.80–5.10)
Total Lymphocyte: 35.1 %
WBC: 7 10*3/uL (ref 3.8–10.8)

## 2022-08-30 MED ORDER — TRAMADOL HCL 50 MG PO TABS
50.0000 mg | ORAL_TABLET | Freq: Two times a day (BID) | ORAL | 0 refills | Status: DC | PRN
Start: 2022-08-30 — End: 2022-12-30

## 2022-08-30 MED ORDER — VALSARTAN-HYDROCHLOROTHIAZIDE 160-12.5 MG PO TABS: 1.0000 | ORAL_TABLET | Freq: Every day | ORAL | 1 refills | Status: DC

## 2022-08-30 MED ORDER — METHOCARBAMOL 500 MG PO TABS: 500.0000 mg | ORAL_TABLET | Freq: Every evening | ORAL | 0 refills | Status: DC

## 2022-08-30 MED ORDER — AMLODIPINE BESYLATE 10 MG PO TABS: 10.0000 mg | ORAL_TABLET | Freq: Every day | ORAL | 1 refills | Status: DC

## 2022-08-30 MED ORDER — ROSUVASTATIN CALCIUM 10 MG PO TABS: 10.0000 mg | ORAL_TABLET | Freq: Every day | ORAL | 1 refills | Status: DC

## 2022-08-31 ENCOUNTER — Other Ambulatory Visit: Payer: Self-pay | Admitting: Family Medicine

## 2022-08-31 DIAGNOSIS — R944 Abnormal results of kidney function studies: Secondary | ICD-10-CM

## 2022-08-31 LAB — COMPLETE METABOLIC PANEL WITH GFR
AG Ratio: 1.4 (calc) (ref 1.0–2.5)
ALT: 26 U/L (ref 6–29)
AST: 28 U/L (ref 10–35)
Albumin: 4.4 g/dL (ref 3.6–5.1)
Alkaline phosphatase (APISO): 57 U/L (ref 37–153)
BUN/Creatinine Ratio: 23 (calc) — ABNORMAL HIGH (ref 6–22)
BUN: 27 mg/dL — ABNORMAL HIGH (ref 7–25)
CO2: 26 mmol/L (ref 20–32)
Calcium: 9.6 mg/dL (ref 8.6–10.4)
Chloride: 102 mmol/L (ref 98–110)
Creat: 1.18 mg/dL — ABNORMAL HIGH (ref 0.60–1.00)
Globulin: 3.1 g/dL (calc) (ref 1.9–3.7)
Glucose, Bld: 83 mg/dL (ref 65–99)
Potassium: 4.5 mmol/L (ref 3.5–5.3)
Sodium: 140 mmol/L (ref 135–146)
Total Bilirubin: 0.2 mg/dL (ref 0.2–1.2)
Total Protein: 7.5 g/dL (ref 6.1–8.1)
eGFR: 48 mL/min/{1.73_m2} — ABNORMAL LOW (ref >=60)

## 2022-08-31 LAB — HEMOGLOBIN A1C
Hgb A1c MFr Bld: 6.3 % of total Hgb — ABNORMAL HIGH (ref <5.7)
Mean Plasma Glucose: 134 mg/dL
eAG (mmol/L): 7.4 mmol/L

## 2022-08-31 LAB — B12 AND FOLATE PANEL
Folate: >24 ng/mL
Vitamin B-12: 501 pg/mL (ref 200–1100)

## 2022-08-31 LAB — LIPID PANEL
Cholesterol: 175 mg/dL (ref <200)
HDL: 91 mg/dL (ref >=50)
LDL Cholesterol (Calc): 71 mg/dL (calc)
Non-HDL Cholesterol (Calc): 84 mg/dL (calc) (ref <130)
Total CHOL/HDL Ratio: 1.9 (calc) (ref <5.0)
Triglycerides: 51 mg/dL (ref <150)

## 2022-08-31 LAB — CBC WITH DIFFERENTIAL/PLATELET
Basophils Relative: 0.9 %
Eosinophils Absolute: 63 cells/uL (ref 15–500)
Lymphs Abs: 2457 cells/uL (ref 850–3900)
MCH: 26.9 pg — ABNORMAL LOW (ref 27.0–33.0)
MCHC: 32.2 g/dL (ref 32.0–36.0)
Monocytes Relative: 5.4 %
Neutro Abs: 4039 cells/uL (ref 1500–7800)
Neutrophils Relative %: 57.7 %
RDW: 13.9 % (ref 11.0–15.0)

## 2022-09-05 ENCOUNTER — Other Ambulatory Visit: Payer: Self-pay | Admitting: Pulmonary Disease

## 2022-09-05 DIAGNOSIS — J4550 Severe persistent asthma, uncomplicated: Secondary | ICD-10-CM

## 2022-09-08 ENCOUNTER — Ambulatory Visit (INDEPENDENT_AMBULATORY_CARE_PROVIDER_SITE_OTHER): Payer: BC Managed Care – PPO | Admitting: Surgery

## 2022-09-08 ENCOUNTER — Encounter: Payer: Self-pay | Admitting: Surgery

## 2022-09-08 VITALS — BP 168/75 | HR 66 | Temp 98.4°F | Ht 64.0 in | Wt 184.8 lb

## 2022-09-08 DIAGNOSIS — L723 Sebaceous cyst: Secondary | ICD-10-CM | POA: Diagnosis not present

## 2022-09-08 NOTE — Patient Instructions (Signed)
Please STOP your Aspirin today until after the procedure.  If you have any concerns or questions, please feel free to call our office. See follow up appointment.   Epidermoid Cyst Removal Epidermoid cyst removal is a procedure to remove a fluid-filled sac that forms under your skin (epidermoid cyst). This type of cyst is filled with a thick, oily substance (keratin) that is secreted by your skin glands. Epidermoid cysts may also be called epidermal cysts, or keratin cysts. Normally, the skin secretes this pasty material through a gland or a hair follicle. However, when a skin gland or hair follicle becomes blocked, an epidermoid cyst can form. You may need this procedure if you have an epidermal cyst that becomes large, uncomfortable, or inflamed. Tell a health care provider about: Any allergies you have. All medicines you are taking, including vitamins, herbs, eye drops, creams, and over-the-counter medicines. Any problems you or family members have had with anesthetic medicines. Any blood disorders you have. Any surgeries you have had. Any medical conditions you have now or have had. Whether you are pregnant or may be pregnant. What are the risks? Generally, this is a safe procedure. However, problems may occur, including: Recurrence of the cyst. Bleeding. Infection. Scarring. What happens before the procedure? Ask your health care provider about: Changing or stopping your regular medicines. This is especially important if you are taking diabetes medicines or blood thinners. Taking medicines such as aspirin and ibuprofen. These medicines can thin your blood. Do not take these medicines unless your health care provider tells you to take them. Taking over-the-counter medicines, vitamins, herbs, and supplements. If you have an inflamed or infected cyst, you may have to take antibiotic medicine before the cyst removal. Take your antibiotic as told by your health care provider. Do not stop  taking the antibiotic even if you start to feel better. Take a shower on the morning of your procedure. Your health care provider may ask you to use a germ-killing soap. What happens during the procedure?  You will be given a medicine to numb the area (local anesthetic). The skin around the cyst will be cleaned with a germ-killing solution. The health care provider will make a small incision in your skin over the cyst. The health care provider will separate the cyst from the surrounding tissues that are under your skin. If possible, the cyst will be removed undamaged (intact). If the cyst bursts (ruptures), it will be removed in pieces. After the cyst is removed, the health care provider will control any bleeding and close the incision with small stitches (sutures). Small incisions may not need sutures, and the bleeding will be controlled by applying direct pressure with gauze. The health care provider may apply antibiotic ointment and a bandage (dressing) over the incision. The procedure may vary among health care providers and hospitals. What happens after the procedure? If you are prescribed an antibiotic medicine or ointment, take or apply it as told by your health care provider. Do not stop using the antibiotic even if you start to feel better. Summary Epidermoid cyst removal is a procedure to remove a sac that has formed under your skin. You may need this procedure if you have an epidermoid cyst that becomes large, uncomfortable, or inflamed. The health care provider will make a small incision in your skin to remove the cyst. If you are prescribed an antibiotic medicine before the procedure, after the procedure, or both, use the antibiotic as told by your health care provider. Do  not stop using the antibiotic even if you start to feel better. This information is not intended to replace advice given to you by your health care provider. Make sure you discuss any questions you have with your  health care provider. Document Revised: 07/10/2019 Document Reviewed: 07/10/2019 Elsevier Patient Education  2023 [employer].

## 2022-09-08 NOTE — Progress Notes (Signed)
Patient ID: Hayley Howe, female   DOB: 1945/10/29, 77 y.o.   MRN: [id number]  Chief Complaint: Dermal cyst of right elbow area, and mid thoracic back.  History of Present Illness Hayley Howe is a 77 y.o. female with the above cysts, present for over a year, the back 5 years.  She reports the back cyst has remained quite stable over time, but the right arm cyst is getting larger.  She denies any history of inflammation, pain tenderness or fever or chills.  She has no history of drainage or incision and drainage.  She denies pain.  Past Medical History Past Medical History:  Diagnosis Date   Asthma    Hyperlipidemia    Hypertension    Insomnia    Low back pain    Obesity    Osteoarthritis of left knee       Past Surgical History:  Procedure Laterality Date   ABDOMINAL HYSTERECTOMY     BREAST BIOPSY Right 01/21/2013   stereo - benign   COLONOSCOPY WITH PROPOFOL N/A 06/30/2016   Procedure: COLONOSCOPY WITH PROPOFOL;  Surgeon: Wyline Mood, MD;  Location: [facility] ENDOSCOPY;  Service: Endoscopy;  Laterality: N/A;   COLONOSCOPY WITH PROPOFOL N/A 09/02/2021   Procedure: COLONOSCOPY WITH PROPOFOL;  Surgeon: Wyline Mood, MD;  Location: [facility] ENDOSCOPY;  Service: Gastroenterology;  Laterality: N/A;   KNEE SURGERY Left 2012   Dr. Rosita Kea   OOPHORECTOMY     TUBAL LIGATION     VAGINAL PROLAPSE REPAIR      No Known Allergies  Current Outpatient Medications  Medication Sig Dispense Refill   albuterol (VENTOLIN HFA) 108 (90 Base) MCG/ACT inhaler Inhale 2 puffs into the lungs every 6 (six) hours as needed for wheezing or shortness of breath. 8 g 6   amLODipine (NORVASC) 10 MG tablet Take 1 tablet (10 mg total) by mouth daily. 90 tablet 1   aspirin EC 81 MG tablet Take 1 tablet (81 mg total) by mouth daily. 30 tablet 0   Azelastine-Fluticasone 137-50 MCG/ACT SUSP Place 1 spray into both nostrils 2 (two) times daily. 69 g 1   celecoxib (CELEBREX) 200 MG capsule Take 1 capsule by mouth daily as  needed.     Cholecalciferol (VITAMIN D) 2000 UNITS tablet Take 1 tablet by mouth daily.     DUPIXENT 300 MG/2ML SOPN INJECT 1 PEN SUBCUTANEOUSLY EVERY TWO WEEKS 12 mL 0   Fluticasone-Umeclidin-Vilant (TRELEGY ELLIPTA) 200-62.5-25 MCG/ACT AEPB Inhale 1 puff into the lungs daily. 28 each 11   loratadine (CLARITIN) 10 MG tablet Take 1 tablet (10 mg total) by mouth daily. 30 tablet 0   Magnesium 250 MG TABS Take 1 tablet by mouth daily.     methocarbamol (ROBAXIN) 500 MG tablet Take 1 tablet (500 mg total) by mouth every evening. 90 tablet 0   Multiple Vitamin (MULTIVITAMIN ADULT PO) Take by mouth.     rosuvastatin (CRESTOR) 10 MG tablet Take 1 tablet (10 mg total) by mouth daily. 90 tablet 1   tolterodine (DETROL LA) 2 MG 24 hr capsule Take 1 capsule (2 mg total) by mouth daily. 90 capsule 1   traMADol (ULTRAM) 50 MG tablet Take 1 tablet (50 mg total) by mouth every 12 (twelve) hours as needed for severe pain (back pain). 90 tablet 0   valsartan-hydrochlorothiazide (DIOVAN-HCT) 160-12.5 MG tablet Take 1 tablet by mouth daily. 90 tablet 1   vitamin C (ASCORBIC ACID) 500 MG tablet Take 1 tablet by mouth daily.  zolpidem (AMBIEN CR) 12.5 MG CR tablet Take 1 tablet (12.5 mg total) by mouth at bedtime. 90 tablet 0   No current facility-administered medications for this visit.    Family History Family History  Problem Relation Age of Onset   Breast cancer Sister 68       in year 2013   Hyperlipidemia Mother    Diabetes Mother    Cancer Father        Leukemia   Hyperlipidemia Father    Lung cancer Brother    Breast cancer Sister    Heart failure Brother       Social History Social History   Tobacco Use   Smoking status: Former    Packs/day: 0.25    Years: 1.00    Additional pack years: 0.00    Total pack years: 0.25    Types: Cigarettes    Start date: 08/13/1967    Quit date: 04/18/1977    Years since quitting: 45.4   Smokeless tobacco: Never   Tobacco comments:    smoking  cessation materials not required  Vaping Use   Vaping Use: Never used  Substance Use Topics   Alcohol use: No    Alcohol/week: 0.0 standard drinks of alcohol   Drug use: No        Review of Systems  Constitutional: Negative.   Eyes: Negative.   Respiratory: Negative.    Cardiovascular: Negative.   Gastrointestinal: Negative.   Genitourinary: Negative.   Musculoskeletal: Negative.   Skin: Negative.   Neurological: Negative.   Psychiatric/Behavioral: Negative.       Physical Exam Blood pressure (!) 168/75, pulse 66, temperature 98.4 F (36.9 C), temperature source Oral, height 5\' 4"  (1.626 m), weight 184 lb 12.8 oz (83.8 kg), SpO2 97 %. Last Weight  Most recent update: 09/08/2022 10:42 AM    Weight  83.8 kg (184 lb 12.8 oz)             CONSTITUTIONAL: Well developed, and nourished, appropriately responsive and aware without distress.   EYES: Sclera non-icteric.   EARS, NOSE, MOUTH AND THROAT:  The oropharynx is clear. Oral mucosa is pink and moist.    Hearing is intact to voice.  NECK: Trachea is midline, and there is no jugular venous distension.  LYMPH NODES:  Lymph nodes in the neck are not appreciated. RESPIRATORY:   Normal respiratory effort without pathologic use of accessory muscles. CARDIOVASCULAR:  Well perfused.  GI: The abdomen is  soft, nontender, and nondistended. MUSCULOSKELETAL:  Symmetrical muscle tone appreciated in all four extremities.    SKIN: Skin turgor is normal. No pathologic skin lesions appreciated.   There is a dermal cyst without evidence of inflammation, approximately 4 to 5 cm in diameter, mobile and seems to have minimal dermal attachment aside from the punctum.  This is located at the proximal right forearm on the posterior aspect near the elbow.  There is a mid thoracic back dermal cyst approximately 3-1/2 to 4 cm in diameter, not raised so much as the right forearm lesion.  NEUROLOGIC:  Motor and sensation appear grossly normal.   Cranial nerves are grossly without defect. PSYCH:  Alert and oriented to person, place and time. Affect is appropriate for situation.  Data Reviewed I have personally reviewed what is currently available of the patient's imaging, recent labs and medical records.   Labs:     Latest Ref Rng & Units 08/30/2022    8:32 AM 12/21/2021    2:45 PM 08/07/2021  6:12 AM  CBC  WBC 3.8 - 10.8 Thousand/uL 7.0  5.4  6.8   Hemoglobin 11.7 - 15.5 g/dL 16.1  09.6  04.5   Hematocrit 35.0 - 45.0 % 34.2  35.7  32.3   Platelets 140 - 400 Thousand/uL 324  313  320       Latest Ref Rng & Units 08/30/2022    8:32 AM 12/21/2021    2:45 PM 08/07/2021    6:12 AM  CMP  Glucose 65 - 99 mg/dL 83  87  95   BUN 7 - 25 mg/dL 27  21  14    Creatinine 0.60 - 1.00 mg/dL 4.09  8.11  9.14   Sodium 135 - 146 mmol/L 140  138  140   Potassium 3.5 - 5.3 mmol/L 4.5  4.3  3.0   Chloride 98 - 110 mmol/L 102  102  107   CO2 20 - 32 mmol/L 26  27  25    Calcium 8.6 - 10.4 mg/dL 9.6  78.2  8.7   Total Protein 6.1 - 8.1 g/dL 7.5  7.2    Total Bilirubin 0.2 - 1.2 mg/dL 0.2  0.2    AST 10 - 35 U/L 28  34    ALT 6 - 29 U/L 26  38        Imaging: Radiological images reviewed:   Within last 24 hrs: No results found.  Assessment    Dermal/sebaceous cysts of the right elbow and mid thoracic back. Patient Active Problem List   Diagnosis Date Noted   Severe persistent asthma without complication 07/14/2022   History of total hip arthroplasty 01/05/2022   Primary osteoarthritis of right hip 12/21/2021   Anemia 12/21/2021   HTN (hypertension) 08/06/2021   HLD (hyperlipidemia) 08/06/2021   Kidney lesion 08/06/2021   Chronic rhinitis 07/02/2021   Senile purpura (HCC) 05/14/2020   Moderate persistent asthma with allergic rhinitis without complication 05/14/2020   Pre-diabetes 05/14/2020   Hyperglycemia 07/13/2017   Chronic constipation 12/15/2016   Primary osteoarthritis of left hip 11/05/2015   Scoliosis 11/05/2015    Chronic right-sided low back pain with right-sided sciatica 10/30/2014   Benign essential HTN 10/27/2014   Bradycardia 10/27/2014   Chronic insomnia 10/27/2014   Headache, temporal 10/27/2014   Hypercholesteremia 10/27/2014   Obesity (BMI 30-39.9) 10/27/2014   Changing sleep-work schedule, affecting sleep 10/27/2014   Vitamin D deficiency 10/27/2014   Arthritis of knee, degenerative 09/24/2009    Plan    Will have her hold her aspirin, and return in 1 week for excision of the right proximal forearm cyst, and defer the mid back cyst for a later opportunity.  Face-to-face time spent with the patient and accompanying care providers(if present) was 25 minutes, with more than 50% of the time spent counseling, educating, and coordinating care of the patient.    These notes generated with voice recognition software. I apologize for typographical errors.  Campbell Lerner M.D., FACS 09/08/2022, 10:44 AM

## 2022-09-15 ENCOUNTER — Encounter: Payer: Self-pay | Admitting: Surgery

## 2022-09-15 ENCOUNTER — Ambulatory Visit: Payer: BC Managed Care – PPO | Admitting: Surgery

## 2022-09-15 VITALS — BP 159/76 | HR 72 | Temp 98.0°F | Ht 64.0 in | Wt 184.0 lb

## 2022-09-15 DIAGNOSIS — L723 Sebaceous cyst: Secondary | ICD-10-CM | POA: Diagnosis not present

## 2022-09-15 NOTE — Patient Instructions (Addendum)
We have removed a Cyst in our office today.  You have sutures under the skin that will dissolve and also dermabond (skin glue) on top of your skin which will come off on it's own in 10-14 days.  You may use Ibuprofen or Tylenol as needed for pain control. Use the ice pack 3-4 times a day for the next two days for any achiness.  You may shower tomorrow, do not scrub at the area and do not immerse in water until fully healed.  Avoid Strenuous activities that will make you sweat during the next 48 hours to avoid the glue coming off prematurely. Avoid activities that will place pressure to this area of the body for 1-2 weeks to avoid re-injury to incision site.  Please see your follow-up appointment provided. We will see you back in office to make sure this area is healed and to review the final pathology. If you have any questions or concerns prior to this appointment, call our office and speak with a nurse.    Excision of Skin Cysts or Lesions Excision of a skin lesion refers to the removal of a section of skin by making small cuts (incisions) in the skin. This procedure may be done to remove a cancerous (malignant) or noncancerous (benign) growth on the skin. It is typically done to treat or prevent cancer or infection. It may also be done to improve cosmetic appearance. The procedure may be done to remove: Cancerous growths, such as basal cell carcinoma, squamous cell carcinoma, or melanoma. Noncancerous growths, such as a cyst or lipoma. Growths, such as moles or skin tags, which may be removed for cosmetic reasons.  Various excision or surgical techniques may be used depending on your condition, the location of the lesion, and your overall health. Tell a health care provider about: Any allergies you have. All medicines you are taking, including vitamins, herbs, eye drops, creams, and over-the-counter medicines. Any problems you or family members have had with anesthetic medicines. Any  blood disorders you have. Any surgeries you have had. Any medical conditions you have. Whether you are pregnant or may be pregnant. What are the risks? Generally, this is a safe procedure. However, problems may occur, including: Bleeding. Infection. Scarring. Recurrence of the cyst, lipoma, or cancer. Changes in skin sensation or appearance, such as discoloration or swelling. Reaction to the anesthetics. Allergic reaction to surgical materials or ointments. Damage to nerves, blood vessels, muscles, or other structures. Continued pain.  What happens before the procedure? Ask your health care provider about: Changing or stopping your regular medicines. This is especially important if you are taking diabetes medicines or blood thinners. Taking medicines such as aspirin and ibuprofen. These medicines can thin your blood. Do not take these medicines before your procedure if your health care provider instructs you not to. You may be asked to take certain medicines. You may be asked to stop smoking. You may have an exam or testing. What happens during the procedure? To reduce your risk of infection: Your health care team will wash or sanitize their hands. Your skin will be washed with soap. You will be given a medicine to numb the area (local anesthetic). One of the following excision techniques will be performed. At the end of any of these procedures, antibiotic ointment will be applied as needed. Each of the following techniques may vary among health care providers and hospitals. Complete Surgical Excision The area of skin that needs to be removed will be marked with a  pen. Using a small scalpel or scissors, the surgeon will gently cut around and under the lesion until it is completely removed. The lesion will be placed in a fluid and sent to the lab for examination. If necessary, bleeding will be controlled with a device that delivers heat (electrocautery). The edges of the wound may be  stitched (sutured) together, and a bandage (dressing) will be applied. This procedure may be performed to treat a cancerous growth or a noncancerous cyst or lesion. Excision of a Cyst The surgeon will make an incision on the cyst. The entire cyst will be removed through the incision. The incision may be closed with sutures and surgical glue.  What happens after the procedure? Return to your normal activities as told by your health care provider.

## 2022-09-15 NOTE — Progress Notes (Signed)
Excision of 4.3 cm dermal cyst from proximal dorsal right forearm.   Pre-operative Diagnosis: 4.3 cm dermal cyst, of the proximal dorsal right forearm  Post-operative Diagnosis: same.    Surgeon: Campbell Lerner, M.D., FACS  Anesthesia: Local, 1% lidocaine with epinephrine.  Findings: Consistent with well-defined dermal cyst, without evidence of inflammation, significantly raised, and problematic considering its location.  Estimated Blood Loss: 3 mL         Specimens: Dermal cyst with overlying skin, discarded as benign.          Complications: none              Procedure Details  The patient was seen again in Room 9. The benefits, complications, treatment options, and expected outcomes were discussed with the patient. The risks of bleeding, infection, recurrence of symptoms, failure to resolve symptoms, unanticipated injury. The likelihood of improving the patient's symptoms with return to their baseline status is expected.  The patient and/or family concurred with the proposed plan, giving informed consent.  The procedure verified.    The patient was positioned in the prone position and the posterior proximal right forearm was prepped with  Chloraprep and draped in the sterile fashion.  A Time Out was held and the above information confirmed.  Local infiltration with 1% lidocaine with epinephrine is utilized to function as a local anesthetic but also utilized to create a dissection plane adjacent to the cyst.  An elliptical incision is made, long axis parallel to the long axis of the ulna.  The underlying dermal cyst is sharply excised from the surrounding soft tissues overlying skin circumferentially, mobilizing it completely.  It was removed intact.  Hemostasis obtained with pressure.  The skin was then reapproximated with interrupted 3-0 Vicryl dermal sutures.  A running 4-0 Monocryl subcuticular was utilized to approximate the skin, and Dermabond is the dressing sealing the  incision.  Dermabond instructions were given as that noted below.  Patient tolerated procedure well. Your incision was closed with Dermabond.  It is best to keep it clean and dry, it will tolerate a brief shower, but do not soak it or apply any creams or lotions to the incisions.  The Dermabond should gradually flake off over time.  Keep it open to air so you can evaluate your incisions.  Dermabond assists the underlying sutures to keep your incision closed and protected from infection.  Should you develop some drainage from your incision, some drops of drainage would be okay but if it persists continue to put keep a dry dressing over it.    She will follow-up in approximately 2 weeks for wound recheck or as needed.   Campbell Lerner M.D., [facility] 09/15/2022 9:58 AM

## 2022-09-22 ENCOUNTER — Ambulatory Visit (INDEPENDENT_AMBULATORY_CARE_PROVIDER_SITE_OTHER): Payer: Medicare HMO | Admitting: Surgery

## 2022-09-22 ENCOUNTER — Encounter: Payer: Self-pay | Admitting: Surgery

## 2022-09-22 VITALS — BP 163/80 | HR 60 | Temp 98.1°F | Ht 64.0 in | Wt 184.0 lb

## 2022-09-22 DIAGNOSIS — Z09 Encounter for follow-up examination after completed treatment for conditions other than malignant neoplasm: Secondary | ICD-10-CM

## 2022-09-22 DIAGNOSIS — L723 Sebaceous cyst: Secondary | ICD-10-CM

## 2022-09-22 NOTE — Patient Instructions (Addendum)
Follow-up with our office as needed.  Please call and ask to speak with a nurse if you develop questions or concerns.  The glue will come off on its own in 1-2 weeks.   We will have you in to remove your cyst on your back in 1 month.

## 2022-09-22 NOTE — Progress Notes (Signed)
[facility] POST-OP OFFICE VISIT  09/22/2022  HPI: Hayley Howe is a 77 y.o. female 14 days s/p cyst excision from dorsal right forearm.  She has no complaints.  No swelling, no fevers no chills no complaint of pain or tenderness.  Vital signs: BP (!) 163/80   Pulse 60   Temp 98.1 F (36.7 C)   Ht 5\' 4"  (1.626 m)   Wt 184 lb (83.5 kg)   SpO2 97%   BMI 31.58 kg/m    Physical Exam: Constitutional: She appears well.  Skin: Right dorsal forearm incisions still have is Dermabond intact flaking off of the perimeter.  There is no swelling, no edema, there may be a slight purple discoloration distal to this point perhaps with some mild ecchymosis.  Assessment/Plan: This is a 77 y.o. female 14 days s/p excision of right forearm dermal cyst.  Patient Active Problem List   Diagnosis Date Noted   Sebaceous cyst 09/08/2022   Severe persistent asthma without complication 07/14/2022   History of total hip arthroplasty 01/05/2022   Primary osteoarthritis of right hip 12/21/2021   Anemia 12/21/2021   HTN (hypertension) 08/06/2021   HLD (hyperlipidemia) 08/06/2021   Kidney lesion 08/06/2021   Chronic rhinitis 07/02/2021   Senile purpura (HCC) 05/14/2020   Moderate persistent asthma with allergic rhinitis without complication 05/14/2020   Pre-diabetes 05/14/2020   Hyperglycemia 07/13/2017   Chronic constipation 12/15/2016   Primary osteoarthritis of left hip 11/05/2015   Scoliosis 11/05/2015   Chronic right-sided low back pain with right-sided sciatica 10/30/2014   Benign essential HTN 10/27/2014   Bradycardia 10/27/2014   Chronic insomnia 10/27/2014   Headache, temporal 10/27/2014   Hypercholesteremia 10/27/2014   Obesity (BMI 30-39.9) 10/27/2014   Changing sleep-work schedule, affecting sleep 10/27/2014   Vitamin D deficiency 10/27/2014   Arthritis of knee, degenerative 09/24/2009    -She still wants to have the cyst excised from her mid back, and so we will  allow her to attain complete healing of her right forearm before be initiate that procedure, will have her back in a month.   Campbell Lerner M.D., FACS 09/22/2022, 9:46 AM

## 2022-10-27 ENCOUNTER — Encounter: Payer: Self-pay | Admitting: Surgery

## 2022-10-27 ENCOUNTER — Ambulatory Visit (INDEPENDENT_AMBULATORY_CARE_PROVIDER_SITE_OTHER): Payer: BC Managed Care – PPO | Admitting: Surgery

## 2022-10-27 VITALS — BP 149/89 | HR 61 | Temp 98.1°F | Ht 64.0 in | Wt 181.6 lb

## 2022-10-27 DIAGNOSIS — L723 Sebaceous cyst: Secondary | ICD-10-CM | POA: Diagnosis not present

## 2022-10-27 NOTE — Progress Notes (Signed)
Excision of 3.6 cm sebaceous cyst from mid thoracic back.  Pre-operative Diagnosis: Sebaceous cyst, mid thoracic back.  Post-operative Diagnosis: same.    Surgeon: Campbell Lerner, M.D., FACS  Anesthesia: Local   Findings: As expected, consistent with benign cyst, completely removed.  Estimated Blood Loss: 2 mL         Specimens: Consistent with findings.  No pathology sent.  Specimen discarded.          Complications: none              Procedure Details  The patient was evaluated, the benefits, complications, treatment options, and expected outcomes were discussed with the patient. The risks of bleeding, infection, recurrence of symptoms, failure to resolve symptoms, unanticipated injury, any of which could require further surgery were reviewed with the patient. The likelihood of improving the patient's symptoms with return to their baseline status is expected.  The patient and/or family concurred with the proposed plan, giving informed consent.  The patient was taken to our procedure room, identified and the procedure verified.    The patient was positioned in the prone position and the back was prepped with  Chloraprep and draped in the sterile fashion.  A Time Out was held and the above information confirmed.  Local infiltration with 1% lidocaine with epinephrine is instilled to adequate anesthetic effect.  A midline vertical elliptical incision is made to excise the punctum.  Sharp dissection was utilized to spare as much skin as feasible and excise the cyst in its entirety.  Hemostasis maintained.  The wound was then irrigated.  Closure of the dermis was completed with 3 sutures of 3-0 nylon utilizing vertical mattress sutures.  Sterile dry dressing applied.  Patient tolerated procedure well.  She will follow-up in less than 2 weeks for suture removal.

## 2022-10-27 NOTE — Patient Instructions (Addendum)
If you have any concerns or questions, please feel free to call our office. See follow up appointment.   Epidermoid Cyst Removal, Care After This sheet gives you information about how to care for yourself after your procedure. Your health care provider may also give you more specific instructions. If you have problems or questions, contact your health care provider. What can I expect after the procedure? After the procedure, it is common to have: Soreness in the area where your cyst was removed. Tightness or itchiness from the stitches (sutures) in your skin. Follow these instructions at home: Medicines Take over-the-counter and prescription medicines only as told by your health care provider. If you were prescribed an antibiotic medicine or ointment, take or apply it as told by your health care provider. Do not stop using the antibiotic even if you start to feel better. Incision care  Follow instructions from your health care provider about how to take care of your incision. Make sure you: Wash your hands with soap and water for at least 20 seconds before you change your bandage (dressing). If soap and water are not available, use hand sanitizer. Change your dressing as told by your health care provider. Leave sutures, skin glue, or adhesive strips in place. These skin closures may need to stay in place for 1-2 weeks or longer. If adhesive strip edges start to loosen and curl up, you may trim the loose edges. Do not remove adhesive strips completely unless your health care provider tells you to do that. Keep the dressing dry until your health care provider says that it can be removed. After your dressing is off, check your incision area every day for signs of infection. Check for: Redness, swelling, or pain. Fluid or blood. Warmth. Pus or a bad smell. General instructions Do not take baths, swim, or use a hot tub until your health care provider approves. Ask your health care provider if you  may take showers. You may only be allowed to take sponge baths. Your health care provider may ask you to avoid contact sports or activities that take a lot of effort. Do not do anything that stretches or puts pressure on your incision. You can return to your normal diet. Keep all follow-up visits. This is important. Contact a health care provider if: You have a fever. You have redness, swelling, or pain in the incision area. You have fluid or blood coming from your incision. You have pus or a bad smell coming from your incision. Your incision feels warm to the touch. Your cyst grows back. Get help right away if: If the incision site suddenly increases in size and you have pain at the incision site. You may be checked for a collection of blood under the skin from the procedure (hematoma). Summary After the procedure, it is common to have soreness in the area where your cyst was removed. Take or apply over-the-counter and prescription medicines only as told by your health care provider. Follow instructions from your health care provider about how to take care of your incision. This information is not intended to replace advice given to you by your health care provider. Make sure you discuss any questions you have with your health care provider. Document Revised: 07/10/2019 Document Reviewed: 07/10/2019 Elsevier Patient Education  2024 [employer].

## 2022-11-03 ENCOUNTER — Encounter: Payer: Medicare HMO | Admitting: Physician Assistant

## 2022-11-08 ENCOUNTER — Ambulatory Visit (INDEPENDENT_AMBULATORY_CARE_PROVIDER_SITE_OTHER): Payer: BC Managed Care – PPO | Admitting: Physician Assistant

## 2022-11-08 VITALS — BP 164/77 | HR 70 | Temp 97.9°F | Wt 183.8 lb

## 2022-11-08 DIAGNOSIS — Z09 Encounter for follow-up examination after completed treatment for conditions other than malignant neoplasm: Secondary | ICD-10-CM | POA: Diagnosis not present

## 2022-11-08 DIAGNOSIS — L723 Sebaceous cyst: Secondary | ICD-10-CM | POA: Diagnosis not present

## 2022-11-08 NOTE — Progress Notes (Signed)
[facility] POST-OP OFFICE VISIT  11/08/2022  HPI: Hayley Howe is a 77 y.o. female 12 days s/p excision of 3.6 cm sebaceous cyst to the thoracic back with Dr Claudine Mouton  She is doing well Incision is sore but improving No fever, chills No drainage from the wound No other complaints   Vital signs: BP (!) 164/77   Pulse 70   Temp 97.9 F (36.6 C) (Oral)   Wt 183 lb 12.8 oz (83.4 kg)   SpO2 98%   BMI 31.55 kg/m    Physical Exam: Constitutional: Well appearing female, NAD Skin: 4 cm incision to the mid upper thoracic back, this is well healed, no erythema nor swelling. 3 nylon vertical mattress sutures present (removed)  Assessment/Plan: This is a 77 y.o. female 12 days s/p excision of 3.6 cm sebaceous cyst to the thoracic back with Dr Claudine Mouton   - Sutures removed without incident   - Pain control prn  - Reviewed wound care recommendation  - She can follow up on as needed basis; She understands to call with questions/concerns  -- Lynden Oxford, PA-C [facility] 11/08/2022, 3:17 PM M-F: 7am - 4pm

## 2022-11-08 NOTE — Patient Instructions (Signed)
Excision of Skin Lesions, Care After The following information offers guidance on how to care for yourself after your procedure. Your health care provider may also give you more specific instructions. If you have problems or questions, contact your health care provider. What can I expect after the procedure? After your procedure, it is common to have: Soreness or mild pain. Some redness and swelling. Follow these instructions at home: Excision site care  Follow instructions from your health care provider about how to take care of your excision site. Make sure you: Wash your hands with soap and water for at least 20 seconds before and after you change your bandage (dressing). If soap and water are not available, use hand sanitizer. Change your dressing as told by your health care provider. Leave stitches (sutures), skin glue, or adhesive strips in place. These skin closures may need to stay in place for 2 weeks or longer. If adhesive strip edges start to loosen and curl up, you may trim the loose edges. Do not remove adhesive strips completely unless your health care provider tells you to do that. Check the excision area every day for signs of infection. Watch for: More redness, swelling, or pain. Fluid or blood. Warmth. Pus or a bad smell. Keep the site clean, dry, and protected for at least 48 hours. For bleeding, apply gentle but firm pressure to the area using a folded towel for 20 minutes. Do not take baths, swim, or use a hot tub until your health care provider approves. Ask your health care provider if you may take showers. You may only be allowed to take sponge baths. General instructions Take over-the-counter and prescription medicines only as told by your health care provider. Follow instructions from your health care provider about how to minimize scarring. Scarring should lessen over time. Avoid sun exposure until the area has healed. Use sunscreen to protect the area from the sun  after it has healed. Avoid high-impact exercise and activities until the sutures are removed or the area heals. Keep all follow-up visits. This is important. Contact a health care provider if: You have more redness, swelling, or pain around your excision site. You have fluid or blood coming from your excision site. Your excision site feels warm to the touch. You have pus or a bad smell coming from your excision site. You have a fever. You have pain that does not improve in 2-3 days after your procedure. Get help right away if: You have bleeding that does not stop with pressure or a dressing. Your wound opens up. Summary Take over-the-counter and prescription medicines only as told by your health care provider. Change your dressing as told by your health care provider. Contact a health care provider if you have redness, swelling, pain, or other signs of infection around your excision site. Keep all follow-up visits. This is important. This information is not intended to replace advice given to you by your health care provider. Make sure you discuss any questions you have with your health care provider. Document Revised: 11/03/2020 Document Reviewed: 11/03/2020 Elsevier Patient Education  2024 Elsevier Inc.  

## 2022-12-29 NOTE — Progress Notes (Signed)
Name: Hayley Howe   MRN: [id number]    DOB: 1945/08/19   Date:12/30/2022       Progress Note  Subjective  Chief Complaint  Follow Up  HPI  Urge incontinence: she takes detrol prn only. Marland KitchenHistory of bladder tack many years ago, symptoms are stable    Chronic sinusitis/AR/Ashma : she was seen by Dr. Marcos Eke 21  - pulmonologist , had a normal spirometry, CT chest  showed some scarring, CT sinus showed chronic sinusitis back in 2020  She is now on Dupixent , singulair at night and also Stable Denies SOB or wheezing , having some congestion now   Chronic Low Back: she has a long history of low back pain, not secondary to trauma, DDD lumbar spine and also scoliosis. She is due for Tramadol refill today . She is now working 4 days a week with a break after two days.    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 She sleeps from around 2 pm-8:30pm for about 5-7 hours when she sleeps during the day She takes Ambien , denies side effects of medication, she is aware of FDA guidelines however unable to sleep with lower dose of Ambien. She only takes medications the nights that she works but needs a refill today   OA:  s/p left knee replaced, very seldom has pain or discomfort since  surgery was done by Dr. Rosita Kea in 2012 , she had  right hip replacement by Dr. Odis Luster 12/2021, taking celebrex and doing well    Hyperglycemia: last hgbA1C was 6.3 % denies polyphagia, polydipsia or polyuria.  Continue low carb diet we will recheck labs today  .  HTN: taking valsartan hctz, and also Norvasc, she states no longer having dizziness, no chest pain or palpitation .BP is up but did not take medication this morning    Dyslipidemia: last LDL stable on Crestor , no myalgia. Last LDL was down to 70's   Senile Purpura: usually on arms , stable. Reassurance given   Tingling on fingers on both hands: usually thumb, index finger and sometimes middle finger on both hands,  denies neck pain, going on for months. I will check B12 and folate She needs to discuss it with ortho  Patient Active Problem List   Diagnosis Date Noted   Sebaceous cyst 09/08/2022   Severe persistent asthma without complication 07/14/2022   History of total hip arthroplasty 01/05/2022   Primary osteoarthritis of right hip 12/21/2021   Anemia 12/21/2021   HTN (hypertension) 08/06/2021   HLD (hyperlipidemia) 08/06/2021   Kidney lesion 08/06/2021   Chronic rhinitis 07/02/2021   Senile purpura (HCC) 05/14/2020   Moderate persistent asthma with allergic rhinitis without complication 05/14/2020   Pre-diabetes 05/14/2020   Hyperglycemia 07/13/2017   Chronic constipation 12/15/2016   Primary osteoarthritis of left hip 11/05/2015   Scoliosis 11/05/2015   Chronic right-sided low back pain with right-sided sciatica 10/30/2014   Benign essential HTN 10/27/2014   Bradycardia 10/27/2014   Chronic insomnia 10/27/2014   Headache, temporal 10/27/2014   Hypercholesteremia 10/27/2014   Obesity (BMI 30-39.9) 10/27/2014   Changing sleep-work schedule, affecting sleep 10/27/2014   Vitamin D deficiency 10/27/2014   Arthritis of knee, degenerative 09/24/2009    Past Surgical History:  Procedure Laterality Date   ABDOMINAL HYSTERECTOMY     BREAST BIOPSY Right 01/21/2013   stereo - benign   COLONOSCOPY WITH PROPOFOL N/A 06/30/2016   Procedure: COLONOSCOPY WITH PROPOFOL;  Surgeon: Wyline Mood,  MD;  Location: [facility] ENDOSCOPY;  Service: Endoscopy;  Laterality: N/A;   COLONOSCOPY WITH PROPOFOL N/A 09/02/2021   Procedure: COLONOSCOPY WITH PROPOFOL;  Surgeon: Wyline Mood, MD;  Location[facility] ENDOSCOPY;  Service: Gastroenterology;  Laterality: N/A;   KNEE SURGERY Left 2012   Dr. Rosita Kea   OOPHORECTOMY     TUBAL LIGATION     VAGINAL PROLAPSE REPAIR      Family History  Problem Relation Age of Onset   Breast cancer Sister 60       in year 2013   Hyperlipidemia Mother    Diabetes Mother    Cancer Father         Leukemia   Hyperlipidemia Father    Lung cancer Brother    Breast cancer Sister    Heart failure Brother     Social History   Tobacco Use   Smoking status: Former    Current packs/day: 0.00    Average packs/day: 0.3 packs/day for 9.7 years (2.4 ttl pk-yrs)    Types: Cigarettes    Start date: 08/13/1967    Quit date: 04/18/1977    Years since quitting: 45.7    Passive exposure: Past   Smokeless tobacco: Never   Tobacco comments:    smoking cessation materials not required  Substance Use Topics   Alcohol use: No    Alcohol/week: 0.0 standard drinks of alcohol     Current Outpatient Medications:    albuterol (VENTOLIN HFA) 108 (90 Base) MCG/ACT inhaler, Inhale 2 puffs into the lungs every 6 (six) hours as needed for wheezing or shortness of breath., Disp: 8 g, Rfl: 6   amLODipine (NORVASC) 10 MG tablet, Take 1 tablet (10 mg total) by mouth daily., Disp: 90 tablet, Rfl: 1   aspirin EC 81 MG tablet, Take 1 tablet (81 mg total) by mouth daily., Disp: 30 tablet, Rfl: 0   Azelastine-Fluticasone 137-50 MCG/ACT SUSP, Place 1 spray into both nostrils 2 (two) times daily., Disp: 69 g, Rfl: 1   celecoxib (CELEBREX) 200 MG capsule, Take 1 capsule by mouth daily as needed., Disp: , Rfl:    Cholecalciferol (VITAMIN D) 2000 UNITS tablet, Take 1 tablet by mouth daily., Disp: , Rfl:    DUPIXENT 300 MG/2ML SOPN, INJECT 1 PEN SUBCUTANEOUSLY EVERY TWO WEEKS, Disp: 12 mL, Rfl: 0   Fluticasone-Umeclidin-Vilant (TRELEGY ELLIPTA) 200-62.5-25 MCG/ACT AEPB, Inhale 1 puff into the lungs daily., Disp: 28 each, Rfl: 11   loratadine (CLARITIN) 10 MG tablet, Take 1 tablet (10 mg total) by mouth daily., Disp: 30 tablet, Rfl: 0   Magnesium 250 MG TABS, Take 1 tablet by mouth daily., Disp: , Rfl:    methocarbamol (ROBAXIN) 500 MG tablet, Take 1 tablet (500 mg total) by mouth every evening., Disp: 90 tablet, Rfl: 0   Multiple Vitamin (MULTIVITAMIN ADULT PO), Take by mouth., Disp: , Rfl:    rosuvastatin  (CRESTOR) 10 MG tablet, Take 1 tablet (10 mg total) by mouth daily., Disp: 90 tablet, Rfl: 1   tolterodine (DETROL LA) 2 MG 24 hr capsule, Take 1 capsule (2 mg total) by mouth daily., Disp: 90 capsule, Rfl: 1   traMADol (ULTRAM) 50 MG tablet, Take 1 tablet (50 mg total) by mouth every 12 (twelve) hours as needed for severe pain (back pain)., Disp: 90 tablet, Rfl: 0   valsartan-hydrochlorothiazide (DIOVAN-HCT) 160-12.5 MG tablet, Take 1 tablet by mouth daily., Disp: 90 tablet, Rfl: 1   vitamin C (ASCORBIC ACID) 500 MG tablet, Take 1 tablet by mouth daily., Disp: ,  Rfl:    zolpidem (AMBIEN CR) 12.5 MG CR tablet, Take 1 tablet (12.5 mg total) by mouth at bedtime., Disp: 90 tablet, Rfl: 0  No Known Allergies  I personally reviewed active problem list, medication list, allergies, family history, social history, health maintenance with the patient/caregiver today.   ROS  Ten systems reviewed and is negative except as mentioned in HPI    Objective  Vitals:   12/30/22 0759 12/30/22 0808  BP: (!) 146/74 (!) 140/72  Pulse: 87   Resp: 16   SpO2: 100%   Weight: 181 lb (82.1 kg)   Height: 5\' 4"  (1.626 m)     Body mass index is 31.07 kg/m.  Physical Exam  Constitutional: Patient appears well-developed and well-nourished. Obese  No distress.  HEENT: head atraumatic, normocephalic, pupils equal and reactive to light, neck supple Cardiovascular: Normal rate, regular rhythm and normal heart sounds.  No murmur heard. No BLE edema. Pulmonary/Chest: Effort normal and breath sounds normal. No respiratory distress. Abdominal: Soft.  There is no tenderness. Psychiatric: Patient has a normal mood and affect. behavior is normal. Judgment and thought content normal.   PHQ2/9:    12/30/2022    7:59 AM 08/30/2022    7:38 AM 04/29/2022    7:41 AM 04/25/2022    8:22 AM 12/21/2021    1:57 PM  Depression screen PHQ 2/9  Decreased Interest 0 0 0 0 0  Down, Depressed, Hopeless 0 0 0 0 0  PHQ - 2 Score 0 0 0  0 0  Altered sleeping 0 0 0 0 0  Tired, decreased energy 0 0 0 0 0  Change in appetite 0 0 0 0 0  Feeling bad or failure about yourself  0 0 0 0 0  Trouble concentrating 0 0 0 0 0  Moving slowly or fidgety/restless 0 0 0 0 0  Suicidal thoughts 0 0 0 0 0  PHQ-9 Score 0 0 0 0 0  Difficult doing work/chores    Not difficult at all     phq 9 is negative   Fall Risk:    12/30/2022    7:58 AM 08/30/2022    7:38 AM 04/29/2022    7:41 AM 04/25/2022    8:22 AM 12/21/2021    1:57 PM  Fall Risk   Falls in the past year? 0 0 0 0 0  Number falls in past yr: 0 0 0 0   Injury with Fall? 0 0 0 0   Risk for fall due to : No Fall Risks No Fall Risks No Fall Risks  Impaired balance/gait  Follow up Falls prevention discussed Falls prevention discussed Falls prevention discussed  Falls prevention discussed;Education provided;Falls evaluation completed      Functional Status Survey: Is the patient deaf or have difficulty hearing?: No Does the patient have difficulty seeing, even when wearing glasses/contacts?: No Does the patient have difficulty concentrating, remembering, or making decisions?: No Does the patient have difficulty walking or climbing stairs?: No Does the patient have difficulty dressing or bathing?: No Does the patient have difficulty doing errands alone such as visiting a doctor's office or shopping?: No    Assessment & Plan  1. Senile purpura (HCC)  Reassurance   2. Benign essential HTN  She did not take bp medication this morning, advised to always take medication before office visit   3. Chronic bilateral low back pain without sciatica  - methocarbamol (ROBAXIN) 500 MG tablet; Take 1 tablet (500 mg total) by  mouth every evening.  Dispense: 90 tablet; Refill: 0 - traMADol (ULTRAM) 50 MG tablet; Take 1 tablet (50 mg total) by mouth every 12 (twelve) hours as needed for severe pain (back pain).  Dispense: 90 tablet; Refill: 0  4. Chronic insomnia  - zolpidem (AMBIEN CR)  12.5 MG CR tablet; Take 1 tablet (12.5 mg total) by mouth at bedtime.  Dispense: 90 tablet; Refill: 0  5. Moderate persistent asthma with allergic rhinitis without complication  Under the care of Dr. Marcos Eke   6. Need for immunization against influenza  - Flu Vaccine QUAD High Dose(Fluad)  7. History of total right hip replacement  On celebrex   8. Decreased GFR  - BASIC METABOLIC PANEL WITH GFR  9. Hyperglycemia  - Hemoglobin A1c  10. Breast cancer screening by mammogram  - MM 3D SCREENING MAMMOGRAM BILATERAL BREAST; Future  11. Urge incontinence of urine  - tolterodine (DETROL LA) 2 MG 24 hr capsule; Take 1 capsule (2 mg total) by mouth daily.  Dispense: 90 capsule; Refill: 1   12. Paresthesia of both hands  - B12 and Folate Panel

## 2022-12-30 ENCOUNTER — Encounter: Payer: Self-pay | Admitting: Family Medicine

## 2022-12-30 ENCOUNTER — Ambulatory Visit (INDEPENDENT_AMBULATORY_CARE_PROVIDER_SITE_OTHER): Payer: BC Managed Care – PPO | Admitting: Family Medicine

## 2022-12-30 VITALS — BP 140/72 | HR 87 | Resp 16 | Ht 64.0 in | Wt 181.0 lb

## 2022-12-30 DIAGNOSIS — N3941 Urge incontinence: Secondary | ICD-10-CM

## 2022-12-30 DIAGNOSIS — Z1231 Encounter for screening mammogram for malignant neoplasm of breast: Secondary | ICD-10-CM

## 2022-12-30 DIAGNOSIS — F5104 Psychophysiologic insomnia: Secondary | ICD-10-CM

## 2022-12-30 DIAGNOSIS — R202 Paresthesia of skin: Secondary | ICD-10-CM

## 2022-12-30 DIAGNOSIS — I10 Essential (primary) hypertension: Secondary | ICD-10-CM

## 2022-12-30 DIAGNOSIS — R739 Hyperglycemia, unspecified: Secondary | ICD-10-CM | POA: Diagnosis not present

## 2022-12-30 DIAGNOSIS — Z23 Encounter for immunization: Secondary | ICD-10-CM | POA: Diagnosis not present

## 2022-12-30 DIAGNOSIS — R944 Abnormal results of kidney function studies: Secondary | ICD-10-CM | POA: Diagnosis not present

## 2022-12-30 DIAGNOSIS — G8929 Other chronic pain: Secondary | ICD-10-CM | POA: Diagnosis not present

## 2022-12-30 DIAGNOSIS — D692 Other nonthrombocytopenic purpura: Secondary | ICD-10-CM | POA: Diagnosis not present

## 2022-12-30 DIAGNOSIS — J4540 Moderate persistent asthma, uncomplicated: Secondary | ICD-10-CM

## 2022-12-30 DIAGNOSIS — Z96641 Presence of right artificial hip joint: Secondary | ICD-10-CM

## 2022-12-30 DIAGNOSIS — M545 Low back pain, unspecified: Secondary | ICD-10-CM | POA: Diagnosis not present

## 2022-12-30 MED ORDER — METHOCARBAMOL 500 MG PO TABS
500.0000 mg | ORAL_TABLET | Freq: Every evening | ORAL | 0 refills | Status: DC
Start: 2022-12-30 — End: 2023-06-22

## 2022-12-30 MED ORDER — TOLTERODINE TARTRATE ER 2 MG PO CP24
2.0000 mg | ORAL_CAPSULE | Freq: Every day | ORAL | 1 refills | Status: DC
Start: 2022-12-30 — End: 2023-06-22

## 2022-12-30 MED ORDER — TRAMADOL HCL 50 MG PO TABS
50.0000 mg | ORAL_TABLET | Freq: Two times a day (BID) | ORAL | 0 refills | Status: DC | PRN
Start: 2022-12-30 — End: 2023-05-17

## 2022-12-30 MED ORDER — ZOLPIDEM TARTRATE ER 12.5 MG PO TBCR
12.5000 mg | EXTENDED_RELEASE_TABLET | Freq: Every day | ORAL | 0 refills | Status: DC
Start: 2022-12-30 — End: 2023-03-22

## 2022-12-31 LAB — BASIC METABOLIC PANEL WITH GFR
BUN: 17 mg/dL (ref 7–25)
CO2: 28 mmol/L (ref 20–32)
Calcium: 9.5 mg/dL (ref 8.6–10.4)
Chloride: 102 mmol/L (ref 98–110)
Creat: 0.93 mg/dL (ref 0.60–1.00)
Glucose, Bld: 89 mg/dL (ref 65–99)
Potassium: 4 mmol/L (ref 3.5–5.3)
Sodium: 139 mmol/L (ref 135–146)
eGFR: 64 mL/min/{1.73_m2} (ref >=60)

## 2022-12-31 LAB — HEMOGLOBIN A1C
Hgb A1c MFr Bld: 6.1 %{Hb} — ABNORMAL HIGH (ref <5.7)
Mean Plasma Glucose: 128 mg/dL
eAG (mmol/L): 7.1 mmol/L

## 2022-12-31 LAB — ADVANCED WRITTEN NOTIFICATION (AWN) TEST REFUSAL

## 2023-01-10 ENCOUNTER — Other Ambulatory Visit: Payer: Self-pay | Admitting: Pulmonary Disease

## 2023-01-10 DIAGNOSIS — J4550 Severe persistent asthma, uncomplicated: Secondary | ICD-10-CM

## 2023-01-13 ENCOUNTER — Telehealth: Payer: Self-pay | Admitting: Pulmonary Disease

## 2023-01-13 NOTE — Telephone Encounter (Signed)
Dupixent Prior Auth has expired. They are asking for an urgent request to be put in with her plan since she is now 2 days late in taking this med. Liam Graham is [employer]. OR CALL DIRECT TO [phone]

## 2023-01-17 ENCOUNTER — Other Ambulatory Visit: Payer: Self-pay

## 2023-01-17 NOTE — Telephone Encounter (Signed)
Received notification from [facility] regarding a prior authorization for DUPIXENT. Authorization has been APPROVED from 01/17/2023 to 01/17/2024. Approval letter sent to scan center.  Authorization # [id number]

## 2023-01-17 NOTE — Telephone Encounter (Signed)
Submitted an URGENT Prior Authorization request to [facility] for DUPIXENT via CoverMyMeds. Will update once we receive a response.  Key: [id number]

## 2023-01-24 ENCOUNTER — Other Ambulatory Visit: Payer: Self-pay | Admitting: Pulmonary Disease

## 2023-01-24 ENCOUNTER — Telehealth: Payer: Self-pay | Admitting: Pulmonary Disease

## 2023-01-24 DIAGNOSIS — J4550 Severe persistent asthma, uncomplicated: Secondary | ICD-10-CM

## 2023-01-24 MED ORDER — DUPIXENT 300 MG/2ML ~~LOC~~ SOAJ: SUBCUTANEOUS | 0 refills | Status: DC

## 2023-01-24 NOTE — Telephone Encounter (Signed)
One month of Dupixent has been sent to preferred pharmacy. Patient is aware and voiced her understanding. Received message via epic that PA is needed. Pharmacy team, please advise.

## 2023-01-24 NOTE — Telephone Encounter (Signed)
Pt states her Dupixent will run out before she sees Dr. Jayme Cloud in November. Is asking to get it extended

## 2023-01-24 NOTE — Telephone Encounter (Signed)
PA previously approved per [facility]'s note: Received notification from [facility] regarding a prior authorization for DUPIXENT. Authorization has been APPROVED from 01/17/2023 to 01/17/2024. Approval letter sent to scan center.  Authorization # [id number]

## 2023-01-27 ENCOUNTER — Telehealth: Payer: Self-pay | Admitting: Pulmonary Disease

## 2023-01-27 ENCOUNTER — Other Ambulatory Visit (HOSPITAL_COMMUNITY): Payer: Self-pay

## 2023-01-27 NOTE — Telephone Encounter (Signed)
PT states her Trelegy has gone up to over 140.00. Would like an alternative to this OR her ins company would like her to get" tier cough exception". She is in the gap.    Her # is [phone]  Please advise on providing samples until she gets situated.

## 2023-01-27 NOTE — Telephone Encounter (Signed)
Patient will have to check formulary on the medication. If Trelegy is a "preferred" item a tier exception cannot be done.

## 2023-01-27 NOTE — Telephone Encounter (Signed)
Also a tier exception may not change the cost if patient is in the coverage gap.

## 2023-01-27 NOTE — Telephone Encounter (Signed)
Lm x1 for the patient.

## 2023-01-30 NOTE — Telephone Encounter (Signed)
Patient is aware of below message/recommendations. She will contact insurance and call back with update.  Will await call back Nothing further needed at this time.

## 2023-01-30 NOTE — Telephone Encounter (Signed)
Patient is returning phone call. Patient phone number is [phone].

## 2023-01-31 ENCOUNTER — Other Ambulatory Visit (HOSPITAL_COMMUNITY): Payer: Self-pay

## 2023-02-03 ENCOUNTER — Telehealth: Payer: Self-pay | Admitting: Pulmonary Disease

## 2023-02-03 MED ORDER — FLUTICASONE FUROATE-VILANTEROL 200-25 MCG/ACT IN AEPB: 1.0000 | INHALATION_SPRAY | Freq: Every day | RESPIRATORY_TRACT | 5 refills | Status: DC

## 2023-02-03 NOTE — Telephone Encounter (Signed)
See telephone encounter from 01/27/2023. I have added this message to it.  Nothing further needed.

## 2023-02-03 NOTE — Telephone Encounter (Signed)
I have sent in the prescription to [facility] and notified the patient.  Nothing further needed.

## 2023-02-03 NOTE — Addendum Note (Signed)
Addended by: Bonney Leitz on: 02/03/2023 01:31 PM   Modules accepted: Orders

## 2023-02-03 NOTE — Telephone Encounter (Addendum)
Per Message from today(10/18).-  Patient states insurance will cover Fliticsalne and Breo Ellipta inhalers. Pharmacy is [facility] [address] [place]. Patient phone number is [phone].      Dr. Jayme Cloud, please advise.

## 2023-02-03 NOTE — Telephone Encounter (Signed)
Patient states insurance will cover Fliticsalne and Breo Ellipta inhalers. Pharmacy is [facility] [address] [place]. Patient phone number is [phone].

## 2023-02-03 NOTE — Telephone Encounter (Signed)
Lets try Breo Ellipta 200, 1 inhalation daily.  Make sure she rinses her mouth well after use.

## 2023-02-23 ENCOUNTER — Ambulatory Visit: Payer: BC Managed Care – PPO | Admitting: Pulmonary Disease

## 2023-02-23 ENCOUNTER — Encounter: Payer: Self-pay | Admitting: Pulmonary Disease

## 2023-02-23 VITALS — BP 120/62 | HR 62 | Temp 98.2°F | Ht 64.0 in | Wt 183.8 lb

## 2023-02-23 DIAGNOSIS — R7689 Other specified abnormal immunological findings in serum: Secondary | ICD-10-CM

## 2023-02-23 DIAGNOSIS — J329 Chronic sinusitis, unspecified: Secondary | ICD-10-CM | POA: Diagnosis not present

## 2023-02-23 DIAGNOSIS — J4540 Moderate persistent asthma, uncomplicated: Secondary | ICD-10-CM

## 2023-02-23 DIAGNOSIS — R0602 Shortness of breath: Secondary | ICD-10-CM | POA: Diagnosis not present

## 2023-02-23 DIAGNOSIS — R768 Other specified abnormal immunological findings in serum: Secondary | ICD-10-CM

## 2023-02-23 LAB — NITRIC OXIDE: Nitric Oxide: 15

## 2023-02-23 NOTE — Patient Instructions (Signed)
VISIT SUMMARY:  Today, we reviewed your asthma and chronic rhinosinusitis management. Your asthma is well-controlled with your current medications, and there have been no recent exacerbations. You are up-to-date with your vaccinations, and there are no new health concerns at this time.  YOUR PLAN:  -SEVERE PERSISTENT ASTHMA WITH ALLERGIC PHENOTYPE: This is a type of asthma that is long-lasting and triggered by allergies. Your asthma is stable with your current medications, Dupixent and Breo. We will continue with these medications and check your airway inflammation level again in 6 months.  -CHRONIC RHINOSINUSITIS: This is a long-term inflammation of the sinuses. There are no new issues, so we will continue with your current management plan. Dupixent also helps with this.  -GENERAL HEALTH MAINTENANCE: You are up-to-date on your vaccinations, including influenza and RSV. Continue with your current health practices and follow up in 6 months unless any issues arise.  INSTRUCTIONS:  Please continue with your current medications and management plans. We will check your airway inflammation level again in 6 months. Follow up in 6 months unless any issues arise.

## 2023-02-23 NOTE — Progress Notes (Signed)
Subjective:    Patient ID: Hayley Howe, female    DOB: 12/05/1945, 77 y.o.   MRN: [id number]  Patient Care Team: Alba Cory, MD as PCP - General (Family Medicine) Cherre Huger, DC as Consulting Physician (Chiropractic Medicine) Linus Salmons, MD as Consulting Physician (Otolaryngology) Wyline Mood, MD as Consulting Physician (Gastroenterology) Salena Saner, MD as Consulting Physician (Pulmonary Disease) Lyndle Herrlich, MD as Consulting Physician (Orthopedic Surgery)  Chief Complaint  Patient presents with   Follow-up    No cough, shortness of breath or wheezing.    BACKGROUND/INTERVAL:Hayley Howe is a 77 year old very remote former smoker with minimal tobacco exposure, who presents for follow-up on the issue of severe persistent asthma,allergic phenotype,and chronic rhinosinusitis.  This is a scheduled visit.  She was last seen on 17 March 2022 by me.  At that time she was noted to be well-controlled on Dupixent,Trelegy and as needed albuterol. She started Dupixent on March 2023, she has been tolerating it well.   HPI Discussed the use of AI scribe software for clinical note transcription with the patient, who gave verbal consent to proceed.  History of Present Illness   Hayley Howe, a 77 year old with a history of severe persistent asthma with allergic phenotype and chronic rhinosinusitis, reports good control of asthma symptoms. She is currently on Dupixent, which she self-administers via injection. She has not experienced any recent exacerbations of her asthma and has not required any additional refills of her inhalers, including Breo.  She has a history of smoking, but this is remote. She has received her annual flu shot and was also vaccinated against RSV last November. She has not reported any new symptoms or changes in her health status.  Her primary care provider is Dr. Carlynn Purl, who has been managing her overall health and ensuring she is up-to-date with her  vaccinations. She has not reported any new medications or changes in her current regimen.  Her asthma control appears to be well-managed with her current treatment regimen, and she has not reported any adverse effects from her medications. She has not required any changes to her treatment plan due to exacerbations or poor control of her symptoms.  Her nitric oxide was checked and found to be low, indicating good control of her asthma with her current treatment regimen. She has not reported any new or worsening symptoms since her last visit.     TEST/EVENTS : PFTs on February 28, 2019 showed normal lung function with an FEV1 at 87%, ratio 72, FVC 94%, no significant bronchodilator response CT chest November 2020 mild bronchial wall thickening with bronchial secretions and associated scarring and linear atelectasis in the right middle lobe, left upper lobe and lingula.  Otherwise clear with no evidence of interstitial lung disease, findings of healed granulomatous disease in the right lower lobe CT sinus April 08, 2019 extensive acute on chronic sinusitis, air-fluid levels in the maxillary sinuses, minimal improvement and left frontal sinus and scattered ethmoid air cells Allergen panel, January 2023 absolute eosinophil count 500, IgE 545, allergen panel negative except for Cladosporium Herbarum IgE (common mold)  07/14/2022 PFTs: FEV1 1.97 L or 91% predicted, FVC 2.38 L or 83% predicted, FEV1/FVC 83%, no significant bronchodilator response. Diffusion capacity normal.  Compared to PFTs from 2020, no significant change.  Review of Systems A 10 point review of systems was performed and it is as noted above otherwise negative.   Patient Active Problem List   Diagnosis Date Noted   Sebaceous cyst 09/08/2022  Severe persistent asthma without complication 07/14/2022   History of total hip arthroplasty 01/05/2022   Primary osteoarthritis of right hip 12/21/2021   Anemia 12/21/2021   HTN  (hypertension) 08/06/2021   HLD (hyperlipidemia) 08/06/2021   Kidney lesion 08/06/2021   Chronic rhinitis 07/02/2021   Senile purpura (HCC) 05/14/2020   Moderate persistent asthma with allergic rhinitis without complication 05/14/2020   Pre-diabetes 05/14/2020   Hyperglycemia 07/13/2017   Chronic constipation 12/15/2016   Primary osteoarthritis of left hip 11/05/2015   Scoliosis 11/05/2015   Chronic right-sided low back pain with right-sided sciatica 10/30/2014   Benign essential HTN 10/27/2014   Bradycardia 10/27/2014   Chronic insomnia 10/27/2014   Headache, temporal 10/27/2014   Hypercholesteremia 10/27/2014   Obesity (BMI 30-39.9) 10/27/2014   Changing sleep-work schedule, affecting sleep 10/27/2014   Vitamin D deficiency 10/27/2014   Arthritis of knee, degenerative 09/24/2009    Social History   Tobacco Use   Smoking status: Former    Current packs/day: 0.00    Average packs/day: 0.3 packs/day for 9.7 years (2.4 ttl pk-yrs)    Types: Cigarettes    Start date: 08/13/1967    Quit date: 04/18/1977    Years since quitting: 45.8    Passive exposure: Past   Smokeless tobacco: Never   Tobacco comments:    smoking cessation materials not required  Substance Use Topics   Alcohol use: No    Alcohol/week: 0.0 standard drinks of alcohol    No Known Allergies  Current Meds  Medication Sig   albuterol (VENTOLIN HFA) 108 (90 Base) MCG/ACT inhaler Inhale 2 puffs into the lungs every 6 (six) hours as needed for wheezing or shortness of breath.   amLODipine (NORVASC) 10 MG tablet Take 1 tablet (10 mg total) by mouth daily.   aspirin EC 81 MG tablet Take 1 tablet (81 mg total) by mouth daily.   Azelastine-Fluticasone 137-50 MCG/ACT SUSP Place 1 spray into both nostrils 2 (two) times daily.   celecoxib (CELEBREX) 200 MG capsule Take 1 capsule by mouth daily as needed.   Cholecalciferol (VITAMIN D) 2000 UNITS tablet Take 1 tablet by mouth daily.   Dupilumab (DUPIXENT) 300 MG/2ML SOPN  INJECT 1 PEN SUBCUTANEOUSLY EVERY TWO WEEKS   fluticasone furoate-vilanterol (BREO ELLIPTA) 200-25 MCG/ACT AEPB Inhale 1 puff into the lungs daily.   loratadine (CLARITIN) 10 MG tablet Take 1 tablet (10 mg total) by mouth daily.   Magnesium 250 MG TABS Take 1 tablet by mouth daily.   methocarbamol (ROBAXIN) 500 MG tablet Take 1 tablet (500 mg total) by mouth every evening.   Multiple Vitamin (MULTIVITAMIN ADULT PO) Take by mouth.   rosuvastatin (CRESTOR) 10 MG tablet Take 1 tablet (10 mg total) by mouth daily.   tolterodine (DETROL LA) 2 MG 24 hr capsule Take 1 capsule (2 mg total) by mouth daily.   traMADol (ULTRAM) 50 MG tablet Take 1 tablet (50 mg total) by mouth every 12 (twelve) hours as needed for severe pain (back pain).   valsartan-hydrochlorothiazide (DIOVAN-HCT) 160-12.5 MG tablet Take 1 tablet by mouth daily.   vitamin C (ASCORBIC ACID) 500 MG tablet Take 1 tablet by mouth daily.   zolpidem (AMBIEN CR) 12.5 MG CR tablet Take 1 tablet (12.5 mg total) by mouth at bedtime.    Immunization History  Administered Date(s) Administered   Fluad Quad(high Dose 65+) 12/13/2018, 01/09/2020, 12/24/2020, 12/30/2022   Influenza, High Dose Seasonal PF 01/20/2016, 12/15/2016, 01/19/2018, 12/17/2021   Influenza, Seasonal, Injecte, Preservative Fre 02/18/2010, 02/11/2011, 03/29/2012  Influenza,inj,Quad PF,6+ Mos 01/03/2013, 01/10/2014, 03/05/2015   Influenza-Unspecified 12/17/2013   Moderna SARS-COV2 Booster Vaccination 02/17/2021   Moderna Sars-Covid-2 Vaccination 05/26/2019, 06/26/2019, 02/20/2020, 10/15/2020   Pneumococcal Conjugate-13 06/19/2014   Pneumococcal Polysaccharide-23 02/18/2010, 07/02/2015   Rsv, Bivalent, Protein Subunit Rsvpref,pf Verdis Frederickson) 03/17/2022   Td 08/12/2016   Tdap 09/14/2007   Zoster Recombinant(Shingrix) 06/03/2021, 08/05/2021   Zoster, Live 08/20/2015      Objective:   BP 120/62 (BP Location: Left Arm, Patient Position: Sitting, Cuff Size: Normal)   Pulse 62    Temp 98.2 F (36.8 C) (Temporal)   Ht 5\' 4"  (1.626 m)   Wt 183 lb 12.8 oz (83.4 kg)   SpO2 98%   BMI 31.55 kg/m   SpO2: 98 %  GENERAL: Well-developed, overweight woman in no acute distress, well groomed.  Fully ambulatory.  No conversational dyspnea.  Speech clear. HEAD: Normocephalic, atraumatic.  Nasal quality to speech. EYES: Pupils equal, round, reactive to light.  No scleral icterus.  MOUTH: Poor dentition, some missing teeth, oral mucosa moist.  No thrush.   NECK: Supple. No thyromegaly. Trachea midline. No JVD.  No adenopathy. PULMONARY: Good air entry bilaterally.  No adventitious sounds. CARDIOVASCULAR: S1 and S2. Regular rate and rhythm.  Grade 1/6 systolic ejection murmur left sternal border. No gallops or rubs.   GASTROINTESTINAL: Benign. MUSCULOSKELETAL: No joint deformity, no clubbing, no edema.  NEUROLOGIC: No focal deficit, no gait disturbance, speech is fluent.   SKIN: Intact,warm,dry.  No overt rashes noted. PSYCH: Mood and behavior appropriate  Lab Results  Component Value Date   NITRICOXIDE 15 02/23/2023    Assessment & Plan:     ICD-10-CM   1. Moderate persistent allergic asthma  J45.40 Nitric oxide    2. Chronic rhinosinusitis  J32.9     3. Elevated IgE level  R76.8       Orders Placed This Encounter  Procedures   Nitric oxide   Discussion:    Severe Persistent Asthma with Allergic Phenotype Stable on Dupixent and Breo. Nitirc oxide level is low, indicating good control. -Continue Dupixent and Breo. -Asthma action plan in place. -Patient has rescue inhaler. -Follow up in 6 months.  Chronic Rhinosinusitis No acute issues noted. -Continue current management.  General Health Maintenance Up to date on vaccinations including influenza and RSV. -Follow-up in 6 months unless issues arise.      Gailen Shelter, MD Advanced Bronchoscopy PCCM [facility]    *This note was generated using voice recognition  software/Dragon and/or AI transcription program.  Despite best efforts to proofread, errors can occur which can change the meaning. Any transcriptional errors that result from this process are unintentional and may not be fully corrected at the time of dictation.

## 2023-03-13 ENCOUNTER — Other Ambulatory Visit: Payer: Self-pay | Admitting: Pulmonary Disease

## 2023-03-13 DIAGNOSIS — J4550 Severe persistent asthma, uncomplicated: Secondary | ICD-10-CM

## 2023-03-13 NOTE — Telephone Encounter (Signed)
Out of refills for dupixent

## 2023-03-15 NOTE — Progress Notes (Signed)
Name: Hayley Howe   MRN: [id number]    DOB: 29-Oct-1945   Date:03/22/2023       Progress Note  Subjective  Chief Complaint  Chief Complaint  Patient presents with   Medical Management of Chronic Issues    HPI  Discussed the use of AI scribe software for clinical note transcription with the patient, who gave verbal consent to proceed.  History of Present Illness   The patient, with a history of chronic back pain,  had a flare in Sep 2024 that was severe enough to elevate her blood pressure. The pain, typically localized to the right side of the back, with intermittent right lower extremity radiculitis. She takes tramadol and methocarbamol prn  Currently, the patient reports persistent back pain, predominantly on the right side, but denies any radiation down the leg. A new symptom of pain in the left buttock has emerged, which the patient suspects may be related to her sciatic nerve. The patient has been taking the prescribed tramadol and methocarbamol intermittently to manage the pain.  The patient also has a history of chronic insomnia, for which she takes Ambien. She has been on this medication for a long time, at a dose higher than typically indicated but aware of possible side effects  In addition to these issues, the patient has been experiencing urge incontinence. She has been taking Detrol LA, which has reportedly improved her symptoms by approximately 50%.  The patient also has a history of moderate persistent asthma with allergic rhinitis, for which she takes Breo, Ventolin, and Dupixent. She reports no current issues with wheezing, cough, or shortness of breath.  The patient has been diagnosed with prediabetes, with a slightly elevated A1C. She has been advised to cut down on sweets and carbohydrates to manage this condition.  Lastly, the patient has been experiencing numbness in both hands, which has been ongoing but not severe, she has not discussed with Ortho yet,   suggested NCS but she will think about it. .         Patient Active Problem List   Diagnosis Date Noted   Sebaceous cyst 09/08/2022   Severe persistent asthma without complication 07/14/2022   History of total hip arthroplasty 01/05/2022   Primary osteoarthritis of right hip 12/21/2021   Anemia 12/21/2021   HTN (hypertension) 08/06/2021   HLD (hyperlipidemia) 08/06/2021   Kidney lesion 08/06/2021   Chronic rhinitis 07/02/2021   Senile purpura (HCC) 05/14/2020   Moderate persistent asthma with allergic rhinitis without complication 05/14/2020   Pre-diabetes 05/14/2020   Hyperglycemia 07/13/2017   Chronic constipation 12/15/2016   Primary osteoarthritis of left hip 11/05/2015   Scoliosis 11/05/2015   Chronic right-sided low back pain with right-sided sciatica 10/30/2014   Benign essential HTN 10/27/2014   Bradycardia 10/27/2014   Chronic insomnia 10/27/2014   Headache, temporal 10/27/2014   Hypercholesteremia 10/27/2014   Obesity (BMI 30-39.9) 10/27/2014   Changing sleep-work schedule, affecting sleep 10/27/2014   Vitamin D deficiency 10/27/2014   Arthritis of knee, degenerative 09/24/2009    Past Surgical History:  Procedure Laterality Date   ABDOMINAL HYSTERECTOMY     BREAST BIOPSY Right 01/21/2013   stereo - benign   COLONOSCOPY WITH PROPOFOL N/A 06/30/2016   Procedure: COLONOSCOPY WITH PROPOFOL;  Surgeon: Wyline Mood, MD;  Location: [facility] ENDOSCOPY;  Service: Endoscopy;  Laterality: N/A;   COLONOSCOPY WITH PROPOFOL N/A 09/02/2021   Procedure: COLONOSCOPY WITH PROPOFOL;  Surgeon: Wyline Mood, MD;  Location: [facility] ENDOSCOPY;  Service: Gastroenterology;  Laterality:  N/A;   KNEE SURGERY Left 2012   Dr. Rosita Kea   OOPHORECTOMY     TUBAL LIGATION     VAGINAL PROLAPSE REPAIR      Family History  Problem Relation Age of Onset   Breast cancer Sister 58       in year 2013   Hyperlipidemia Mother    Diabetes Mother    Cancer Father        Leukemia   Hyperlipidemia Father     Lung cancer Brother    Breast cancer Sister    Heart failure Brother     Social History   Tobacco Use   Smoking status: Former    Current packs/day: 0.00    Average packs/day: 0.3 packs/day for 9.7 years (2.4 ttl pk-yrs)    Types: Cigarettes    Start date: 08/13/1967    Quit date: 04/18/1977    Years since quitting: 45.9    Passive exposure: Past   Smokeless tobacco: Never   Tobacco comments:    smoking cessation materials not required  Substance Use Topics   Alcohol use: No    Alcohol/week: 0.0 standard drinks of alcohol     Current Outpatient Medications:    albuterol (VENTOLIN HFA) 108 (90 Base) MCG/ACT inhaler, Inhale 2 puffs into the lungs every 6 (six) hours as needed for wheezing or shortness of breath., Disp: 8 g, Rfl: 6   aspirin EC 81 MG tablet, Take 1 tablet (81 mg total) by mouth daily., Disp: 30 tablet, Rfl: 0   Cholecalciferol (VITAMIN D) 2000 UNITS tablet, Take 1 tablet by mouth daily., Disp: , Rfl:    DUPIXENT 300 MG/2ML SOAJ, INJECT 1 PEN SUBCUTANEOUSLY EVERY TWO WEEKS, Disp: 4 mL, Rfl: 0   fluticasone furoate-vilanterol (BREO ELLIPTA) 200-25 MCG/ACT AEPB, Inhale 1 puff into the lungs daily., Disp: 60 each, Rfl: 5   loratadine (CLARITIN) 10 MG tablet, Take 1 tablet (10 mg total) by mouth daily., Disp: 30 tablet, Rfl: 0   Magnesium 250 MG TABS, Take 1 tablet by mouth daily., Disp: , Rfl:    methocarbamol (ROBAXIN) 500 MG tablet, Take 1 tablet (500 mg total) by mouth every evening., Disp: 90 tablet, Rfl: 0   Multiple Vitamin (MULTIVITAMIN ADULT PO), Take by mouth., Disp: , Rfl:    pregabalin (LYRICA) 25 MG capsule, Take 1-4 capsules (25-100 mg total) by mouth every evening., Disp: 90 capsule, Rfl: 0   rosuvastatin (CRESTOR) 10 MG tablet, Take 1 tablet (10 mg total) by mouth daily., Disp: 90 tablet, Rfl: 1   tolterodine (DETROL LA) 2 MG 24 hr capsule, Take 1 capsule (2 mg total) by mouth daily., Disp: 90 capsule, Rfl: 1   traMADol (ULTRAM) 50 MG tablet, Take 1 tablet  (50 mg total) by mouth every 12 (twelve) hours as needed for severe pain (back pain)., Disp: 90 tablet, Rfl: 0   vitamin C (ASCORBIC ACID) 500 MG tablet, Take 1 tablet by mouth daily., Disp: , Rfl:    amLODipine (NORVASC) 10 MG tablet, Take 1 tablet (10 mg total) by mouth daily., Disp: 90 tablet, Rfl: 1   Azelastine-Fluticasone 137-50 MCG/ACT SUSP, Place 1 spray into both nostrils 2 (two) times daily., Disp: 69 g, Rfl: 1   valsartan-hydrochlorothiazide (DIOVAN-HCT) 160-12.5 MG tablet, Take 1 tablet by mouth daily., Disp: 90 tablet, Rfl: 1   zolpidem (AMBIEN CR) 12.5 MG CR tablet, Take 1 tablet (12.5 mg total) by mouth at bedtime., Disp: 90 tablet, Rfl: 0  No Known Allergies  I personally  reviewed active problem list, medication list, allergies, family history with the patient/caregiver today.   ROS  Ten systems reviewed and is negative except as mentioned in HPI    Objective  Vitals:   03/22/23 1034  BP: 126/64  Pulse: 89  Resp: 16  Temp: 97.9 F (36.6 C)  TempSrc: Oral  SpO2: 98%  Weight: 182 lb 1.6 oz (82.6 kg)  Height: 5\' 4"  (1.626 m)    Body mass index is 31.26 kg/m.  Physical Exam  Constitutional: Patient appears well-developed and well-nourished. Obese  No distress.  HEENT: head atraumatic, normocephalic, pupils equal and reactive to light, neck supple Cardiovascular: Normal rate, regular rhythm . 2 plus SEM opening click . No BLE edema. Pulmonary/Chest: Effort normal and breath sounds normal. No respiratory distress. Muscular Skeletal: pain during palpation of left buttocks, scoliosis , using a cane to assist on ambulation, normal grip Abdominal: Soft.  There is no tenderness. Psychiatric: Patient has a normal mood and affect. behavior is normal. Judgment and thought content normal.   Recent Results (from the past 2160 hour(s))  BASIC METABOLIC PANEL WITH GFR     Status: None   Collection Time: 12/30/22  8:51 AM  Result Value Ref Range   Glucose, Bld 89 65 - 99  mg/dL    Comment: .            Fasting reference interval .    BUN 17 7 - 25 mg/dL   Creat 6.21 3.08 - 6.57 mg/dL   eGFR 64 > OR = 60 QI/ONG/2.95M8   BUN/Creatinine Ratio SEE NOTE: 6 - 22 (calc)    Comment:    Not Reported: BUN and Creatinine are within    reference range. .    Sodium 139 135 - 146 mmol/L   Potassium 4.0 3.5 - 5.3 mmol/L   Chloride 102 98 - 110 mmol/L   CO2 28 20 - 32 mmol/L   Calcium 9.5 8.6 - 10.4 mg/dL  Hemoglobin U1L     Status: Abnormal   Collection Time: 12/30/22  8:51 AM  Result Value Ref Range   Hgb A1c MFr Bld 6.1 (H) <5.7 % of total Hgb    Comment: For someone without known diabetes, a hemoglobin  A1c value between 5.7% and 6.4% is consistent with prediabetes and should be confirmed with a  follow-up test. . For someone with known diabetes, a value <7% indicates that their diabetes is well controlled. A1c targets should be individualized based on duration of diabetes, age, comorbid conditions, and other considerations. . This assay result is consistent with an increased risk of diabetes. . Currently, no consensus exists regarding use of hemoglobin A1c for diagnosis of diabetes for children. .    Mean Plasma Glucose 128 mg/dL   eAG (mmol/L) 7.1 mmol/L    Comment: . This test was performed on the Roche cobas c503 platform. Effective 01/24/22, a change in test platforms from the Abbott Architect to the Roche cobas c503 may have shifted HbA1c results compared to historical results. Based on laboratory validation testing conducted at [facility], the Roche platform relative to the Abbott platform had an average increase in HbA1c value of < or = 0.3%. This difference is within accepted  variability established by the [facility]. Note that not all individuals will have had a shift in their results and direct comparisons between historical and current results for testing conducted on different platforms is  not recommended.   Advanced Written Notification [facility]) Test Refusal  Status: None   Collection Time: 12/30/22  8:51 AM  Result Value Ref Range   RAM1      Comment: . Be advised that your patient has indicated on the advance written notice their decision not to receive the following laboratory tests. As a result, the tests will not be performed.    [facility] TEST REFUSED VITAMIN B12/ FOLATE   Nitric oxide     Status: None   Collection Time: 02/23/23 10:01 AM  Result Value Ref Range   Nitric Oxide 15      PHQ2/9:    03/22/2023   10:33 AM 12/30/2022    7:59 AM 08/30/2022    7:38 AM 04/29/2022    7:41 AM 04/25/2022    8:22 AM  Depression screen PHQ 2/9  Decreased Interest 0 0 0 0 0  Down, Depressed, Hopeless 0 0 0 0 0  PHQ - 2 Score 0 0 0 0 0  Altered sleeping 0 0 0 0 0  Tired, decreased energy 0 0 0 0 0  Change in appetite 0 0 0 0 0  Feeling bad or failure about yourself  0 0 0 0 0  Trouble concentrating 0 0 0 0 0  Moving slowly or fidgety/restless 0 0 0 0 0  Suicidal thoughts 0 0 0 0 0  PHQ-9 Score 0 0 0 0 0  Difficult doing work/chores Not difficult at all    Not difficult at all    phq 9 is negative   Fall Risk:    03/22/2023   10:33 AM 12/30/2022    7:58 AM 08/30/2022    7:38 AM 04/29/2022    7:41 AM 04/25/2022    8:22 AM  Fall Risk   Falls in the past year? 0 0 0 0 0  Number falls in past yr: 0 0 0 0 0  Injury with Fall? 0 0 0 0 0  Risk for fall due to : No Fall Risks No Fall Risks No Fall Risks No Fall Risks   Follow up Falls prevention discussed;Education provided;Falls evaluation completed Falls prevention discussed Falls prevention discussed Falls prevention discussed     Functional Status Survey: Is the patient deaf or have difficulty hearing?: No Does the patient have difficulty seeing, even when wearing glasses/contacts?: No Does the patient have difficulty concentrating, remembering, or making decisions?: No Does the patient have difficulty walking or  climbing stairs?: Yes Does the patient have difficulty dressing or bathing?: No Does the patient have difficulty doing errands alone such as visiting a doctor's office or shopping?: No    Assessment & Plan  Assessment and Plan    Chronic Back Pain Persistent right-sided back pain with new onset left buttock pain. No radicular symptoms. Previously managed with Tramadol and Methocarbamol. -Start Pregabalin 25mg  at night, titrate up to 100mg  as tolerated. -Continue Methocarbamol as needed. -Consider physical therapy or chiropractic care.  Insomnia Chronic insomnia managed with Ambien. -Monitor for increased sedation with initiation of Pregabalin. -Consider reducing Ambien dose if excessive sedation occurs.  Urge Incontinence Improvement noted with Detrol LA. -Continue Detrol LA at current dose.  Asthma and Allergic Rhinitis Well controlled on current regimen of Breo, Loratadine, Enbrel, Azelastine, Fluticasone, and Ventolin. -Refill Azelastine and Fluticasone.  Arthritis Managed with Tylenol as needed.  Anemia Chronic anemia, likely anemia of chronic disease. Iron studies normal. -Continue monitoring. -Encourage multivitamin use and healthy diet.  Prediabetes A1C slightly above normal. -Encourage dietary modifications to reduce carbohydrate intake.  Heart Murmur Systolic murmur  with opening clicks noted on exam. No prior echocardiogram on record. -Order echocardiogram to assess for structural heart disease.  General Health Maintenance -Encourage patient to schedule mammogram. -Continue current medications including Valsartan HCTZ for hypertension, Rosuvastatin for hyperlipidemia, and Aspirin. -Check back in three months or sooner if issues with Pregabalin arise.   HTN -continue bp medications, bp is controlled today    Paresthesia hands - possible carpal tunnel, discussed braces while sleeping and consider NCS. Lyrica may also help with symptoms

## 2023-03-17 ENCOUNTER — Other Ambulatory Visit: Payer: Self-pay

## 2023-03-22 ENCOUNTER — Encounter: Payer: Self-pay | Admitting: Family Medicine

## 2023-03-22 ENCOUNTER — Ambulatory Visit (INDEPENDENT_AMBULATORY_CARE_PROVIDER_SITE_OTHER): Payer: BC Managed Care – PPO | Admitting: Family Medicine

## 2023-03-22 VITALS — BP 126/64 | HR 89 | Temp 97.9°F | Resp 16 | Ht 64.0 in | Wt 182.1 lb

## 2023-03-22 DIAGNOSIS — R011 Cardiac murmur, unspecified: Secondary | ICD-10-CM

## 2023-03-22 DIAGNOSIS — M170 Bilateral primary osteoarthritis of knee: Secondary | ICD-10-CM

## 2023-03-22 DIAGNOSIS — M5441 Lumbago with sciatica, right side: Secondary | ICD-10-CM

## 2023-03-22 DIAGNOSIS — G8929 Other chronic pain: Secondary | ICD-10-CM

## 2023-03-22 DIAGNOSIS — J4540 Moderate persistent asthma, uncomplicated: Secondary | ICD-10-CM

## 2023-03-22 DIAGNOSIS — F5104 Psychophysiologic insomnia: Secondary | ICD-10-CM

## 2023-03-22 DIAGNOSIS — D692 Other nonthrombocytopenic purpura: Secondary | ICD-10-CM | POA: Diagnosis not present

## 2023-03-22 DIAGNOSIS — J3089 Other allergic rhinitis: Secondary | ICD-10-CM

## 2023-03-22 DIAGNOSIS — J302 Other seasonal allergic rhinitis: Secondary | ICD-10-CM

## 2023-03-22 DIAGNOSIS — I10 Essential (primary) hypertension: Secondary | ICD-10-CM

## 2023-03-22 DIAGNOSIS — R202 Paresthesia of skin: Secondary | ICD-10-CM

## 2023-03-22 MED ORDER — ZOLPIDEM TARTRATE ER 12.5 MG PO TBCR
12.5000 mg | EXTENDED_RELEASE_TABLET | Freq: Every day | ORAL | 0 refills | Status: DC
Start: 2023-03-22 — End: 2023-06-22

## 2023-03-22 MED ORDER — AMLODIPINE BESYLATE 10 MG PO TABS
10.0000 mg | ORAL_TABLET | Freq: Every day | ORAL | 1 refills | Status: DC
Start: 2023-03-22 — End: 2023-10-27

## 2023-03-22 MED ORDER — AZELASTINE-FLUTICASONE 137-50 MCG/ACT NA SUSP: 1.0000 | Freq: Two times a day (BID) | NASAL | 1 refills | Status: DC

## 2023-03-22 MED ORDER — VALSARTAN-HYDROCHLOROTHIAZIDE 160-12.5 MG PO TABS
1.0000 | ORAL_TABLET | Freq: Every day | ORAL | 1 refills | Status: DC
Start: 2023-03-22 — End: 2023-10-27

## 2023-03-22 MED ORDER — PREGABALIN 25 MG PO CAPS
25.0000 mg | ORAL_CAPSULE | Freq: Every evening | ORAL | 0 refills | Status: DC
Start: 2023-03-22 — End: 2023-04-26

## 2023-04-06 ENCOUNTER — Ambulatory Visit: Payer: BC Managed Care – PPO | Admitting: Family Medicine

## 2023-04-06 ENCOUNTER — Other Ambulatory Visit: Payer: Self-pay | Admitting: Pharmacist

## 2023-04-06 DIAGNOSIS — J4550 Severe persistent asthma, uncomplicated: Secondary | ICD-10-CM

## 2023-04-06 MED ORDER — DUPIXENT 300 MG/2ML ~~LOC~~ SOAJ: SUBCUTANEOUS | 1 refills | Status: DC

## 2023-04-06 NOTE — Telephone Encounter (Signed)
Refill sent for DUPIXENT to [facility]: [phone]  Dose: 300mg  SQ every 2 weeks  Last OV: 02/23/2023 Provider: Dr. Jayme Cloud Next OV: 6 months (not yet scheduled)  Routing to scheduling team for follow-up on appt scheduling  Chesley Mires, PharmD, MPH, BCPS Clinical Pharmacist (Rheumatology and Pulmonology)

## 2023-04-21 ENCOUNTER — Other Ambulatory Visit: Payer: Self-pay | Admitting: Family Medicine

## 2023-04-21 DIAGNOSIS — E7800 Pure hypercholesterolemia, unspecified: Secondary | ICD-10-CM

## 2023-04-24 ENCOUNTER — Ambulatory Visit: Admission: RE | Admit: 2023-04-24 | Payer: BC Managed Care – PPO | Source: Ambulatory Visit

## 2023-04-25 ENCOUNTER — Ambulatory Visit: Payer: Self-pay

## 2023-04-25 NOTE — Telephone Encounter (Signed)
Called pt: no answer. LVMTCB.

## 2023-04-25 NOTE — Telephone Encounter (Signed)
 Chief Complaint: Right side Pain  Symptoms: right buttocks pain that shots down the right leg Frequency: constant  Pertinent Negatives: Patient denies injury, nausea, vomiting, numbness, weakness Disposition: [] ED /[] Urgent Care (no appt availability in office) / [] Appointment(In office/virtual)/ []  [facility]/ [] Home Care/ [] Refused Recommended Disposition /[] [facility]/ [x]  Follow-up with PCP Additional Notes: Patient states she continues to have persistent right-sided back, buttocks, and leg pain. Patient stated she started taking the Pregabalin  25 mg and has titrated up to 100 MG. Patient states she takes it in the evening but she works at night. She starts to have throughout her shift. Patient states it hurts more when she is sitting. Care advice was given and patient stated that she was advised to report if the Pregabalin  100mg  did not improve her symptoms. Patient is requesting a stronger dose of Pregabalin  if PCP recommends it sent to [facility] on [address] In [place].  Reason for Disposition  Leg pain or muscle cramp is a chronic symptom (recurrent or ongoing AND present > 4 weeks)  Answer Assessment - Initial Assessment Questions 1. ONSET: When did the pain start?      A few weeks ago  2. LOCATION: Where is the pain located?      Left side  3. PAIN: How bad is the pain?    (Scale 1-10; or mild, moderate, severe)   -  MILD (1-3): doesn't interfere with normal activities    -  MODERATE (4-7): interferes with normal activities (e.g., work or school) or awakens from sleep, limping    -  SEVERE (8-10): excruciating pain, unable to do any normal activities, unable to walk     8/10 4. WORK OR EXERCISE: Has there been any recent work or exercise that involved this part of the body?      No  5. CAUSE: What do you think is causing the leg pain?     Sciatica pain  6. OTHER SYMPTOMS: Do you have any other symptoms? (e.g., chest pain, back pain,  breathing difficulty, swelling, rash, fever, numbness, weakness)     Shots up to buttocks  Protocols used: Leg Pain-A-AH

## 2023-04-26 ENCOUNTER — Other Ambulatory Visit: Payer: Self-pay | Admitting: Family Medicine

## 2023-04-26 MED ORDER — PREGABALIN 100 MG PO CAPS: 100.0000 mg | ORAL_CAPSULE | Freq: Three times a day (TID) | ORAL | 0 refills | Status: DC

## 2023-04-26 NOTE — Telephone Encounter (Signed)
 Pt is interested in 100 mg TID can you send new prescription for her? Per pt she takes it before she goes to sleep unable to tell if it makes her sedated.

## 2023-04-26 NOTE — Telephone Encounter (Signed)
No answer from pt left detailed vm. °

## 2023-05-12 ENCOUNTER — Ambulatory Visit: Payer: Self-pay

## 2023-05-12 NOTE — Telephone Encounter (Signed)
New triage from different encounter transferred since same problem.   Patient called, left VM to return the call to the office to speak to the NT.   Summary: med ?   Pt called in , wants to know if she can take a gabapetin at work when her leg starts to hurt

## 2023-05-12 NOTE — Telephone Encounter (Signed)
Patient called, left VM to return the call to the office to speak to the NT.   Summary: Sciatic pain   Patient states that Dr. Carlynn Purl started her on pregabalin (LYRICA) 100 MG capsule 2 weeks ago for sciatic pain. Patient states it has not been helping and is still having a lot of pain.

## 2023-05-12 NOTE — Telephone Encounter (Signed)
  Chief Complaint: Back pain - sciatic pain - medications not effective Symptoms: pain Frequency: ongoing Pertinent Negatives: Patient denies neurological deficits Disposition: [] ED /[] Urgent Care (no appt availability in office) / [x] Appointment(In office/virtual)/ []  [facility]/ [] Home Care/ [] Refused Recommended Disposition /[] [facility]/ []  Follow-up with PCP Additional Notes: Pt returned our call. She is continuing to have back pain even with Lyrica at 100mg  3 times daily. She is also taking tramadol.  Pt made appt for next week. Pt is wondering if there are any referrals that may be helpful that provider can start to obtain prior to OV.  Or if another pain medication might be effective. Please advise.  Summary: Sciatic pain   Patient states that Dr. Carlynn Purl started her on pregabalin (LYRICA) 100 MG capsule 2 weeks ago for sciatic pain. Patient states it has not been helping and is still having a lot of pain.     Reason for Disposition  Back pain is a chronic symptom (recurrent or ongoing AND present > 4 weeks)  Answer Assessment - Initial Assessment Questions 1. ONSET: "When did the pain begin?"      Ongoing 2. LOCATION: "Where does it hurt?" (upper, mid or lower back)     Lower back and sciatic pain 3. SEVERITY: "How bad is the pain?"  (e.g., Scale 1-10; mild, moderate, or severe)   - MILD (1-3): Doesn't interfere with normal activities.    - MODERATE (4-7): Interferes with normal activities or awakens from sleep.    - SEVERE (8-10): Excruciating pain, unable to do any normal activities.      severe 4. PATTERN: "Is the pain constant?" (e.g., yes, no; constant, intermittent)      yes 5. RADIATION: "Does the pain shoot into your legs or somewhere else?"     Into legs 6. CAUSE:  "What do you think is causing the back pain?"      Sciatic pain  8. MEDICINES: "What have you taken so far for the pain?" (e.g., nothing, acetaminophen, NSAIDS)     Tramadol,  Lyrica 9. NEUROLOGIC SYMPTOMS: "Do you have any weakness, numbness, or problems with bowel/bladder control?"     no  Protocols used: Back Pain-A-AH

## 2023-05-12 NOTE — Telephone Encounter (Signed)
2nd attempt, Patient called, left VM to return the call to the office to speak to the NT.

## 2023-05-12 NOTE — Telephone Encounter (Signed)
This encounter was created in error - please disregard.

## 2023-05-15 NOTE — Telephone Encounter (Signed)
Pt notified, pt states pregabalin is not working with releasing her pain. She has an appt with you on Wed, wants different mediation FYI

## 2023-05-17 ENCOUNTER — Ambulatory Visit (INDEPENDENT_AMBULATORY_CARE_PROVIDER_SITE_OTHER): Payer: BC Managed Care – PPO | Admitting: Family Medicine

## 2023-05-17 ENCOUNTER — Encounter: Payer: Self-pay | Admitting: Family Medicine

## 2023-05-17 VITALS — BP 136/82 | HR 71 | Temp 97.4°F | Resp 16 | Ht 64.0 in | Wt 186.9 lb

## 2023-05-17 DIAGNOSIS — G8929 Other chronic pain: Secondary | ICD-10-CM

## 2023-05-17 DIAGNOSIS — M5442 Lumbago with sciatica, left side: Secondary | ICD-10-CM | POA: Diagnosis not present

## 2023-05-17 DIAGNOSIS — N2889 Other specified disorders of kidney and ureter: Secondary | ICD-10-CM

## 2023-05-17 DIAGNOSIS — N289 Disorder of kidney and ureter, unspecified: Secondary | ICD-10-CM | POA: Diagnosis not present

## 2023-05-17 MED ORDER — TRAMADOL HCL 50 MG PO TABS: 50.0000 mg | ORAL_TABLET | Freq: Three times a day (TID) | ORAL | 0 refills | Status: DC

## 2023-05-17 MED ORDER — METHYLPREDNISOLONE 4 MG PO TBPK: ORAL_TABLET | ORAL | 0 refills | Status: DC

## 2023-05-17 MED ORDER — PREGABALIN 300 MG PO CAPS: 300.0000 mg | ORAL_CAPSULE | Freq: Every evening | ORAL | 0 refills | Status: DC | PRN

## 2023-05-17 NOTE — Progress Notes (Signed)
Name: Hayley Howe   MRN: [id number]    DOB: 1945-09-12   Date:05/17/2023       Progress Note  Subjective  Chief Complaint  Chief Complaint  Patient presents with   Back Pain    Still the same, pregabalin does not help requesting medication change   HPI   The patient, with a history of chronic back pain,  had a flare in Sep 2024 that was severe enough to elevate her blood pressure. The pain, typically localized to the right side of the back, with intermittent right lower extremity radiculitis. She takes tramadol and methocarbamol prn   She states since Dec the patient reports persistent back pain that is worse than usual, in the  middle of her back but radiating down the left side of buttocks and lower leg but denies any radiation down the leg. The patient has been taking the prescribed tramadol and methocarbamol intermittently to manage the pain without much help, we added Lyrica that she has been taking for almost two months without improvement. Pain level is 7-8/10 after a work shift , it goes down to around 4-5/10 after rest but is always present. Worse feeling is the burning / pain behind her left leg, it goes down lateral lower leg but not to her foot. No bowel or bladder incontinence.    Patient Active Problem List   Diagnosis Date Noted   Sebaceous cyst 09/08/2022   Severe persistent asthma without complication 07/14/2022   History of total hip arthroplasty 01/05/2022   Primary osteoarthritis of right hip 12/21/2021   Anemia 12/21/2021   HTN (hypertension) 08/06/2021   HLD (hyperlipidemia) 08/06/2021   Kidney lesion 08/06/2021   Chronic rhinitis 07/02/2021   Senile purpura (HCC) 05/14/2020   Moderate persistent asthma with allergic rhinitis without complication 05/14/2020   Pre-diabetes 05/14/2020   Hyperglycemia 07/13/2017   Chronic constipation 12/15/2016   Primary osteoarthritis of left hip 11/05/2015   Scoliosis 11/05/2015   Chronic right-sided low back pain with  right-sided sciatica 10/30/2014   Benign essential HTN 10/27/2014   Bradycardia 10/27/2014   Chronic insomnia 10/27/2014   Headache, temporal 10/27/2014   Hypercholesteremia 10/27/2014   Obesity (BMI 30-39.9) 10/27/2014   Changing sleep-work schedule, affecting sleep 10/27/2014   Vitamin D deficiency 10/27/2014   Arthritis of knee, degenerative 09/24/2009    Past Surgical History:  Procedure Laterality Date   ABDOMINAL HYSTERECTOMY     BREAST BIOPSY Right 01/21/2013   stereo - benign   COLONOSCOPY WITH PROPOFOL N/A 06/30/2016   Procedure: COLONOSCOPY WITH PROPOFOL;  Surgeon: Wyline Mood, MD;  Location: [facility] ENDOSCOPY;  Service: Endoscopy;  Laterality: N/A;   COLONOSCOPY WITH PROPOFOL N/A 09/02/2021   Procedure: COLONOSCOPY WITH PROPOFOL;  Surgeon: Wyline Mood, MD;  Location: [facility] ENDOSCOPY;  Service: Gastroenterology;  Laterality: N/A;   KNEE SURGERY Left 2012   Dr. Rosita Kea   OOPHORECTOMY     TUBAL LIGATION     VAGINAL PROLAPSE REPAIR      Family History  Problem Relation Age of Onset   Breast cancer Sister 55       in year 2013   Hyperlipidemia Mother    Diabetes Mother    Cancer Father        Leukemia   Hyperlipidemia Father    Lung cancer Brother    Breast cancer Sister    Heart failure Brother     Social History   Tobacco Use   Smoking status: Former    Current packs/day: 0.00  Average packs/day: 0.3 packs/day for 9.7 years (2.4 ttl pk-yrs)    Types: Cigarettes    Start date: 08/13/1967    Quit date: 04/18/1977    Years since quitting: 46.1    Passive exposure: Past   Smokeless tobacco: Never   Tobacco comments:    smoking cessation materials not required  Substance Use Topics   Alcohol use: No    Alcohol/week: 0.0 standard drinks of alcohol     Current Outpatient Medications:    albuterol (VENTOLIN HFA) 108 (90 Base) MCG/ACT inhaler, Inhale 2 puffs into the lungs every 6 (six) hours as needed for wheezing or shortness of breath., Disp: 8 g, Rfl: 6    amLODipine (NORVASC) 10 MG tablet, Take 1 tablet (10 mg total) by mouth daily., Disp: 90 tablet, Rfl: 1   aspirin EC 81 MG tablet, Take 1 tablet (81 mg total) by mouth daily., Disp: 30 tablet, Rfl: 0   Azelastine-Fluticasone 137-50 MCG/ACT SUSP, Place 1 spray into both nostrils 2 (two) times daily., Disp: 69 g, Rfl: 1   Cholecalciferol (VITAMIN D) 2000 UNITS tablet, Take 1 tablet by mouth daily., Disp: , Rfl:    Dupilumab (DUPIXENT) 300 MG/2ML SOAJ, INJECT 1 PEN SUBCUTANEOUSLY EVERY TWO WEEKS, Disp: 12 mL, Rfl: 1   fluticasone furoate-vilanterol (BREO ELLIPTA) 200-25 MCG/ACT AEPB, Inhale 1 puff into the lungs daily., Disp: 60 each, Rfl: 5   loratadine (CLARITIN) 10 MG tablet, Take 1 tablet (10 mg total) by mouth daily., Disp: 30 tablet, Rfl: 0   Magnesium 250 MG TABS, Take 1 tablet by mouth daily., Disp: , Rfl:    methocarbamol (ROBAXIN) 500 MG tablet, Take 1 tablet (500 mg total) by mouth every evening., Disp: 90 tablet, Rfl: 0   Multiple Vitamin (MULTIVITAMIN ADULT PO), Take by mouth., Disp: , Rfl:    pregabalin (LYRICA) 100 MG capsule, Take 1 capsule (100 mg total) by mouth 3 (three) times daily., Disp: 90 capsule, Rfl: 0   rosuvastatin (CRESTOR) 10 MG tablet, Take 1 tablet by mouth once daily, Disp: 90 tablet, Rfl: 0   tolterodine (DETROL LA) 2 MG 24 hr capsule, Take 1 capsule (2 mg total) by mouth daily., Disp: 90 capsule, Rfl: 1   traMADol (ULTRAM) 50 MG tablet, Take 1 tablet (50 mg total) by mouth every 12 (twelve) hours as needed for severe pain (back pain)., Disp: 90 tablet, Rfl: 0   valsartan-hydrochlorothiazide (DIOVAN-HCT) 160-12.5 MG tablet, Take 1 tablet by mouth daily., Disp: 90 tablet, Rfl: 1   vitamin C (ASCORBIC ACID) 500 MG tablet, Take 1 tablet by mouth daily., Disp: , Rfl:    zolpidem (AMBIEN CR) 12.5 MG CR tablet, Take 1 tablet (12.5 mg total) by mouth at bedtime., Disp: 90 tablet, Rfl: 0  No Known Allergies  I personally reviewed active problem list, medication list,  allergies with the patient/caregiver today.   ROS  Ten systems reviewed and is negative except as mentioned in HPI    Objective  Vitals:   05/17/23 0840  BP: 136/82  Pulse: 71  Resp: 16  Temp: (!) 97.4 F (36.3 C)  TempSrc: Oral  SpO2: 99%  Weight: 186 lb 14.4 oz (84.8 kg)  Height: 5\' 4"  (1.626 m)    Body mass index is 32.08 kg/m.  Physical Exam  Constitutional: Patient appears well-developed and well-nourished. Obese  No distress.  HEENT: head atraumatic, normocephalic, pupils equal and reactive to light, neck supple, throat within normal limits Cardiovascular: Normal rate, regular rhythm and normal heart sounds.  No murmur heard. No BLE edema. Pulmonary/Chest: Effort normal and breath sounds normal. No respiratory distress. Muscular skeletal: positive left straight leg raise  Abdominal: Soft.  There is no tenderness. Psychiatric: Patient has a normal mood and affect. behavior is normal. Judgment and thought content normal.   Recent Results (from the past 2160 hours)  Nitric oxide     Status: None   Collection Time: 02/23/23 10:01 AM  Result Value Ref Range   Nitric Oxide 15     Diabetic Foot Exam:     PHQ2/9:    05/17/2023    8:38 AM 03/22/2023   10:33 AM 12/30/2022    7:59 AM 08/30/2022    7:38 AM 04/29/2022    7:41 AM  Depression screen PHQ 2/9  Decreased Interest 0 0 0 0 0  Down, Depressed, Hopeless 0 0 0 0 0  PHQ - 2 Score 0 0 0 0 0  Altered sleeping 0 0 0 0 0  Tired, decreased energy 0 0 0 0 0  Change in appetite 0 0 0 0 0  Feeling bad or failure about yourself  0 0 0 0 0  Trouble concentrating 0 0 0 0 0  Moving slowly or fidgety/restless 0 0 0 0 0  Suicidal thoughts 0 0 0 0 0  PHQ-9 Score 0 0 0 0 0  Difficult doing work/chores Not difficult at all Not difficult at all       phq 9 is negative  Fall Risk:    05/17/2023    8:37 AM 03/22/2023   10:33 AM 12/30/2022    7:58 AM 08/30/2022    7:38 AM 04/29/2022    7:41 AM  Fall Risk   Falls in the  past year? 0 0 0 0 0  Number falls in past yr: 0 0 0 0 0  Injury with Fall? 0 0 0 0 0  Risk for fall due to : No Fall Risks No Fall Risks No Fall Risks No Fall Risks No Fall Risks  Follow up Falls prevention discussed;Education provided;Falls evaluation completed Falls prevention discussed;Education provided;Falls evaluation completed Falls prevention discussed Falls prevention discussed Falls prevention discussed     Assessment & Plan  1. Chronic bilateral low back pain with left-sided sciatica (Primary)  - MR Lumbar Spine Wo Contrast; Future - methylPREDNISolone (MEDROL DOSEPAK) 4 MG TBPK tablet; Take by mouth as directed.  Dispense: 21 tablet; Refill: 0 - traMADol (ULTRAM) 50 MG tablet; Take 1 tablet (50 mg total) by mouth 3 (three) times daily.  Dispense: 90 tablet; Refill: 0 - pregabalin (LYRICA) 300 MG capsule; Take 1 capsule (300 mg total) by mouth at bedtime as needed.  Dispense: 90 capsule; Refill: 0  2. Lesion of right native kidney  - MR ABDOMEN LIMITED; Future  3. Other specified disorders of kidney and ureter  - MR ABDOMEN LIMITED; Future   Incidental finding on CT abdomen, discussed with patient we will check MRI as recommended by radiologist, no hematuria

## 2023-05-17 NOTE — Addendum Note (Signed)
Addended by: Alba Cory F on: 05/17/2023 12:11 PM   Modules accepted: Orders

## 2023-06-08 ENCOUNTER — Other Ambulatory Visit: Payer: BC Managed Care – PPO

## 2023-06-09 ENCOUNTER — Ambulatory Visit: Payer: BC Managed Care – PPO

## 2023-06-21 ENCOUNTER — Ambulatory Visit
Admission: RE | Admit: 2023-06-21 | Discharge: 2023-06-21 | Disposition: A | Discharge status: Home / Self Care | Payer: BC Managed Care – PPO | Source: Ambulatory Visit | Attending: Family Medicine | Admitting: Family Medicine

## 2023-06-21 DIAGNOSIS — N2889 Other specified disorders of kidney and ureter: Secondary | ICD-10-CM

## 2023-06-21 DIAGNOSIS — G8929 Other chronic pain: Secondary | ICD-10-CM

## 2023-06-21 DIAGNOSIS — N289 Disorder of kidney and ureter, unspecified: Secondary | ICD-10-CM

## 2023-06-21 MED ORDER — GADOPICLENOL 0.5 MMOL/ML IV SOLN
10.0000 mL | Freq: Once | INTRAVENOUS | Status: AC | PRN
  Administered 2023-06-21: 8 mL via INTRAVENOUS

## 2023-06-22 ENCOUNTER — Encounter: Payer: Self-pay | Admitting: Family Medicine

## 2023-06-22 ENCOUNTER — Ambulatory Visit: Payer: BC Managed Care – PPO | Admitting: Family Medicine

## 2023-06-22 VITALS — BP 132/78 | HR 96 | Resp 16 | Ht 64.0 in | Wt 183.6 lb

## 2023-06-22 DIAGNOSIS — M5442 Lumbago with sciatica, left side: Secondary | ICD-10-CM | POA: Diagnosis not present

## 2023-06-22 DIAGNOSIS — J4550 Severe persistent asthma, uncomplicated: Secondary | ICD-10-CM | POA: Diagnosis not present

## 2023-06-22 DIAGNOSIS — E7800 Pure hypercholesterolemia, unspecified: Secondary | ICD-10-CM | POA: Diagnosis not present

## 2023-06-22 DIAGNOSIS — F5104 Psychophysiologic insomnia: Secondary | ICD-10-CM

## 2023-06-22 DIAGNOSIS — N3941 Urge incontinence: Secondary | ICD-10-CM

## 2023-06-22 DIAGNOSIS — J302 Other seasonal allergic rhinitis: Secondary | ICD-10-CM

## 2023-06-22 DIAGNOSIS — I10 Essential (primary) hypertension: Secondary | ICD-10-CM

## 2023-06-22 DIAGNOSIS — G8929 Other chronic pain: Secondary | ICD-10-CM

## 2023-06-22 DIAGNOSIS — R7303 Prediabetes: Secondary | ICD-10-CM

## 2023-06-22 DIAGNOSIS — J3089 Other allergic rhinitis: Secondary | ICD-10-CM

## 2023-06-22 DIAGNOSIS — R011 Cardiac murmur, unspecified: Secondary | ICD-10-CM | POA: Diagnosis not present

## 2023-06-22 MED ORDER — TOLTERODINE TARTRATE ER 2 MG PO CP24
2.0000 mg | ORAL_CAPSULE | Freq: Every day | ORAL | 1 refills | Status: AC
Start: 2023-06-22 — End: ?

## 2023-06-22 MED ORDER — ROSUVASTATIN CALCIUM 10 MG PO TABS: 10.0000 mg | ORAL_TABLET | Freq: Every day | ORAL | 0 refills | Status: DC

## 2023-06-22 MED ORDER — PREGABALIN 300 MG PO CAPS: 300.0000 mg | ORAL_CAPSULE | Freq: Every evening | ORAL | 0 refills | Status: DC | PRN

## 2023-06-22 MED ORDER — ZOLPIDEM TARTRATE ER 12.5 MG PO TBCR: 12.5000 mg | EXTENDED_RELEASE_TABLET | Freq: Every day | ORAL | 0 refills | Status: DC

## 2023-06-22 MED ORDER — TRAMADOL HCL 50 MG PO TABS: 50.0000 mg | ORAL_TABLET | Freq: Two times a day (BID) | ORAL | 0 refills | Status: DC

## 2023-06-22 MED ORDER — PREGABALIN 100 MG PO CAPS: 100.0000 mg | ORAL_CAPSULE | Freq: Two times a day (BID) | ORAL | 0 refills | Status: DC

## 2023-06-22 NOTE — Progress Notes (Addendum)
 Name: Hayley Howe   MRN: [id number]    DOB: 08-24-1945   Date:06/22/2023       Progress Note  Subjective  Chief Complaint  Chief Complaint  Patient presents with   Medical Management of Chronic Issues   HPI   Urge incontinence: she takes detrol prn only. History of bladder tack many years ago. Stable   Chronic sinusitis/AR/Severe persistent Asthma : she was seen by Dr. Marcos Eke 21  - pulmonologist , had a normal spirometry, CT chest  showed some scarring, CT sinus showed chronic sinusitis back in 2020  She is now on Dupixent , singulair and loratadine. Symptoms are worse this time of the year   Chronic Low Back: she has a long history of low back pain, not secondary to trauma, DDD lumbar spine and also scoliosis. 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 She sleeps from around 2 pm-8:30pm for about 5-7 hours when she sleeps during the day She takes Ambien , denies side effects of medication, she is aware of FDA guidelines however unable to sleep with lower dose of Ambien. She only takes medications the nights that she works . Discussed risk of taking Ambien plus lyrica plus tramadol. She states does not take all medications together    OA:  s/p left knee replaced, very seldom has pain or discomfort since  surgery was done by Dr. Rosita Kea in 2012 , she had  right hip replacement by Dr. Odis Luster 12/2021, she is off nsaid's now    Hyperglycemia: last hgbA1C was 6.1 % denies polyphagia, polydipsia or polyuria.  Continue low carb diet.  HTN: taking valsartan  hctz, and also Norvasc, she states no longer having dizziness, no chest pain or palpitation BP is at goal    Dyslipidemia: last LDL stable on Crestor , no myalgia. Last LDL was down to 70's. Recheck next visit     Tingling on fingers on both hands: usually thumb, index finger and sometimes middle finger on both hands, denies neck pain, going on for months. B12 level was normal, discussed possible carpal tunnel , she is waring a wrist brace and wants to hold off on seeing ortho at this time    Patient Active Problem List   Diagnosis Date Noted   Sebaceous cyst 09/08/2022   Severe persistent asthma without complication 07/14/2022   History of total hip arthroplasty 01/05/2022   Primary osteoarthritis of right hip 12/21/2021   Anemia 12/21/2021   HTN (hypertension) 08/06/2021   HLD (hyperlipidemia) 08/06/2021   Kidney lesion 08/06/2021   Chronic rhinitis 07/02/2021   Senile purpura (HCC) 05/14/2020   Moderate persistent asthma with allergic rhinitis without complication 05/14/2020   Pre-diabetes 05/14/2020   Hyperglycemia 07/13/2017   Chronic constipation 12/15/2016   Primary osteoarthritis of left hip 11/05/2015   Scoliosis 11/05/2015   Chronic right-sided low back pain with right-sided sciatica 10/30/2014   Benign essential HTN 10/27/2014   Bradycardia 10/27/2014   Chronic insomnia 10/27/2014   Headache, temporal 10/27/2014  Hypercholesteremia 10/27/2014   Obesity (BMI 30-39.9) 10/27/2014   Changing sleep-work schedule, affecting sleep 10/27/2014   Vitamin D deficiency 10/27/2014   Arthritis of knee, degenerative 09/24/2009    Past Surgical History:  Procedure Laterality Date   ABDOMINAL HYSTERECTOMY     BREAST BIOPSY Right 01/21/2013   stereo - benign   COLONOSCOPY WITH PROPOFOL N/A 06/30/2016   Procedure: COLONOSCOPY WITH PROPOFOL;  Surgeon: Wyline Mood, MD;  Location: [facility] ENDOSCOPY;  Service: Endoscopy;  Laterality: N/A;   COLONOSCOPY WITH PROPOFOL N/A 09/02/2021    Procedure: COLONOSCOPY WITH PROPOFOL;  Surgeon: Wyline Mood, MD;  Location: [facility] ENDOSCOPY;  Service: Gastroenterology;  Laterality: N/A;   KNEE SURGERY Left 2012   Dr. Rosita Kea   OOPHORECTOMY     TUBAL LIGATION     VAGINAL PROLAPSE REPAIR      Family History  Problem Relation Age of Onset   Breast cancer Sister 29       in year 2013   Hyperlipidemia Mother    Diabetes Mother    Cancer Father        Leukemia   Hyperlipidemia Father    Lung cancer Brother    Breast cancer Sister    Heart failure Brother     Social History   Tobacco Use   Smoking status: Former    Current packs/day: 0.00    Average packs/day: 0.3 packs/day for 9.7 years (2.4 ttl pk-yrs)    Types: Cigarettes    Start date: 08/13/1967    Quit date: 04/18/1977    Years since quitting: 46.2    Passive exposure: Past   Smokeless tobacco: Never   Tobacco comments:    smoking cessation materials not required  Substance Use Topics   Alcohol use: No    Alcohol/week: 0.0 standard drinks of alcohol     Current Outpatient Medications:    albuterol (VENTOLIN HFA) 108 (90 Base) MCG/ACT inhaler, Inhale 2 puffs into the lungs every 6 (six) hours as needed for wheezing or shortness of breath., Disp: 8 g, Rfl: 6   amLODipine (NORVASC) 10 MG tablet, Take 1 tablet (10 mg total) by mouth daily., Disp: 90 tablet, Rfl: 1   aspirin EC 81 MG tablet, Take 1 tablet (81 mg total) by mouth daily., Disp: 30 tablet, Rfl: 0   Azelastine-Fluticasone 137-50 MCG/ACT SUSP, Place 1 spray into both nostrils 2 (two) times daily., Disp: 69 g, Rfl: 1   Cholecalciferol (VITAMIN D) 2000 UNITS tablet, Take 1 tablet by mouth daily., Disp: , Rfl:    Dupilumab (DUPIXENT) 300 MG/2ML SOAJ, INJECT 1 PEN SUBCUTANEOUSLY EVERY TWO WEEKS, Disp: 12 mL, Rfl: 1   fluticasone furoate-vilanterol (BREO ELLIPTA) 200-25 MCG/ACT AEPB, Inhale 1 puff into the lungs daily., Disp: 60 each, Rfl: 5   loratadine (CLARITIN) 10 MG tablet, Take 1 tablet (10 mg total) by mouth  daily., Disp: 30 tablet, Rfl: 0   Magnesium 250 MG TABS, Take 1 tablet by mouth daily., Disp: , Rfl:    Multiple Vitamin (MULTIVITAMIN ADULT PO), Take by mouth., Disp: , Rfl:    valsartan-hydrochlorothiazide (DIOVAN-HCT) 160-12.5 MG tablet, Take 1 tablet by mouth daily., Disp: 90 tablet, Rfl: 1   vitamin C (ASCORBIC ACID) 500 MG tablet, Take 1 tablet by mouth daily., Disp: , Rfl:    pregabalin (LYRICA) 100 MG capsule, Take 1 capsule (100 mg total) by mouth 2 (two) times daily., Disp: 180 capsule, Rfl: 0   pregabalin (LYRICA) 300 MG capsule, Take 1 capsule (300 mg total) by mouth  at bedtime as needed., Disp: 90 capsule, Rfl: 0   rosuvastatin (CRESTOR) 10 MG tablet, Take 1 tablet (10 mg total) by mouth daily., Disp: 90 tablet, Rfl: 0   tolterodine (DETROL LA) 2 MG 24 hr capsule, Take 1 capsule (2 mg total) by mouth daily., Disp: 90 capsule, Rfl: 1   traMADol (ULTRAM) 50 MG tablet, Take 1 tablet (50 mg total) by mouth 2 (two) times daily. Chronic pain, Disp: 180 tablet, Rfl: 0   zolpidem (AMBIEN CR) 12.5 MG CR tablet, Take 1 tablet (12.5 mg total) by mouth at bedtime., Disp: 90 tablet, Rfl: 0  No Known Allergies  I personally reviewed active problem list, medication list, allergies with the patient/caregiver today.   ROS  Ten systems reviewed and is negative except as mentioned in HPI    Objective  Vitals:   06/22/23 1007  BP: 132/78  Pulse: 96  Resp: 16  SpO2: 98%  Weight: 183 lb 9.6 oz (83.3 kg)  Height: 5\' 4"  (1.626 m)    Body mass index is 31.51 kg/m.  Physical Exam  Constitutional: Patient appears well-developed and well-nourished. Obese  No distress.  HEENT: head atraumatic, normocephalic, pupils equal and reactive to light, neck supple Cardiovascular: Normal rate, regular rhythm and normal heart sounds.  2/6  murmur heard. No BLE edema. Pulmonary/Chest: Effort normal and breath sounds normal. No respiratory distress. Abdominal: Soft.  There is no  tenderness. Psychiatric: Patient has a normal mood and affect. behavior is normal. Judgment and thought content normal.   Diabetic Foot Exam:     PHQ2/9:    05/17/2023    8:38 AM 03/22/2023   10:33 AM 12/30/2022    7:59 AM 08/30/2022    7:38 AM 04/29/2022    7:41 AM  Depression screen PHQ 2/9  Decreased Interest 0 0 0 0 0  Down, Depressed, Hopeless 0 0 0 0 0  PHQ - 2 Score 0 0 0 0 0  Altered sleeping 0 0 0 0 0  Tired, decreased energy 0 0 0 0 0  Change in appetite 0 0 0 0 0  Feeling bad or failure about yourself  0 0 0 0 0  Trouble concentrating 0 0 0 0 0  Moving slowly or fidgety/restless 0 0 0 0 0  Suicidal thoughts 0 0 0 0 0  PHQ-9 Score 0 0 0 0 0  Difficult doing work/chores Not difficult at all Not difficult at all       phq 9 is negative  Fall Risk:    05/17/2023    8:37 AM 03/22/2023   10:33 AM 12/30/2022    7:58 AM 08/30/2022    7:38 AM 04/29/2022    7:41 AM  Fall Risk   Falls in the past year? 0 0 0 0 0  Number falls in past yr: 0 0 0 0 0  Injury with Fall? 0 0 0 0 0  Risk for fall due to : No Fall Risks No Fall Risks No Fall Risks No Fall Risks No Fall Risks  Follow up Falls prevention discussed;Education provided;Falls evaluation completed Falls prevention discussed;Education provided;Falls evaluation completed Falls prevention discussed Falls prevention discussed Falls prevention discussed     Assessment & Plan  1. Hypercholesteremia  - rosuvastatin (CRESTOR) 10 MG tablet; Take 1 tablet (10 mg total) by mouth daily.  Dispense: 90 tablet; Refill: 0  2. Benign essential HTN (Primary)  At goal   3. Urge incontinence of urine  - tolterodine (DETROL LA) 2 MG  24 hr capsule; Take 1 capsule (2 mg total) by mouth daily.  Dispense: 90 capsule; Refill: 1  4. Chronic bilateral low back pain with left-sided sciatica  - traMADol (ULTRAM) 50 MG tablet; Take 1 tablet (50 mg total) by mouth 2 (two) times daily. Chronic pain  Dispense: 180 tablet; Refill: 0 -  pregabalin (LYRICA) 300 MG capsule; Take 1 capsule (300 mg total) by mouth at bedtime as needed.  Dispense: 90 capsule; Refill: 0 - pregabalin (LYRICA) 100 MG capsule; Take 1 capsule (100 mg total) by mouth 2 (two) times daily.  Dispense: 180 capsule; Refill: 0  5. Chronic insomnia  - zolpidem (AMBIEN CR) 12.5 MG CR tablet; Take 1 tablet (12.5 mg total) by mouth at bedtime.  Dispense: 90 tablet; Refill: 0  6. Severe Persistent Asthma without complication  Stable  7. Pre-diabetes  On low carb diet  8. Heart murmur  She re-schedule Echo

## 2023-06-26 ENCOUNTER — Telehealth: Payer: Self-pay

## 2023-06-26 NOTE — Telephone Encounter (Signed)
 PA done waiting on insurance to determine

## 2023-06-26 NOTE — Telephone Encounter (Signed)
 Will work on [employer]

## 2023-06-26 NOTE — Telephone Encounter (Signed)
 Prior Auth from Cover My Meds   traMADol (ULTRAM) 50 MG tablet

## 2023-06-27 ENCOUNTER — Ambulatory Visit: Payer: BC Managed Care – PPO | Admitting: Family Medicine

## 2023-07-07 ENCOUNTER — Ambulatory Visit
Admission: RE | Admit: 2023-07-07 | Discharge: 2023-07-07 | Disposition: A | Discharge status: Home / Self Care | Payer: BC Managed Care – PPO | Source: Ambulatory Visit | Attending: Family Medicine | Admitting: Family Medicine

## 2023-07-07 DIAGNOSIS — R011 Cardiac murmur, unspecified: Secondary | ICD-10-CM | POA: Diagnosis present

## 2023-07-07 DIAGNOSIS — I517 Cardiomegaly: Secondary | ICD-10-CM | POA: Diagnosis not present

## 2023-07-07 LAB — ECHOCARDIOGRAM COMPLETE
AR max vel: 1.78 cm2
AV Area VTI: 2.05 cm2
AV Area mean vel: 1.84 cm2
AV Mean grad: 6.5 mmHg
AV Peak grad: 10.4 mmHg
Ao pk vel: 1.61 m/s
Area-P 1/2: 3.54 cm2
MV VTI: 2.92 cm2
S' Lateral: 2.5 cm

## 2023-07-07 NOTE — Progress Notes (Signed)
*  PRELIMINARY RESULTS* Echocardiogram 2D Echocardiogram has been performed.  Cristela Blue 07/07/2023, 9:16 AM

## 2023-08-25 ENCOUNTER — Encounter: Payer: Self-pay | Admitting: Pulmonary Disease

## 2023-08-25 ENCOUNTER — Ambulatory Visit: Payer: BC Managed Care – PPO | Admitting: Pulmonary Disease

## 2023-08-25 VITALS — BP 138/70 | HR 67 | Temp 97.6°F | Ht 64.0 in | Wt 188.8 lb

## 2023-08-25 DIAGNOSIS — R768 Other specified abnormal immunological findings in serum: Secondary | ICD-10-CM

## 2023-08-25 DIAGNOSIS — J329 Chronic sinusitis, unspecified: Secondary | ICD-10-CM | POA: Diagnosis not present

## 2023-08-25 DIAGNOSIS — J4550 Severe persistent asthma, uncomplicated: Secondary | ICD-10-CM

## 2023-08-25 DIAGNOSIS — R0609 Other forms of dyspnea: Secondary | ICD-10-CM

## 2023-08-25 LAB — NITRIC OXIDE: Nitric Oxide: 14

## 2023-08-25 NOTE — Progress Notes (Signed)
 Subjective:    Patient ID: Hayley Howe, female    DOB: Apr 13, 1946, 78 y.o.   MRN: [id number]  Patient Care Team: Sowles, Krichna, MD as PCP - General (Family Medicine) Kip Peon, DC as Consulting Physician (Chiropractic Medicine) Lesly Raspberry, MD as Consulting Physician (Otolaryngology) Luke Salaam, MD as Consulting Physician (Gastroenterology) Marc Senior, MD as Consulting Physician (Pulmonary Disease) Jerlyn Moons, MD as Consulting Physician (Orthopedic Surgery)  Chief Complaint  Patient presents with   Follow-up    Shortness of breath on exertion. No cough or wheezing.     BACKGROUND/INTERVAL:Hayley Howe is a 78 year old very remote former smoker with minimal tobacco exposure, who presents for follow-up on the issue of severe persistent asthma,allergic phenotype,and chronic rhinosinusitis.  This is a scheduled visit.  She was last seen on 23 February 2023 by me.  At that time she was noted to be well-controlled on Dupixent , Breo and as needed albuterol . She started Dupixent  on March 2023, she has been tolerating it well.   HPI Discussed the use of AI scribe software for clinical note transcription with the patient, who gave verbal consent to proceed.  History of Present Illness   Hayley Howe is a 78 year old female with severe persistent asthma who presents for follow-up.  She is currently managing her asthma well with Dupixent  and a maintenance inhaler, Breo Ellipta  200. She has not required the use of her albuterol  inhaler recently, indicating good control of her asthma symptoms.  She experiences occasional shortness of breath, describing it as feeling 'kind of winded' at times. This is a new symptom for her. She speculates that her weight might be contributing to this issue and notes that it might be related to seasonal changes, particularly with pollen.  She has not had any fevers, chills or sweats.  No chest pain.  No lower extremity edema.  A recent  echocardiogram was performed, which showed some mild, grade 1 diastolic dysfunction and mild dilatation of the left atrium.      TEST/EVENTS : PFTs on February 28, 2019 showed normal lung function with an FEV1 at 87%, ratio 72, FVC 94%, no significant bronchodilator response CT chest November 2020 mild bronchial wall thickening with bronchial secretions and associated scarring and linear atelectasis in the right middle lobe, left upper lobe and lingula.  Otherwise clear with no evidence of interstitial lung disease, findings of healed granulomatous disease in the right lower lobe CT sinus April 08, 2019 extensive acute on chronic sinusitis, air-fluid levels in the maxillary sinuses, minimal improvement and left frontal sinus and scattered ethmoid air cells Allergen panel, January 2023 absolute eosinophil count 500, IgE 545, allergen panel negative except for Cladosporium Herbarum IgE (common mold)  07/14/2022 PFTs: FEV1 1.97 L or 91% predicted, FVC 2.38 L or 83% predicted, FEV1/FVC 83%, no significant bronchodilator response. Diffusion capacity normal.  Compared to PFTs from 2020, no significant change. 07/07/2023 echocardiogram: LVEF is 60 to 65% no regional wall motion abnormalities.  Mild LVH, grade 1 DD.  Right ventricular systolic function normal.  Right ventricular size normal.  Left atrial size mildly dilated.  No valvular abnormalities.   Review of Systems A 10 point review of systems was performed and it is as noted above otherwise negative.   Patient Active Problem List   Diagnosis Date Noted   Sebaceous cyst 09/08/2022   Severe persistent asthma without complication 07/14/2022   History of total hip arthroplasty 01/05/2022   Primary osteoarthritis of right hip 12/21/2021  Anemia 12/21/2021   HTN (hypertension) 08/06/2021   HLD (hyperlipidemia) 08/06/2021   Kidney lesion 08/06/2021   Chronic rhinitis 07/02/2021   Senile purpura (HCC) 05/14/2020   Moderate persistent asthma  with allergic rhinitis without complication 05/14/2020   Pre-diabetes 05/14/2020   Hyperglycemia 07/13/2017   Chronic constipation 12/15/2016   Primary osteoarthritis of left hip 11/05/2015   Scoliosis 11/05/2015   Chronic right-sided low back pain with right-sided sciatica 10/30/2014   Benign essential HTN 10/27/2014   Bradycardia 10/27/2014   Chronic insomnia 10/27/2014   Headache, temporal 10/27/2014   Hypercholesteremia 10/27/2014   Obesity (BMI 30-39.9) 10/27/2014   Changing sleep-work schedule, affecting sleep 10/27/2014   Vitamin D  deficiency 10/27/2014   Arthritis of knee, degenerative 09/24/2009    Social History   Tobacco Use   Smoking status: Former    Current packs/day: 0.00    Average packs/day: 0.3 packs/day for 9.7 years (2.4 ttl pk-yrs)    Types: Cigarettes    Start date: 08/13/1967    Quit date: 04/18/1977    Years since quitting: 46.3    Passive exposure: Past   Smokeless tobacco: Never   Tobacco comments:    smoking cessation materials not required  Substance Use Topics   Alcohol use: No    Alcohol/week: 0.0 standard drinks of alcohol    No Known Allergies  Current Meds  Medication Sig   albuterol  (VENTOLIN  HFA) 108 (90 Base) MCG/ACT inhaler Inhale 2 puffs into the lungs every 6 (six) hours as needed for wheezing or shortness of breath.   amLODipine  (NORVASC ) 10 MG tablet Take 1 tablet (10 mg total) by mouth daily.   aspirin  EC 81 MG tablet Take 1 tablet (81 mg total) by mouth daily.   Azelastine -Fluticasone  137-50 MCG/ACT SUSP Place 1 spray into both nostrils 2 (two) times daily.   Cholecalciferol  (VITAMIN D ) 2000 UNITS tablet Take 1 tablet by mouth daily.   Dupilumab  (DUPIXENT ) 300 MG/2ML SOAJ INJECT 1 PEN SUBCUTANEOUSLY EVERY TWO WEEKS   fluticasone  furoate-vilanterol (BREO ELLIPTA ) 200-25 MCG/ACT AEPB Inhale 1 puff into the lungs daily.   loratadine  (CLARITIN ) 10 MG tablet Take 1 tablet (10 mg total) by mouth daily.   Magnesium  250 MG TABS Take 1  tablet by mouth daily.   Multiple Vitamin (MULTIVITAMIN ADULT PO) Take by mouth.   pregabalin  (LYRICA ) 100 MG capsule Take 1 capsule (100 mg total) by mouth 2 (two) times daily.   pregabalin  (LYRICA ) 300 MG capsule Take 1 capsule (300 mg total) by mouth at bedtime as needed.   rosuvastatin  (CRESTOR ) 10 MG tablet Take 1 tablet (10 mg total) by mouth daily.   tolterodine  (DETROL  LA) 2 MG 24 hr capsule Take 1 capsule (2 mg total) by mouth daily.   traMADol  (ULTRAM ) 50 MG tablet Take 1 tablet (50 mg total) by mouth 2 (two) times daily. Chronic pain   valsartan -hydrochlorothiazide  (DIOVAN -HCT) 160-12.5 MG tablet Take 1 tablet by mouth daily.   vitamin C (ASCORBIC ACID ) 500 MG tablet Take 1 tablet by mouth daily.   zolpidem  (AMBIEN  CR) 12.5 MG CR tablet Take 1 tablet (12.5 mg total) by mouth at bedtime.    Immunization History  Administered Date(s) Administered   Fluad Quad(high Dose 65+) 12/13/2018, 01/09/2020, 12/24/2020, 12/30/2022   Influenza, High Dose Seasonal PF 01/20/2016, 12/15/2016, 01/19/2018, 12/17/2021   Influenza, Seasonal, Injecte, Preservative Fre 02/18/2010, 02/11/2011, 03/29/2012   Influenza,inj,Quad PF,6+ Mos 01/03/2013, 01/10/2014, 03/05/2015   Influenza-Unspecified 12/17/2013   Moderna SARS-COV2 Booster Vaccination 02/17/2021   Moderna Sars-Covid-2  Vaccination 05/26/2019, 06/26/2019, 02/20/2020, 10/15/2020   Pneumococcal Conjugate-13 06/19/2014   Pneumococcal Polysaccharide-23 02/18/2010, 07/02/2015   Rsv, Bivalent, Protein Subunit Rsvpref,pf Pattricia Bores) 03/17/2022   Td 08/12/2016   Tdap 09/14/2007   Zoster Recombinant(Shingrix) 06/03/2021, 08/05/2021   Zoster, Live 08/20/2015        Objective:     BP 138/70 (BP Location: Left Arm, Patient Position: Sitting, Cuff Size: Normal)   Pulse 67   Temp 97.6 F (36.4 C) (Temporal)   Ht 5\' 4"  (1.626 m)   Wt 188 lb 12.8 oz (85.6 kg)   SpO2 99%   BMI 32.41 kg/m   SpO2: 99 %  GENERAL: Well-developed, overweight woman in  no acute distress, well groomed.  Ambulatory with assistance of a cane.  No conversational dyspnea.  Speech clear. HEAD: Normocephalic, atraumatic.  Nasal quality to speech. EYES: Pupils equal, round, reactive to light.  No scleral icterus.  MOUTH: Poor dentition, some missing teeth, oral mucosa moist.  No thrush.   NECK: Supple. No thyromegaly. Trachea midline. No JVD.  No adenopathy. PULMONARY: Good air entry bilaterally.  No adventitious sounds. CARDIOVASCULAR: S1 and S2. Regular rate and rhythm.  Grade 1/6 systolic ejection murmur left sternal border. No gallops or rubs.   GASTROINTESTINAL: Benign. MUSCULOSKELETAL: No joint deformity, no clubbing, no edema.  NEUROLOGIC: No focal deficit, uses cane for gait assistance, speech is fluent.   SKIN: Intact,warm,dry.  No overt rashes noted. PSYCH: Mood and behavior appropriate     Lab Results  Component Value Date   NITRICOXIDE 14 08/25/2023  *No evidence of type II inflammation   Assessment & Plan:     ICD-10-CM   1. Severe persistent asthma without complication  J45.50 Nitric oxide     Pulmonary function test    2. Chronic rhinosinusitis  J32.9     3. Elevated IgE level  R76.8     4. Dyspnea on exertion  R06.09 Pulmonary function test      Orders Placed This Encounter  Procedures   Nitric oxide    Pulmonary function test    Standing Status:   Future    Expected Date:   02/25/2024    Expiration Date:   08/24/2024    Where should this test be performed?:   Outpatient Pulmonary    What type of PFT is being ordered?:   Full PFT    MIP/MEP:   No   Discussion:    Severe persistent asthma Asthma is well-controlled with current treatment regimen. Occasional dyspnea may be related to allergies and deconditioning. Echocardiogram indicates mild diastolic dysfunction, but overall cardiac function is normal. Lungs are clear on auscultation.  No type II inflammation evident on nitric oxide  test. - Continue current asthma management with  Dupixent  and maintenance inhaler. - Schedule repeat pulmonary function tests before next visit. - Advise gradual increase in physical activity to address deconditioning. - Follow up in six months.    Advised if symptoms do not improve or worsen, to please contact office for sooner follow up or seek emergency care.    I spent 30 minutes of dedicated to the care of this patient on the date of this encounter to include pre-visit review of records, face-to-face time with the patient discussing conditions above, post visit ordering of testing, clinical documentation with the electronic health record, making appropriate referrals as documented, and communicating necessary findings to members of the patients care team.     C. Chloe Counter, MD Advanced Bronchoscopy PCCM [facility]    *This note  was generated using voice recognition software/Dragon and/or AI transcription program.  Despite best efforts to proofread, errors can occur which can change the meaning. Any transcriptional errors that result from this process are unintentional and may not be fully corrected at the time of dictation.

## 2023-08-25 NOTE — Patient Instructions (Signed)
 VISIT SUMMARY:  Today, you came in for a follow-up visit to manage your severe persistent asthma. You mentioned that your asthma is well-controlled with your current medications, and you have not needed your albuterol  inhaler recently. However, you have been experiencing occasional shortness of breath, which you think might be related to your weight or seasonal allergies. A recent echocardiogram showed some stiffness in your heart, but overall, your heart function is normal.  YOUR PLAN:  -SEVERE PERSISTENT ASTHMA: Severe persistent asthma is a long-term condition where the airways in your lungs are inflamed and narrowed, making it hard to breathe. Your asthma is currently well-controlled with Dupixent  and a maintenance inhaler. You should continue using these medications as prescribed. We will schedule repeat pulmonary function tests before your next visit to monitor your lung function. Additionally, you are advised to gradually increase your physical activity to help with deconditioning.  INSTRUCTIONS:  Please follow up in six months for your next appointment. Make sure to schedule your repeat pulmonary function tests before this visit.

## 2023-09-04 ENCOUNTER — Other Ambulatory Visit: Payer: Self-pay | Admitting: Pulmonary Disease

## 2023-09-04 DIAGNOSIS — J4550 Severe persistent asthma, uncomplicated: Secondary | ICD-10-CM

## 2023-10-27 ENCOUNTER — Encounter: Payer: Self-pay | Admitting: Family Medicine

## 2023-10-27 ENCOUNTER — Telehealth: Payer: Self-pay | Admitting: Pharmacy Technician

## 2023-10-27 ENCOUNTER — Ambulatory Visit: Admitting: Family Medicine

## 2023-10-27 ENCOUNTER — Other Ambulatory Visit (HOSPITAL_COMMUNITY): Payer: Self-pay

## 2023-10-27 VITALS — BP 132/80 | HR 82 | Resp 18 | Ht 64.0 in | Wt 185.5 lb

## 2023-10-27 DIAGNOSIS — I10 Essential (primary) hypertension: Secondary | ICD-10-CM

## 2023-10-27 DIAGNOSIS — R7303 Prediabetes: Secondary | ICD-10-CM

## 2023-10-27 DIAGNOSIS — R202 Paresthesia of skin: Secondary | ICD-10-CM

## 2023-10-27 DIAGNOSIS — J4550 Severe persistent asthma, uncomplicated: Secondary | ICD-10-CM | POA: Diagnosis not present

## 2023-10-27 DIAGNOSIS — N3941 Urge incontinence: Secondary | ICD-10-CM

## 2023-10-27 DIAGNOSIS — I700 Atherosclerosis of aorta: Secondary | ICD-10-CM | POA: Diagnosis not present

## 2023-10-27 DIAGNOSIS — I517 Cardiomegaly: Secondary | ICD-10-CM

## 2023-10-27 DIAGNOSIS — J3089 Other allergic rhinitis: Secondary | ICD-10-CM

## 2023-10-27 DIAGNOSIS — J302 Other seasonal allergic rhinitis: Secondary | ICD-10-CM | POA: Insufficient documentation

## 2023-10-27 DIAGNOSIS — F5104 Psychophysiologic insomnia: Secondary | ICD-10-CM

## 2023-10-27 DIAGNOSIS — E7800 Pure hypercholesterolemia, unspecified: Secondary | ICD-10-CM

## 2023-10-27 DIAGNOSIS — G8929 Other chronic pain: Secondary | ICD-10-CM | POA: Diagnosis not present

## 2023-10-27 DIAGNOSIS — M170 Bilateral primary osteoarthritis of knee: Secondary | ICD-10-CM

## 2023-10-27 MED ORDER — PREGABALIN 100 MG PO CAPS
100.0000 mg | ORAL_CAPSULE | Freq: Two times a day (BID) | ORAL | 1 refills | Status: AC
Start: 2023-10-27 — End: ?

## 2023-10-27 MED ORDER — TRAMADOL HCL 50 MG PO TABS: 50.0000 mg | ORAL_TABLET | Freq: Two times a day (BID) | ORAL | 0 refills | Status: DC

## 2023-10-27 MED ORDER — AMLODIPINE BESYLATE 10 MG PO TABS: 10.0000 mg | ORAL_TABLET | Freq: Every day | ORAL | 1 refills | Status: AC

## 2023-10-27 MED ORDER — PREGABALIN 300 MG PO CAPS: 300.0000 mg | ORAL_CAPSULE | Freq: Every evening | ORAL | 1 refills | Status: AC | PRN

## 2023-10-27 MED ORDER — VALSARTAN-HYDROCHLOROTHIAZIDE 160-12.5 MG PO TABS: 1.0000 | ORAL_TABLET | Freq: Every day | ORAL | 1 refills | Status: AC

## 2023-10-27 MED ORDER — AZELASTINE-FLUTICASONE 137-50 MCG/ACT NA SUSP: 1.0000 | Freq: Two times a day (BID) | NASAL | 1 refills | Status: AC

## 2023-10-27 MED ORDER — ROSUVASTATIN CALCIUM 10 MG PO TABS: 10.0000 mg | ORAL_TABLET | Freq: Every day | ORAL | 1 refills | Status: AC

## 2023-10-27 MED ORDER — ZOLPIDEM TARTRATE ER 12.5 MG PO TBCR: 12.5000 mg | EXTENDED_RELEASE_TABLET | Freq: Every day | ORAL | 0 refills | Status: AC

## 2023-10-27 NOTE — Telephone Encounter (Signed)
 Pharmacy Patient Advocate Encounter   Received notification from CoverMyMeds that prior authorization for traMADol  HCl 50MG  tablets is required/requested.   Insurance verification completed.   The patient is insured through [facility] .   Per test claim: PA required; PA submitted to above mentioned insurance via CoverMyMeds Key/confirmation #/EOC [facility] Status is pending

## 2023-10-27 NOTE — Telephone Encounter (Signed)
 Pharmacy Patient Advocate Encounter  Received notification from [facility] that Prior Authorization for traMADol  HCl 50MG  tablets has been APPROVED from 10/27/23 to 04/28/24   PA #/Case ID/Reference #: [id number]

## 2023-10-27 NOTE — Progress Notes (Signed)
 Name: Hayley Howe   MRN: [id number]    DOB: [birth year]   Date:10/27/2023       Progress Note  Subjective  Chief Complaint  Chief Complaint  Patient presents with   Medical Management of Chronic Issues   Discussed the use of AI scribe software for clinical note transcription with the patient, who gave verbal consent to proceed.  History of Present Illness Hayley Howe is a 78 year old female with severe persistent asthma who presents for a regular follow-up visit.  She has severe persistent asthma and is currently taking Breo and Dupixent . She reports no wheezing, very low shortness of breath, and has not needed her albuterol  rescue inhaler recently.   She has perennial allergic rhinitis, which worsens seasonally. She uses azelastine  and fluticasone  nasal sprays, along with loratadine  10 mg. She reports very little runny nose and no nasal congestion.  She has hypertension and takes amlodipine  10 mg and valsartan  HCTZ 160/12.5 mg daily. She reports no dizziness, chest pain, or palpitations.  She has dyslipidemia and atherosclerosis of the aorta, with a CT scan in April 2023 showing plaque formation. She is on rosuvastatin  for cholesterol management.  She experiences chronic bilateral low back pain with radiculopathy, primarily on the left side. She takes pregabalin  100 mg in the morning and 300 mg at 11 AM. She also uses tramadol . She reports numbness in her left thumb and fingers, affecting her ability to perform tasks like putting on earrings. She uses a cane, especially when walking long distances due to back pain, and sees a chiropractor monthly.  She has a history of urge incontinence, previously managed with Detrol  as needed. She experiences occasional urgency at night but does not regularly use Detrol .  She has chronic insomnia, for which she takes zolpidem  as needed, primarily on nights she works. She reports no side effects from zolpidem .  She has a history of chronic  sinusitis and fatty liver associated with prediabetes. Her A1c has been around 6.1.    Patient Active Problem List   Diagnosis Date Noted   Sebaceous cyst 09/08/2022   Severe persistent asthma without complication 07/14/2022   History of total hip arthroplasty 01/05/2022   Primary osteoarthritis of right hip 12/21/2021   Anemia 12/21/2021   HTN (hypertension) 08/06/2021   HLD (hyperlipidemia) 08/06/2021   Kidney lesion 08/06/2021   Chronic rhinitis 07/02/2021   Senile purpura (HCC) 05/14/2020   Moderate persistent asthma with allergic rhinitis without complication 05/14/2020   Pre-diabetes 05/14/2020   Hyperglycemia 07/13/2017   Chronic constipation 12/15/2016   Primary osteoarthritis of left hip 11/05/2015   Scoliosis 11/05/2015   Chronic right-sided low back pain with right-sided sciatica 10/30/2014   Benign essential HTN 10/27/2014   Bradycardia 10/27/2014   Chronic insomnia 10/27/2014   Headache, temporal 10/27/2014   Hypercholesteremia 10/27/2014   Obesity (BMI 30-39.9) 10/27/2014   Changing sleep-work schedule, affecting sleep 10/27/2014   Vitamin D  deficiency 10/27/2014   Arthritis of knee, degenerative 09/24/2009    Past Surgical History:  Procedure Laterality Date   ABDOMINAL HYSTERECTOMY     BREAST BIOPSY Right 01/21/2013   stereo - benign   COLONOSCOPY WITH PROPOFOL  N/A 06/30/2016   Procedure: COLONOSCOPY WITH PROPOFOL ;  Surgeon: Ruel Kung, MD;  Location: [facility] ENDOSCOPY;  Service: Endoscopy;  Laterality: N/A;   COLONOSCOPY WITH PROPOFOL  N/A 09/02/2021   Procedure: COLONOSCOPY WITH PROPOFOL ;  Surgeon: Kung Ruel, MD;  Location: [facility] ENDOSCOPY;  Service: Gastroenterology;  Laterality: N/A;   KNEE SURGERY Left  2012   Dr. Kathlynn   OOPHORECTOMY     TUBAL LIGATION     VAGINAL PROLAPSE REPAIR      Family History  Problem Relation Age of Onset   Breast cancer Sister 67       in year 2013   Hyperlipidemia Mother    Diabetes Mother    Cancer Father         Leukemia   Hyperlipidemia Father    Lung cancer Brother    Breast cancer Sister    Heart failure Brother     Social History   Tobacco Use   Smoking status: Former    Current packs/day: 0.00    Average packs/day: 0.3 packs/day for 9.7 years (2.4 ttl pk-yrs)    Types: Cigarettes    Start date: 08/13/1967    Quit date: 04/18/1977    Years since quitting: 46.5    Passive exposure: Past   Smokeless tobacco: Never   Tobacco comments:    smoking cessation materials not required  Substance Use Topics   Alcohol use: No    Alcohol/week: 0.0 standard drinks of alcohol     Current Outpatient Medications:    albuterol  (VENTOLIN  HFA) 108 (90 Base) MCG/ACT inhaler, Inhale 2 puffs into the lungs every 6 (six) hours as needed for wheezing or shortness of breath., Disp: 8 g, Rfl: 6   amLODipine  (NORVASC ) 10 MG tablet, Take 1 tablet (10 mg total) by mouth daily., Disp: 90 tablet, Rfl: 1   aspirin  EC 81 MG tablet, Take 1 tablet (81 mg total) by mouth daily., Disp: 30 tablet, Rfl: 0   Azelastine -Fluticasone  137-50 MCG/ACT SUSP, Place 1 spray into both nostrils 2 (two) times daily., Disp: 69 g, Rfl: 1   Cholecalciferol  (VITAMIN D ) 2000 UNITS tablet, Take 1 tablet by mouth daily., Disp: , Rfl:    DUPIXENT  300 MG/2ML SOAJ, INJECT 1 PEN SUBCUTANEOUSLY EVERY TWO WEEKS., Disp: 4 mL, Rfl: 11   fluticasone  furoate-vilanterol (BREO ELLIPTA ) 200-25 MCG/ACT AEPB, Inhale 1 puff into the lungs daily., Disp: 60 each, Rfl: 5   loratadine  (CLARITIN ) 10 MG tablet, Take 1 tablet (10 mg total) by mouth daily., Disp: 30 tablet, Rfl: 0   Magnesium  250 MG TABS, Take 1 tablet by mouth daily., Disp: , Rfl:    Multiple Vitamin (MULTIVITAMIN ADULT PO), Take by mouth., Disp: , Rfl:    pregabalin  (LYRICA ) 100 MG capsule, Take 1 capsule (100 mg total) by mouth 2 (two) times daily., Disp: 180 capsule, Rfl: 0   pregabalin  (LYRICA ) 300 MG capsule, Take 1 capsule (300 mg total) by mouth at bedtime as needed., Disp: 90 capsule, Rfl:  0   rosuvastatin  (CRESTOR ) 10 MG tablet, Take 1 tablet (10 mg total) by mouth daily., Disp: 90 tablet, Rfl: 0   tolterodine  (DETROL  LA) 2 MG 24 hr capsule, Take 1 capsule (2 mg total) by mouth daily., Disp: 90 capsule, Rfl: 1   traMADol  (ULTRAM ) 50 MG tablet, Take 1 tablet (50 mg total) by mouth 2 (two) times daily. Chronic pain, Disp: 180 tablet, Rfl: 0   valsartan -hydrochlorothiazide  (DIOVAN -HCT) 160-12.5 MG tablet, Take 1 tablet by mouth daily., Disp: 90 tablet, Rfl: 1   vitamin C (ASCORBIC ACID ) 500 MG tablet, Take 1 tablet by mouth daily., Disp: , Rfl:    zolpidem  (AMBIEN  CR) 12.5 MG CR tablet, Take 1 tablet (12.5 mg total) by mouth at bedtime., Disp: 90 tablet, Rfl: 0  No Known Allergies  I personally reviewed active problem list, medication list, allergies  with the patient/caregiver today.   ROS  Ten systems reviewed and is negative except as mentioned in HPI    Objective Physical Exam  CONSTITUTIONAL: Patient appears well-developed and well-nourished. No distress. HEENT: Head atraumatic, normocephalic, neck supple. CARDIOVASCULAR: Normal rate, regular rhythm and normal heart sounds. No murmur heard. No BLE edema. PULMONARY: Effort normal and breath sounds normal. No wheeze. No respiratory distress. ABDOMINAL: There is no tenderness or distention. MUSCULOSKELETAL: Normal gait. Without gross motor or sensory deficit. PSYCHIATRIC: Patient has a normal mood and affect. Behavior is normal. Judgment and thought content normal.  Vitals:   10/27/23 0858  BP: 132/80  Pulse: 82  Resp: 18  SpO2: 99%  Weight: 185 lb 8 oz (84.1 kg)  Height: 5' 4 (1.626 m)    Body mass index is 31.84 kg/m.  Recent Results (from the past 2160 hours)  Nitric oxide      Status: None   Collection Time: 08/25/23  8:48 AM  Result Value Ref Range   Nitric Oxide  14      PHQ2/9:    10/27/2023    8:58 AM 05/17/2023    8:38 AM 03/22/2023   10:33 AM 12/30/2022    7:59 AM 08/30/2022    7:38 AM   Depression screen PHQ 2/9  Decreased Interest 0 0 0 0 0  Down, Depressed, Hopeless 0 0 0 0 0  PHQ - 2 Score 0 0 0 0 0  Altered sleeping  0 0 0 0  Tired, decreased energy  0 0 0 0  Change in appetite  0 0 0 0  Feeling bad or failure about yourself   0 0 0 0  Trouble concentrating  0 0 0 0  Moving slowly or fidgety/restless  0 0 0 0  Suicidal thoughts  0 0 0 0  PHQ-9 Score  0 0 0 0  Difficult doing work/chores  Not difficult at all Not difficult at all      phq 9 is negative  Fall Risk:    10/27/2023    8:51 AM 05/17/2023    8:37 AM 03/22/2023   10:33 AM 12/30/2022    7:58 AM 08/30/2022    7:38 AM  Fall Risk   Falls in the past year? 0 0 0 0 0  Number falls in past yr: 0 0 0 0 0  Injury with Fall? 0 0 0 0 0  Risk for fall due to : No Fall Risks No Fall Risks No Fall Risks No Fall Risks No Fall Risks  Follow up Falls evaluation completed Falls prevention discussed;Education provided;Falls evaluation completed Falls prevention discussed;Education provided;Falls evaluation completed Falls prevention discussed Falls prevention discussed    Assessment & Plan Severe persistent asthma Asthma well-controlled with current regimen. Dupixent  reduced albuterol  need. - Continue Breo and Dupixent  as prescribed. - No need for albuterol  at this time.  Perennial allergic rhinitis Symptoms well-managed with current treatment. - Continue azelastine  and fluticasone  nasal spray. - Continue loratadine  10 mg as needed.  Benign essential hypertension Blood pressure well-controlled with current medications. - Continue amlodipine  10 mg daily. - Continue valsartan  HCTZ 160/12.5 mg daily.  Left ventricular hypertrophy and grade 1 diastolic dysfunction Left ventricular hypertrophy and diastolic dysfunction noted. Likely related to long-term hypertension. - Continue current antihypertensive regimen.  Dyslipidemia and atherosclerosis of aorta Managed with cholesterol medication. CT showed aortic  plaque. - Continue rosuvastatin . - Order cholesterol panel.  Chronic anemia, unspecified Chronic anemia with previous iron deficiency. - Order blood work to assess  anemia.  Fatty liver associated with prediabetes and obesity Fatty liver likely related to prediabetes and obesity. Discussed lifestyle modifications and medication options. - Monitor liver enzymes. - Discuss potential use of metformin for prediabetes.  Prediabetes Prediabetes with A1c at 6.1. Discussed lifestyle modifications and medication options. - Discuss potential use of metformin for prediabetes.  Obesity Obesity contributing to fatty liver and prediabetes. Discussed lifestyle modifications. - Encourage weight loss through diet and exercise.  Chronic bilateral low back pain with radiculopathy, left greater than right Pain well-managed with pregabalin . - Continue pregabalin  100 mg twice daily and 300 mg once daily. - Provide refill for pregabalin . - Provide refill for tramadol  50 mg, 180 pills.  Arthritis, multiple sites Arthritis contributing to back pain.  Numbness and tingling of both hands, possible nerve compression Likely due to nerve compression. Possible cervical spine or wrist involvement. - Refer to orthopedic specialist for evaluation and possible nerve conduction study.  Chronic insomnia Insomnia managed with zolpidem . No side effects reported. - Continue zolpidem  as needed. - Provide refill for zolpidem .  Urge incontinence Symptoms occur occasionally at night.

## 2023-10-27 NOTE — Progress Notes (Deleted)
 Name: Hayley Howe   MRN: [id number]    DOB: June 04, 1945   Date:10/27/2023       Progress Note  Subjective  Chief Complaint  Chief Complaint  Patient presents with  . Medical Management of Chronic Issues   {HPI:32069} *** Patient Active Problem List   Diagnosis Date Noted  . Sebaceous cyst 09/08/2022  . Severe persistent asthma without complication 07/14/2022  . History of total hip arthroplasty 01/05/2022  . Primary osteoarthritis of right hip 12/21/2021  . Anemia 12/21/2021  . HTN (hypertension) 08/06/2021  . HLD (hyperlipidemia) 08/06/2021  . Kidney lesion 08/06/2021  . Chronic rhinitis 07/02/2021  . Senile purpura (HCC) 05/14/2020  . Moderate persistent asthma with allergic rhinitis without complication 05/14/2020  . Pre-diabetes 05/14/2020  . Hyperglycemia 07/13/2017  . Chronic constipation 12/15/2016  . Primary osteoarthritis of left hip 11/05/2015  . Scoliosis 11/05/2015  . Chronic right-sided low back pain with right-sided sciatica 10/30/2014  . Benign essential HTN 10/27/2014  . Bradycardia 10/27/2014  . Chronic insomnia 10/27/2014  . Headache, temporal 10/27/2014  . Hypercholesteremia 10/27/2014  . Obesity (BMI 30-39.9) 10/27/2014  . Changing sleep-work schedule, affecting sleep 10/27/2014  . Vitamin D  deficiency 10/27/2014  . Arthritis of knee, degenerative 09/24/2009    Past Surgical History:  Procedure Laterality Date  . ABDOMINAL HYSTERECTOMY    . BREAST BIOPSY Right 01/21/2013   stereo - benign  . COLONOSCOPY WITH PROPOFOL  N/A 06/30/2016   Procedure: COLONOSCOPY WITH PROPOFOL ;  Surgeon: Ruel Kung, MD;  Location: [facility] ENDOSCOPY;  Service: Endoscopy;  Laterality: N/A;  . COLONOSCOPY WITH PROPOFOL  N/A 09/02/2021   Procedure: COLONOSCOPY WITH PROPOFOL ;  Surgeon: Kung Ruel, MD;  Location: [facility] ENDOSCOPY;  Service: Gastroenterology;  Laterality: N/A;  . KNEE SURGERY Left 2012   Dr. Kathlynn  . OOPHORECTOMY    . TUBAL LIGATION    . VAGINAL PROLAPSE REPAIR       Family History  Problem Relation Age of Onset  . Breast cancer Sister 45       in year 2013  . Hyperlipidemia Mother   . Diabetes Mother   . Cancer Father        Leukemia  . Hyperlipidemia Father   . Lung cancer Brother   . Breast cancer Sister   . Heart failure Brother     Social History   Tobacco Use  . Smoking status: Former    Current packs/day: 0.00    Average packs/day: 0.3 packs/day for 9.7 years (2.4 ttl pk-yrs)    Types: Cigarettes    Start date: 08/13/1967    Quit date: 04/18/1977    Years since quitting: 46.5    Passive exposure: Past  . Smokeless tobacco: Never  . Tobacco comments:    smoking cessation materials not required  Substance Use Topics  . Alcohol use: No    Alcohol/week: 0.0 standard drinks of alcohol     Current Outpatient Medications:  .  albuterol  (VENTOLIN  HFA) 108 (90 Base) MCG/ACT inhaler, Inhale 2 puffs into the lungs every 6 (six) hours as needed for wheezing or shortness of breath., Disp: 8 g, Rfl: 6 .  amLODipine  (NORVASC ) 10 MG tablet, Take 1 tablet (10 mg total) by mouth daily., Disp: 90 tablet, Rfl: 1 .  aspirin  EC 81 MG tablet, Take 1 tablet (81 mg total) by mouth daily., Disp: 30 tablet, Rfl: 0 .  Azelastine -Fluticasone  137-50 MCG/ACT SUSP, Place 1 spray into both nostrils 2 (two) times daily., Disp: 69 g, Rfl: 1 .  Cholecalciferol  (VITAMIN D ) 2000 UNITS tablet, Take 1 tablet by mouth daily., Disp: , Rfl:  .  DUPIXENT  300 MG/2ML SOAJ, INJECT 1 PEN SUBCUTANEOUSLY EVERY TWO WEEKS., Disp: 4 mL, Rfl: 11 .  fluticasone  furoate-vilanterol (BREO ELLIPTA ) 200-25 MCG/ACT AEPB, Inhale 1 puff into the lungs daily., Disp: 60 each, Rfl: 5 .  loratadine  (CLARITIN ) 10 MG tablet, Take 1 tablet (10 mg total) by mouth daily., Disp: 30 tablet, Rfl: 0 .  Magnesium  250 MG TABS, Take 1 tablet by mouth daily., Disp: , Rfl:  .  Multiple Vitamin (MULTIVITAMIN ADULT PO), Take by mouth., Disp: , Rfl:  .  pregabalin  (LYRICA ) 100 MG capsule, Take 1 capsule  (100 mg total) by mouth 2 (two) times daily., Disp: 180 capsule, Rfl: 0 .  pregabalin  (LYRICA ) 300 MG capsule, Take 1 capsule (300 mg total) by mouth at bedtime as needed., Disp: 90 capsule, Rfl: 0 .  rosuvastatin  (CRESTOR ) 10 MG tablet, Take 1 tablet (10 mg total) by mouth daily., Disp: 90 tablet, Rfl: 0 .  tolterodine  (DETROL  LA) 2 MG 24 hr capsule, Take 1 capsule (2 mg total) by mouth daily., Disp: 90 capsule, Rfl: 1 .  traMADol  (ULTRAM ) 50 MG tablet, Take 1 tablet (50 mg total) by mouth 2 (two) times daily. Chronic pain, Disp: 180 tablet, Rfl: 0 .  valsartan -hydrochlorothiazide  (DIOVAN -HCT) 160-12.5 MG tablet, Take 1 tablet by mouth daily., Disp: 90 tablet, Rfl: 1 .  vitamin C (ASCORBIC ACID ) 500 MG tablet, Take 1 tablet by mouth daily., Disp: , Rfl:  .  zolpidem  (AMBIEN  CR) 12.5 MG CR tablet, Take 1 tablet (12.5 mg total) by mouth at bedtime., Disp: 90 tablet, Rfl: 0  No Known Allergies  I personally reviewed {Reviewed:14835} with the patient/caregiver today.   ROS  ***  Objective Physical Exam   Vitals:   10/27/23 0858  BP: 132/80  Pulse: 82  Resp: 18  SpO2: 99%  Weight: 185 lb 8 oz (84.1 kg)  Height: 5' 4 (1.626 m)    Body mass index is 31.84 kg/m.  Recent Results (from the past 2160 hours)  Nitric oxide      Status: None   Collection Time: 08/25/23  8:48 AM  Result Value Ref Range   Nitric Oxide  14     Diabetic Foot Exam: {Perform Simple Foot Exam  Perform Detailed exam:1} {Insert foot Exam (Optional):30965}   PHQ2/9:    10/27/2023    8:58 AM 05/17/2023    8:38 AM 03/22/2023   10:33 AM 12/30/2022    7:59 AM 08/30/2022    7:38 AM  Depression screen PHQ 2/9  Decreased Interest 0 0 0 0 0  Down, Depressed, Hopeless 0 0 0 0 0  PHQ - 2 Score 0 0 0 0 0  Altered sleeping  0 0 0 0  Tired, decreased energy  0 0 0 0  Change in appetite  0 0 0 0  Feeling bad or failure about yourself   0 0 0 0  Trouble concentrating  0 0 0 0  Moving slowly or fidgety/restless  0  0 0 0  Suicidal thoughts  0 0 0 0  PHQ-9 Score  0 0 0 0  Difficult doing work/chores  Not difficult at all Not difficult at all      phq 9 is {gen pos wzh:684356}  Fall Risk:    10/27/2023    8:51 AM 05/17/2023    8:37 AM 03/22/2023   10:33 AM 12/30/2022    7:58 AM 08/30/2022  7:38 AM  Fall Risk   Falls in the past year? 0 0 0 0 0  Number falls in past yr: 0 0 0 0 0  Injury with Fall? 0 0 0 0 0  Risk for fall due to : No Fall Risks No Fall Risks No Fall Risks No Fall Risks No Fall Risks  Follow up Falls evaluation completed Falls prevention discussed;Education provided;Falls evaluation completed Falls prevention discussed;Education provided;Falls evaluation completed Falls prevention discussed Falls prevention discussed     {A&P:32071}

## 2023-10-28 LAB — COMPREHENSIVE METABOLIC PANEL WITH GFR
AG Ratio: 1.2 (calc) (ref 1.0–2.5)
ALT: 27 U/L (ref 6–29)
AST: 27 U/L (ref 10–35)
Albumin: 4.3 g/dL (ref 3.6–5.1)
Alkaline phosphatase (APISO): 69 U/L (ref 37–153)
BUN: 24 mg/dL (ref 7–25)
CO2: 27 mmol/L (ref 20–32)
Calcium: 9.8 mg/dL (ref 8.6–10.4)
Chloride: 104 mmol/L (ref 98–110)
Creat: 0.96 mg/dL (ref 0.60–1.00)
Globulin: 3.5 g/dL (ref 1.9–3.7)
Glucose, Bld: 84 mg/dL (ref 65–99)
Potassium: 4.2 mmol/L (ref 3.5–5.3)
Sodium: 140 mmol/L (ref 135–146)
Total Bilirubin: 0.3 mg/dL (ref 0.2–1.2)
Total Protein: 7.8 g/dL (ref 6.1–8.1)
eGFR: 61 mL/min/1.73m2 (ref >=60)

## 2023-10-28 LAB — CBC WITH DIFFERENTIAL/PLATELET
Absolute Lymphocytes: 2377 {cells}/uL (ref 850–3900)
Absolute Monocytes: 388 {cells}/uL (ref 200–950)
Basophils Absolute: 68 {cells}/uL (ref 0–200)
Basophils Relative: 1.2 %
Eosinophils Absolute: 148 {cells}/uL (ref 15–500)
Eosinophils Relative: 2.6 %
HCT: 38.7 % (ref 35.0–45.0)
Hemoglobin: 12.2 g/dL (ref 11.7–15.5)
MCH: 26.2 pg — ABNORMAL LOW (ref 27.0–33.0)
MCHC: 31.5 g/dL — ABNORMAL LOW (ref 32.0–36.0)
MCV: 83.2 fL (ref 80.0–100.0)
MPV: 11.2 fL (ref 7.5–12.5)
Monocytes Relative: 6.8 %
Neutro Abs: 2719 {cells}/uL (ref 1500–7800)
Neutrophils Relative %: 47.7 %
Platelets: 272 Thousand/uL (ref 140–400)
RBC: 4.65 Million/uL (ref 3.80–5.10)
RDW: 14.9 % (ref 11.0–15.0)
Total Lymphocyte: 41.7 %
WBC: 5.7 Thousand/uL (ref 3.8–10.8)

## 2023-10-28 LAB — LIPID PANEL
Cholesterol: 203 mg/dL — ABNORMAL HIGH (ref <200)
HDL: 91 mg/dL (ref >=50)
LDL Cholesterol (Calc): 97 mg/dL
Non-HDL Cholesterol (Calc): 112 mg/dL (ref <130)
Total CHOL/HDL Ratio: 2.2 (calc) (ref <5.0)
Triglycerides: 67 mg/dL (ref <150)

## 2023-10-30 ENCOUNTER — Ambulatory Visit: Payer: Self-pay | Admitting: Family Medicine

## 2023-10-30 ENCOUNTER — Other Ambulatory Visit (HOSPITAL_COMMUNITY): Payer: Self-pay

## 2023-11-10 ENCOUNTER — Other Ambulatory Visit: Payer: Self-pay | Admitting: Pulmonary Disease

## 2023-11-17 ENCOUNTER — Ambulatory Visit
Admission: RE | Admit: 2023-11-17 | Discharge: 2023-11-17 | Disposition: A | Discharge status: Home / Self Care | Source: Ambulatory Visit | Attending: Family Medicine | Admitting: Family Medicine

## 2023-11-17 DIAGNOSIS — Z1231 Encounter for screening mammogram for malignant neoplasm of breast: Secondary | ICD-10-CM | POA: Diagnosis present

## 2023-11-22 ENCOUNTER — Ambulatory Visit: Payer: Self-pay | Admitting: Internal Medicine

## 2023-11-30 ENCOUNTER — Ambulatory Visit

## 2023-12-29 ENCOUNTER — Other Ambulatory Visit (HOSPITAL_COMMUNITY): Payer: Self-pay

## 2023-12-29 ENCOUNTER — Ambulatory Visit (INDEPENDENT_AMBULATORY_CARE_PROVIDER_SITE_OTHER)

## 2023-12-29 ENCOUNTER — Telehealth: Payer: Self-pay | Admitting: Pharmacy Technician

## 2023-12-29 VITALS — BP 132/80 | Ht 64.0 in | Wt 180.0 lb

## 2023-12-29 DIAGNOSIS — Z2821 Immunization not carried out because of patient refusal: Secondary | ICD-10-CM | POA: Diagnosis not present

## 2023-12-29 DIAGNOSIS — Z0000 Encounter for general adult medical examination without abnormal findings: Secondary | ICD-10-CM

## 2023-12-29 NOTE — Progress Notes (Signed)
 Because this visit was a virtual/telehealth visit,  certain criteria was not obtained, such a blood pressure, CBG if applicable, and timed get up and go. Any medications not marked as taking were not mentioned during the medication reconciliation part of the visit. Any vitals not documented were not able to be obtained due to this being a telehealth visit or patient was unable to self-report a recent blood pressure reading due to a lack of equipment at home via telehealth. Vitals that have been documented are verbally provided by the patient.   This visit was performed by a medical professional under my direct supervision. I was immediately available for consultation/collaboration. I have reviewed and agree with the Annual Wellness Visit documentation.  Subjective:   Hayley Howe is a 78 y.o. who presents for a Medicare Wellness preventive visit.  As a reminder, Annual Wellness Visits don't include a physical exam, and some assessments may be limited, especially if this visit is performed virtually. We may recommend an in-person follow-up visit with your provider if needed.  Visit Complete: Virtual I connected with  Hayley Howe on 12/29/23 by a audio enabled telemedicine application and verified that I am speaking with the correct person using two identifiers.  Patient Location: Home  Provider Location: Home Office  I discussed the limitations of evaluation and management by telemedicine. The patient expressed understanding and agreed to proceed.  Vital Signs: Because this visit was a virtual/telehealth visit, some criteria may be missing or patient reported. Any vitals not documented were not able to be obtained and vitals that have been documented are patient reported.  VideoDeclined- This patient declined Librarian, academic. Therefore the visit was completed with audio only.  Persons Participating in Visit: Patient.  AWV Questionnaire: No: Patient  Medicare AWV questionnaire was not completed prior to this visit.  Cardiac Risk Factors include: advanced age (>30men, >56 women);obesity (BMI >30kg/m2);hypertension;dyslipidemia     Objective:    Today's Vitals   12/29/23 0949  BP: 132/80  Weight: 180 lb (81.6 kg)  Height: 5' 4 (1.626 m)  PainSc: 5    Body mass index is 30.9 kg/m.     12/29/2023    9:49 AM 04/25/2022    8:46 AM 09/02/2021    9:58 AM 08/06/2021    8:12 AM 04/22/2021    9:04 AM 04/21/2020   12:31 PM 01/24/2019    9:03 AM  Advanced Directives  Does Patient Have a Medical Advance Directive? Yes Yes Yes No Yes Yes Yes  Type of Estate agent of [place];Living will    Healthcare Power of [place];Living will Healthcare Power of [place];Living will Healthcare Power of [place];Living will  Does patient want to make changes to medical advance directive? No - Patient declined        Copy of Healthcare Power of Attorney in Chart? No - copy requested    No - copy requested No - copy requested No - copy requested  Would patient like information on creating a medical advance directive?    No - Patient declined       Current Medications (verified) Outpatient Encounter Medications as of 12/29/2023  Medication Sig   albuterol  (VENTOLIN  HFA) 108 (90 Base) MCG/ACT inhaler Inhale 2 puffs into the lungs every 6 (six) hours as needed for wheezing or shortness of breath.   amLODipine  (NORVASC ) 10 MG tablet Take 1 tablet (10 mg total) by mouth daily.   aspirin  EC 81 MG tablet Take 1 tablet (81  mg total) by mouth daily.   Azelastine -Fluticasone  137-50 MCG/ACT SUSP Place 1 spray into both nostrils 2 (two) times daily.   Cholecalciferol  (VITAMIN D ) 2000 UNITS tablet Take 1 tablet by mouth daily.   DUPIXENT  300 MG/2ML SOAJ INJECT 1 PEN SUBCUTANEOUSLY EVERY TWO WEEKS.   fluticasone  furoate-vilanterol (BREO ELLIPTA ) 200-25 MCG/ACT AEPB Inhale 1 puff by mouth once daily   loratadine  (CLARITIN ) 10 MG tablet Take 1 tablet  (10 mg total) by mouth daily.   Magnesium  250 MG TABS Take 1 tablet by mouth daily.   Multiple Vitamin (MULTIVITAMIN ADULT PO) Take by mouth.   pregabalin  (LYRICA ) 100 MG capsule Take 1 capsule (100 mg total) by mouth 2 (two) times daily.   pregabalin  (LYRICA ) 300 MG capsule Take 1 capsule (300 mg total) by mouth at bedtime as needed.   rosuvastatin  (CRESTOR ) 10 MG tablet Take 1 tablet (10 mg total) by mouth daily.   tolterodine  (DETROL  LA) 2 MG 24 hr capsule Take 1 capsule (2 mg total) by mouth daily.   traMADol  (ULTRAM ) 50 MG tablet Take 1 tablet (50 mg total) by mouth 2 (two) times daily. Chronic pain   valsartan -hydrochlorothiazide  (DIOVAN -HCT) 160-12.5 MG tablet Take 1 tablet by mouth daily.   vitamin C (ASCORBIC ACID ) 500 MG tablet Take 1 tablet by mouth daily.   zolpidem  (AMBIEN  CR) 12.5 MG CR tablet Take 1 tablet (12.5 mg total) by mouth at bedtime.   No facility-administered encounter medications on file as of 12/29/2023.    Allergies (verified) Patient has no known allergies.   History: Past Medical History:  Diagnosis Date   Asthma    Hyperlipidemia    Hypertension    Insomnia    Low back pain    Obesity    Osteoarthritis of left knee    Past Surgical History:  Procedure Laterality Date   ABDOMINAL HYSTERECTOMY     BREAST BIOPSY Right 01/21/2013   stereo - benign   COLONOSCOPY WITH PROPOFOL  N/A 06/30/2016   Procedure: COLONOSCOPY WITH PROPOFOL ;  Surgeon: Ruel Kung, MD;  Location: [facility] ENDOSCOPY;  Service: Endoscopy;  Laterality: N/A;   COLONOSCOPY WITH PROPOFOL  N/A 09/02/2021   Procedure: COLONOSCOPY WITH PROPOFOL ;  Surgeon: Kung Ruel, MD;  Location: [facility] ENDOSCOPY;  Service: Gastroenterology;  Laterality: N/A;   KNEE SURGERY Left 2012   Dr. 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  Tobacco Use   Smoking status: Former    Current packs/day: 0.00    Average packs/day: 0.3 packs/day for 9.7 years (2.4 ttl pk-yrs)    Types: Cigarettes    Start date: 08/13/1967    Quit date: 04/18/1977    Years since quitting: 66.7  Passive exposure: Past   Smokeless tobacco: Never   Tobacco comments:    smoking cessation materials not required  Vaping Use   Vaping status: Never Used  Substance and Sexual Activity   Alcohol use: No    Alcohol/week: 0.0 standard drinks of alcohol   Drug use: No   Sexual activity: Not Currently  Other Topics Concern   Not on file  Social History Narrative   Daughter lives with her and grandson   Still working full time at [employer] 3rd shift      Mother of Judit Awad   Social Drivers of Health   Financial Resource Strain: Low Risk  (12/29/2023)   Overall Financial Resource Strain (CARDIA)    Difficulty of Paying Living Expenses: Not hard at all  Food Insecurity: No Food Insecurity (12/29/2023)   Hunger Vital Sign    Worried About Running Out of Food in the Last Year: Never true    Ran Out of Food in the Last Year: Never true  Transportation Needs: No Transportation Needs (12/29/2023)   PRAPARE - Administrator, Civil Service (Medical): No    Lack of Transportation (Non-Medical): No  Physical Activity: Insufficiently Active (12/29/2023)   Exercise Vital Sign    Days of Exercise per Week: 4 days    Minutes of Exercise per Session: 30 min  Stress: No Stress Concern Present (12/29/2023)   [employer] of Occupational Health  - Occupational Stress Questionnaire    Feeling of Stress: Not at all  Social Connections: Moderately Integrated (12/29/2023)   Social Connection and Isolation Panel    Frequency of Communication with Friends and Family: More than three times a week    Frequency of Social Gatherings with Friends and Family: Twice a week    Attends Religious Services: More than 4 times per year    Active Member of [employer] or Organizations: Yes    Attends Banker Meetings: More than 4 times per year    Marital Status: Widowed    Tobacco Counseling Counseling given: Not Answered Tobacco comments: smoking cessation materials not required    Clinical Intake:  Pre-visit preparation completed: Yes  Pain : 0-10 Pain Score: 5  Pain Type: Chronic pain Pain Location: Back Pain Orientation: Lower Pain Descriptors / Indicators: Constant Pain Onset: Today Pain Frequency: Constant     BMI - recorded: 30.9 Nutritional Status: BMI > 30  Obese Nutritional Risks: None Diabetes: No  Lab Results  Component Value Date   HGBA1C 6.1 (H) 12/30/2022   HGBA1C 6.3 (H) 08/30/2022   HGBA1C 6.1 (H) 07/22/2021     How often do you need to have someone help you when you read instructions, pamphlets, or other written materials from your doctor or pharmacy?: 1 - Never  Interpreter Needed?: No  Information entered by :: Sophiya Morello,CMA   Activities of Daily Living     12/29/2023    9:52 AM 03/22/2023   10:33 AM  In your present state of health, do you have any difficulty performing the following activities:  Hearing? 0 0  Vision? 0 0  Difficulty concentrating or making decisions? 0 0  Walking or climbing stairs? 0 1  Dressing or bathing? 0 0  Doing errands, shopping? 0 0  Preparing Food and eating ? N   Using the Toilet? N   In the past six months, have you accidently leaked urine? N   Do you have problems with loss of bowel control? N   Managing  your Medications? N   Managing your  Finances? N   Housekeeping or managing your Housekeeping? N     Patient Care Team: Sowles, Krichna, MD as PCP - General (Family Medicine) Malvina Eck, DC as Consulting Physician (Chiropractic Medicine) Herminio Miu, MD as Consulting Physician (Otolaryngology) Therisa Bi, MD as Consulting Physician (Gastroenterology) Tamea Dedra CROME, MD as Consulting Physician (Pulmonary Disease) Leora Lynwood SAUNDERS, MD as Consulting Physician (Orthopedic Surgery)  I have updated your Care Teams any recent Medical Services you may have received from other providers in the past year.     Assessment:   This is a routine wellness examination for Zykia.  Hearing/Vision screen Hearing Screening - Comments:: No difficulties Vision Screening - Comments:: Patient wears glasses   Goals Addressed             This Visit's Progress    Patient Stated       Patient would like to retire       Depression Screen     12/29/2023    9:54 AM 10/27/2023    8:58 AM 05/17/2023    8:38 AM 03/22/2023   10:33 AM 12/30/2022    7:59 AM 08/30/2022    7:38 AM 04/29/2022    7:41 AM  PHQ 2/9 Scores  PHQ - 2 Score 0 0 0 0 0 0 0  PHQ- 9 Score 0  0 0 0 0 0    Fall Risk     12/29/2023    9:51 AM 10/27/2023    8:51 AM 05/17/2023    8:37 AM 03/22/2023   10:33 AM 12/30/2022    7:58 AM  Fall Risk   Falls in the past year? 0 0 0 0 0  Number falls in past yr: 0 0 0 0 0  Injury with Fall? 0 0 0 0 0  Risk for fall due to : No Fall Risks No Fall Risks No Fall Risks No Fall Risks No Fall Risks  Follow up Falls evaluation completed Falls evaluation completed Falls prevention discussed;Education provided;Falls evaluation completed Falls prevention discussed;Education provided;Falls evaluation completed Falls prevention discussed    MEDICARE RISK AT HOME:  Medicare Risk at Home Any stairs in or around the home?: No If so, are there any without handrails?: No Home free of loose throw rugs in walkways, pet beds,  electrical cords, etc?: Yes Adequate lighting in your home to reduce risk of falls?: Yes Life alert?: No Use of a cane, walker or w/c?: No Grab bars in the bathroom?: Yes Shower chair or bench in shower?: No Elevated toilet seat or a handicapped toilet?: Yes  TIMED UP AND GO:  Was the test performed?  No  Cognitive Function: 6CIT completed        12/29/2023    9:54 AM 04/25/2022    8:40 AM 01/24/2019    9:06 AM 01/19/2018    9:24 AM  6CIT Screen  What Year? 0 points 0 points 0 points 0 points  What month? 0 points 0 points 0 points 0 points  What time? 0 points 0 points 0 points 0 points  Count back from 20 0 points 2 points 0 points 0 points  Months in reverse 0 points 0 points 0 points 2 points  Repeat phrase 0 points 0 points 0 points 4 points  Total Score 0 points 2 points 0 points 6 points    Immunizations Immunization History  Administered Date(s) Administered    sv, Bivalent, Protein Subunit Rsvpref,pf (Abrysvo) 03/17/2022  Fluad Quad(high Dose 65+) 12/13/2018, 01/09/2020, 12/24/2020, 12/30/2022   INFLUENZA, HIGH DOSE SEASONAL PF 01/20/2016, 12/15/2016, 01/19/2018, 12/17/2021   Influenza, Seasonal, Injecte, Preservative Fre 02/18/2010, 02/11/2011, 03/29/2012   Influenza,inj,Quad PF,6+ Mos 01/03/2013, 01/10/2014, 03/05/2015   Influenza-Unspecified 12/17/2013   Moderna SARS-COV2 Booster Vaccination 02/17/2021   Moderna Sars-Covid-2 Vaccination 05/26/2019, 06/26/2019, 02/20/2020, 10/15/2020   Pneumococcal Conjugate-13 06/19/2014   Pneumococcal Polysaccharide-23 02/18/2010, 07/02/2015   Td 08/12/2016   Tdap 09/14/2007   Zoster Recombinant(Shingrix) 06/03/2021, 08/05/2021   Zoster, Live 08/20/2015    Screening Tests Health Maintenance  Topic Date Due   Influenza Vaccine  11/17/2023   COVID-19 Vaccine (6 - 2025-26 season) 12/18/2023   Mammogram  11/16/2024   Medicare Annual Wellness (AWV)  12/28/2024   DTaP/Tdap/Td (3 - Td or Tdap) 08/13/2026   Colonoscopy   09/03/2026   Pneumococcal Vaccine: 50+ Years  Completed   DEXA SCAN  Completed   Hepatitis C Screening  Completed   Zoster Vaccines- Shingrix  Completed   HPV VACCINES  Aged Out   Meningococcal B Vaccine  Aged Out    Health Maintenance Items Addressed:patient declined vaccination  Additional Screening:  Vision Screening: Recommended annual ophthalmology exams for early detection of glaucoma and other disorders of the eye. Is the patient up to date with their annual eye exam?  yes Who is the provider or what is the name of the office in which the patient attends annual eye exams?   Dental Screening: Recommended annual dental exams for proper oral hygiene  Community Resource Referral / Chronic Care Management: CRR required this visit?  No   CCM required this visit?  No   Plan:    I have personally reviewed and noted the following in the patient's chart:   Medical and social history Use of alcohol, tobacco or illicit drugs  Current medications and supplements including opioid prescriptions. Patient is not currently taking opioid prescriptions. Functional ability and status Nutritional status Physical activity Advanced directives List of other physicians Hospitalizations, surgeries, and ER visits in previous 12 months Vitals Screenings to include cognitive, depression, and falls Referrals and appointments  In addition, I have reviewed and discussed with patient certain preventive protocols, quality metrics, and best practice recommendations. A written personalized care plan for preventive services as well as general preventive health recommendations were provided to patient.   Lyle MARLA Right, [place]   12/29/2023   After Visit Summary: (MyChart) Due to this being a telephonic visit, the after visit summary with patients personalized plan was offered to patient via MyChart   Notes: Nothing significant to report at this time.

## 2023-12-29 NOTE — Patient Instructions (Signed)
 Hayley Howe,  Thank you for taking the time for your Medicare Wellness Visit. I appreciate your continued commitment to your health goals. Please review the care plan we discussed, and feel free to reach out if I can assist you further.  Medicare recommends these wellness visits once per year to help you and your care team stay ahead of potential health issues. These visits are designed to focus on prevention, allowing your provider to concentrate on managing your acute and chronic conditions during your regular appointments.  Please note that Annual Wellness Visits do not include a physical exam. Some assessments may be limited, especially if the visit was conducted virtually. If needed, we may recommend a separate in-person follow-up with your provider.  Ongoing Care Seeing your primary care provider every 3 to 6 months helps us  monitor your health and provide consistent, personalized care.   Referrals If a referral was made during today's visit and you haven't received any updates within two weeks, please contact the referred provider directly to check on the status.  Recommended Screenings:  Health Maintenance  Topic Date Due   Flu Shot  11/17/2023   COVID-19 Vaccine (6 - 2025-26 season) 12/18/2023   Breast Cancer Screening  11/16/2024   Medicare Annual Wellness Visit  12/28/2024   DTaP/Tdap/Td vaccine (3 - Td or Tdap) 08/13/2026   Colon Cancer Screening  09/03/2026   Pneumococcal Vaccine for age over 52  Completed   DEXA scan (bone density measurement)  Completed   Hepatitis C Screening  Completed   Zoster (Shingles) Vaccine  Completed   HPV Vaccine  Aged Out   Meningitis B Vaccine  Aged Out       12/29/2023    9:49 AM  Advanced Directives  Does Patient Have a Medical Advance Directive? Yes  Type of Estate agent of [place];Living will  Does patient want to make changes to medical advance directive? No - Patient declined  Copy of Healthcare Power of  Attorney in Chart? No - copy requested   Advance Care Planning is important because it: Ensures you receive medical care that aligns with your values, goals, and preferences. Provides guidance to your family and loved ones, reducing the emotional burden of decision-making during critical moments.  Vision: Annual vision screenings are recommended for early detection of glaucoma, cataracts, and diabetic retinopathy. These exams can also reveal signs of chronic conditions such as diabetes and high blood pressure.  Dental: Annual dental screenings help detect early signs of oral cancer, gum disease, and other conditions linked to overall health, including heart disease and diabetes.  Please see the attached documents for additional preventive care recommendations.

## 2023-12-29 NOTE — Telephone Encounter (Signed)
 Pharmacy Patient Advocate Encounter   Received notification from CoverMyMeds that prior authorization for traMADol  HCl 50MG  tablets  is required/requested.   Insurance verification completed.   The patient is insured through [facility] ADVANTAGE/RX ADVANCE . (New insurance than [employer])   Per test claim: Initial fill days exceeds limits  Per plan:   Her pharmacy will need to contact [employer] if they continue to have problems filling her tramadol 

## 2024-01-01 ENCOUNTER — Other Ambulatory Visit (HOSPITAL_COMMUNITY): Payer: Self-pay

## 2024-01-19 ENCOUNTER — Telehealth: Payer: Self-pay

## 2024-01-19 NOTE — Telephone Encounter (Signed)
 [place] renewal initiated automatically by CoverMyMeds.  Submitted a Prior Authorization request to [employer] for DUPIXENT  via CoverMyMeds. Will update once we receive a response.   [id number]

## 2024-01-23 NOTE — Telephone Encounter (Signed)
 Received notification from [facility] regarding a prior authorization for DUPIXENT . Authorization has been APPROVED from 01/23/24 to 01/18/25. Approval letter sent to scan center.  Authorization # [id number]  Sherry Pennant, PharmD, MPH, BCPS, CPP Clinical Pharmacist [facility])

## 2024-02-21 DIAGNOSIS — G5603 Carpal tunnel syndrome, bilateral upper limbs: Secondary | ICD-10-CM | POA: Insufficient documentation

## 2024-02-27 ENCOUNTER — Encounter: Payer: Self-pay | Admitting: Family Medicine

## 2024-02-27 ENCOUNTER — Ambulatory Visit: Attending: Family Medicine

## 2024-02-27 ENCOUNTER — Ambulatory Visit: Admitting: Family Medicine

## 2024-02-27 VITALS — BP 128/82 | HR 86 | Temp 98.2°F | Resp 18 | Ht 64.0 in | Wt 189.2 lb

## 2024-02-27 DIAGNOSIS — G8929 Other chronic pain: Secondary | ICD-10-CM | POA: Diagnosis not present

## 2024-02-27 DIAGNOSIS — I10 Essential (primary) hypertension: Secondary | ICD-10-CM

## 2024-02-27 DIAGNOSIS — J302 Other seasonal allergic rhinitis: Secondary | ICD-10-CM

## 2024-02-27 DIAGNOSIS — M170 Bilateral primary osteoarthritis of knee: Secondary | ICD-10-CM | POA: Diagnosis not present

## 2024-02-27 DIAGNOSIS — Z23 Encounter for immunization: Secondary | ICD-10-CM | POA: Diagnosis not present

## 2024-02-27 DIAGNOSIS — I700 Atherosclerosis of aorta: Secondary | ICD-10-CM

## 2024-02-27 DIAGNOSIS — J4550 Severe persistent asthma, uncomplicated: Secondary | ICD-10-CM | POA: Diagnosis not present

## 2024-02-27 DIAGNOSIS — R002 Palpitations: Secondary | ICD-10-CM

## 2024-02-27 DIAGNOSIS — M5442 Lumbago with sciatica, left side: Secondary | ICD-10-CM

## 2024-02-27 DIAGNOSIS — J3089 Other allergic rhinitis: Secondary | ICD-10-CM

## 2024-02-27 DIAGNOSIS — F5104 Psychophysiologic insomnia: Secondary | ICD-10-CM

## 2024-02-27 MED ORDER — TRAMADOL HCL 50 MG PO TABS: 50.0000 mg | ORAL_TABLET | Freq: Two times a day (BID) | ORAL | 0 refills | Status: AC

## 2024-02-27 NOTE — Progress Notes (Signed)
 Name: Hayley Howe   MRN: [id number]    DOB: 07/17/1945   Date:02/27/2024       Progress Note  Subjective  Chief Complaint  Chief Complaint  Patient presents with   Medical Management of Chronic Issues   Hyperlipidemia   Hypertension   Insomnia   Discussed the use of AI scribe software for clinical note transcription with the patient, who gave verbal consent to proceed.  History of Present Illness Hayley Howe is a 78 year old female with severe persistent asthma who presents for follow-up of her respiratory condition and other chronic health issues.  She has severe persistent asthma with exertional dyspnea. She is currently using albuterol  as a rescue inhaler, Breo 225, and Dupixent  300 mg biweekly, which she finds beneficial. She has an upcoming visit scheduled with Dr. Lenda  She experiences palpitations described as 'heart a flutter' once or twice weekly, without chest pain or dizziness.  Her history includes dyslipidemia and atherosclerosis, with a recent LDL increase from 71 to 97. She is on rosuvastatin  10 mg without issues. Her A1c remains stable at 6.1, indicating prediabetes.  She suffers from chronic pain due to primary osteoarthritis in both knees and chronic bilateral low back pain radiating to her left lower leg. She visits a chiropractor monthly and takes meloxicam 15 mg for inflammation and pregabalin  for pain management. Tramadol  is used as needed for pain, which sometimes reaches 7 or 8.  She has chronic insomnia, exacerbated by her third shift work schedule, and takes zolpidem  on work nights to aid sleep.  She experiences frequent urination and has been taking medication as needed for urgent incontinence, but plans to resume regular use due to increased frequency.  She has chronic rhinosinusitis and perennial allergic rhinitis, which worsens seasonally. She uses azelastine , fluticasone , and loratadine  for symptom management, and reports that Dupixent  may  also be helping with her allergies.    Patient Active Problem List   Diagnosis Date Noted   Bilateral carpal tunnel syndrome 02/21/2024   Left ventricular hypertrophy 10/27/2023   Perennial allergic rhinitis with seasonal variation 10/27/2023   Urge incontinence of urine 10/27/2023   Abdominal aortic atherosclerosis 10/27/2023   Sebaceous cyst 09/08/2022   Severe persistent asthma without complication (HCC) 07/14/2022   History of total hip arthroplasty 01/05/2022   Primary osteoarthritis of right hip 12/21/2021   Anemia 12/21/2021   HTN (hypertension) 08/06/2021   HLD (hyperlipidemia) 08/06/2021   Kidney lesion 08/06/2021   Chronic rhinitis 07/02/2021   Senile purpura 05/14/2020   Moderate persistent asthma with allergic rhinitis without complication 05/14/2020   Pre-diabetes 05/14/2020   Hyperglycemia 07/13/2017   Chronic constipation 12/15/2016   Primary osteoarthritis of left hip 11/05/2015   Scoliosis 11/05/2015   Chronic bilateral low back pain with left-sided sciatica 10/30/2014   Benign essential HTN 10/27/2014   Bradycardia 10/27/2014   Chronic insomnia 10/27/2014   Headache, temporal 10/27/2014   Hypercholesteremia 10/27/2014   Obesity (BMI 30-39.9) 10/27/2014   Changing sleep-work schedule, affecting sleep 10/27/2014   Vitamin D  deficiency 10/27/2014   Arthritis of knee, degenerative 09/24/2009    Past Surgical History:  Procedure Laterality Date   ABDOMINAL HYSTERECTOMY     BREAST BIOPSY Right 01/21/2013   stereo - benign   COLONOSCOPY WITH PROPOFOL  N/A 06/30/2016   Procedure: COLONOSCOPY WITH PROPOFOL ;  Surgeon: Ruel Kung, MD;  Location: [facility] ENDOSCOPY;  Service: Endoscopy;  Laterality: N/A;   COLONOSCOPY WITH PROPOFOL  N/A 09/02/2021   Procedure: COLONOSCOPY WITH PROPOFOL ;  Surgeon: Therisa Bi, MD;  Location: [facility] ENDOSCOPY;  Service: Gastroenterology;  Laterality: N/A;   KNEE SURGERY Left 2012   Dr. Kathlynn   OOPHORECTOMY     TUBAL LIGATION     VAGINAL  PROLAPSE REPAIR      Family History  Problem Relation Age of Onset   Breast cancer Sister 67       in year 2013   Hyperlipidemia Mother    Diabetes Mother    Cancer Father        Leukemia   Hyperlipidemia Father    Lung cancer Brother    Breast cancer Sister    Heart failure Brother     Social History   Tobacco Use   Smoking status: Former    Current packs/day: 0.00    Average packs/day: 0.3 packs/day for 9.7 years (2.4 ttl pk-yrs)    Types: Cigarettes    Start date: 08/13/1967    Quit date: 04/18/1977    Years since quitting: 46.8    Passive exposure: Past   Smokeless tobacco: Never   Tobacco comments:    smoking cessation materials not required  Substance Use Topics   Alcohol use: No    Alcohol/week: 0.0 standard drinks of alcohol     Current Outpatient Medications:    albuterol  (VENTOLIN  HFA) 108 (90 Base) MCG/ACT inhaler, Inhale 2 puffs into the lungs every 6 (six) hours as needed for wheezing or shortness of breath., Disp: 8 g, Rfl: 6   amLODipine  (NORVASC ) 10 MG tablet, Take 1 tablet (10 mg total) by mouth daily., Disp: 90 tablet, Rfl: 1   aspirin  EC 81 MG tablet, Take 1 tablet (81 mg total) by mouth daily., Disp: 30 tablet, Rfl: 0   Azelastine -Fluticasone  137-50 MCG/ACT SUSP, Place 1 spray into both nostrils 2 (two) times daily., Disp: 69 g, Rfl: 1   Cholecalciferol  (VITAMIN D ) 2000 UNITS tablet, Take 1 tablet by mouth daily., Disp: , Rfl:    DUPIXENT  300 MG/2ML SOAJ, INJECT 1 PEN SUBCUTANEOUSLY EVERY TWO WEEKS., Disp: 4 mL, Rfl: 11   fluticasone  furoate-vilanterol (BREO ELLIPTA ) 200-25 MCG/ACT AEPB, Inhale 1 puff by mouth once daily, Disp: 60 each, Rfl: 5   loratadine  (CLARITIN ) 10 MG tablet, Take 1 tablet (10 mg total) by mouth daily., Disp: 30 tablet, Rfl: 0   Magnesium  250 MG TABS, Take 1 tablet by mouth daily., Disp: , Rfl:    Multiple Vitamin (MULTIVITAMIN ADULT PO), Take by mouth., Disp: , Rfl:    pregabalin  (LYRICA ) 100 MG capsule, Take 1 capsule (100 mg  total) by mouth 2 (two) times daily., Disp: 180 capsule, Rfl: 1   pregabalin  (LYRICA ) 300 MG capsule, Take 1 capsule (300 mg total) by mouth at bedtime as needed., Disp: 90 capsule, Rfl: 1   rosuvastatin  (CRESTOR ) 10 MG tablet, Take 1 tablet (10 mg total) by mouth daily., Disp: 90 tablet, Rfl: 1   tolterodine  (DETROL  LA) 2 MG 24 hr capsule, Take 1 capsule (2 mg total) by mouth daily., Disp: 90 capsule, Rfl: 1   traMADol  (ULTRAM ) 50 MG tablet, Take 1 tablet (50 mg total) by mouth 2 (two) times daily. Chronic pain, Disp: 180 tablet, Rfl: 0   valsartan -hydrochlorothiazide  (DIOVAN -HCT) 160-12.5 MG tablet, Take 1 tablet by mouth daily., Disp: 90 tablet, Rfl: 1   vitamin C (ASCORBIC ACID ) 500 MG tablet, Take 1 tablet by mouth daily., Disp: , Rfl:    zolpidem  (AMBIEN  CR) 12.5 MG CR tablet, Take 1 tablet (12.5 mg total) by mouth at bedtime.,  Disp: 90 tablet, Rfl: 0  No Known Allergies  I personally reviewed active problem list, medication list, allergies, family history with the patient/caregiver today.   ROS  Ten systems reviewed and is negative except as mentioned in HPI    Objective Physical Exam VITALS: BP- 128/82 CONSTITUTIONAL: Patient appears well-developed and well-nourished. No distress. HEENT: Head atraumatic, normocephalic, neck supple. CARDIOVASCULAR: Normal rate, regular rhythm and normal heart sounds. No murmur heard. No BLE edema. PULMONARY: Effort normal and breath sounds normal. No respiratory distress. ABDOMINAL: There is no tenderness or distention. MUSCULOSKELETAL: Normal gait. Without gross motor or sensory deficit. PSYCHIATRIC: Patient has a normal mood and affect. Behavior is normal. Judgment and thought content normal.  Vitals:   02/27/24 0827  BP: 128/82  Pulse: 86  Resp: 18  Temp: 98.2 F (36.8 C)  SpO2: 98%  Weight: 189 lb 3.2 oz (85.8 kg)  Height: 5' 4 (1.626 m)    Body mass index is 32.48 kg/m.    PHQ2/9:    02/27/2024    8:27 AM 12/29/2023     9:54 AM 10/27/2023    8:58 AM 05/17/2023    8:38 AM 03/22/2023   10:33 AM  Depression screen PHQ 2/9  Decreased Interest 0 0 0 0 0  Down, Depressed, Hopeless 0 0 0 0 0  PHQ - 2 Score 0 0 0 0 0  Altered sleeping 0 0  0 0  Tired, decreased energy 0 0  0 0  Change in appetite 0 0  0 0  Feeling bad or failure about yourself  0 0  0 0  Trouble concentrating 0 0  0 0  Moving slowly or fidgety/restless 0 0  0 0  Suicidal thoughts 0 0  0 0  PHQ-9 Score 0 0   0  0   Difficult doing work/chores Not difficult at all Not difficult at all  Not difficult at all Not difficult at all     Data saved with a previous flowsheet row definition    phq 9 is negative  Fall Risk:    02/27/2024    8:27 AM 12/29/2023    9:51 AM 10/27/2023    8:51 AM 05/17/2023    8:37 AM 03/22/2023   10:33 AM  Fall Risk   Falls in the past year? 0 0 0 0 0  Number falls in past yr: 0 0 0 0 0  Injury with Fall? 0 0 0 0 0  Risk for fall due to :  No Fall Risks No Fall Risks No Fall Risks No Fall Risks  Follow up Falls evaluation completed Falls evaluation completed Falls evaluation completed Falls prevention discussed;Education provided;Falls evaluation completed Falls prevention discussed;Education provided;Falls evaluation completed     Assessment & Plan Severe persistent asthma Chronic condition with exertional dyspnea. Managed with albuterol , Breo, and Dupixent . Recent echocardiogram showed diastolic dysfunction, but no cardiac cause for dyspnea. - Continue albuterol  as rescue inhaler. - Continue Breo 225 mcg. - Continue Dupixent  300 mg biweekly. - Pulmonary function test scheduled for Friday. - Follow-up with pulmonologist next Tuesday or Wednesday.  Chronic bilateral knee osteoarthritis and chronic bilateral low back pain with left lower leg radiation Chronic pain managed with meloxicam and pregabalin . Pain level 5/10, occasionally reaching 7-8/10. Tramadol  used as needed for pain management. - Continue meloxicam  15 mg. - Continue pregabalin  300 mg at bedtime. - Prescribed tramadol  for pain management. - Continue chiropractic care monthly.  Essential hypertension Blood pressure improved to 128/82 mmHg. Managed  with valsartan  HCTZ and amlodipine . - Continue valsartan  HCTZ 160/12.5 mg. - Continue amlodipine  10 mg.  Atherosclerosis of aorta and dyslipidemia Dyslipidemia with LDL increased from 71 to 97 mg/dL. Managed with rosuvastatin  10 mg. Discussed increasing rosuvastatin  to 20 mg, but she prefers dietary modifications. - Continue rosuvastatin  10 mg. - Advised dietary modifications to reduce LDL.  Prediabetes A1c at 6.1%. Discussed dietary modifications to reduce carbohydrate intake. - Advised reducing intake of sweet beverages, desserts, potatoes, pasta, and rice. - Encouraged balanced meals with vegetables and lean proteins.  Other allergic rhinitis Symptoms well-controlled with azelastine , fluticasone , and loratadine . - Continue azelastine , fluticasone , and loratadine .  Palpitations Intermittent palpitations occurring once or twice a week. No associated chest pain or dizziness. Plan to evaluate with Zio patch to assess heart rhythm. - Ordered Zio patch for heart rhythm monitoring.  Psychophysiologic insomnia Insomnia managed with zolpidem  on nights working third shift. Discussed importance of sleep hygiene and reducing caffeine intake. - Continue zolpidem  as needed for sleep. - Advised reducing caffeine intake.  Urgency incontinence Increased frequency of urination. Discussed reducing caffeine intake to manage symptoms. - Advised reducing caffeine intake.  General Health Maintenance Received flu shot today. - Continue routine health maintenance.

## 2024-02-28 ENCOUNTER — Encounter

## 2024-02-28 ENCOUNTER — Ambulatory Visit: Admitting: Pulmonary Disease

## 2024-03-01 ENCOUNTER — Ambulatory Visit

## 2024-03-01 DIAGNOSIS — R0609 Other forms of dyspnea: Secondary | ICD-10-CM

## 2024-03-01 DIAGNOSIS — J4550 Severe persistent asthma, uncomplicated: Secondary | ICD-10-CM | POA: Diagnosis not present

## 2024-03-01 LAB — PULMONARY FUNCTION TEST
DL/VA % pred: 108 %
DL/VA: 4.48 ml/min/mmHg/L
DLCO unc % pred: 84 %
DLCO unc: 15.83 ml/min/mmHg
FEF 25-75 Post: 1.52 L/s
FEF 25-75 Pre: 2.28 L/s
FEF2575-%Change-Post: -33 %
FEF2575-%Pred-Post: 94 %
FEF2575-%Pred-Pre: 141 %
FEV1-%Change-Post: -5 %
FEV1-%Pred-Post: 87 %
FEV1-%Pred-Pre: 92 %
FEV1-Post: 1.82 L
FEV1-Pre: 1.92 L
FEV1FVC-%Change-Post: -9 %
FEV1FVC-%Pred-Pre: 111 %
FEV6-%Change-Post: 7 %
FEV6-%Pred-Post: 91 %
FEV6-%Pred-Pre: 85 %
FEV6-Post: 2.41 L
FEV6-Pre: 2.24 L
FEV6FVC-%Change-Post: 0 %
FEV6FVC-%Pred-Post: 104 %
FEV6FVC-%Pred-Pre: 105 %
FVC-%Change-Post: 5 %
FVC-%Pred-Post: 87 %
FVC-%Pred-Pre: 83 %
FVC-Post: 2.43 L
FVC-Pre: 2.31 L
Post FEV1/FVC ratio: 75 %
Post FEV6/FVC ratio: 99 %
Pre FEV1/FVC ratio: 83 %
Pre FEV6/FVC Ratio: 100 %
RV % pred: 71 %
RV: 1.65 L
TLC % pred: 78 %
TLC: 3.95 L

## 2024-03-01 NOTE — Patient Instructions (Signed)
 Full PFT completed today ? ?

## 2024-03-01 NOTE — Progress Notes (Signed)
 Full PFT completed today ? ?

## 2024-03-05 ENCOUNTER — Ambulatory Visit: Payer: Self-pay | Admitting: Pulmonary Disease

## 2024-03-05 ENCOUNTER — Ambulatory Visit (INDEPENDENT_AMBULATORY_CARE_PROVIDER_SITE_OTHER): Admitting: Pulmonary Disease

## 2024-03-05 ENCOUNTER — Encounter: Payer: Self-pay | Admitting: Pulmonary Disease

## 2024-03-05 VITALS — BP 126/66 | HR 75 | Temp 97.9°F | Ht 64.0 in | Wt 191.0 lb

## 2024-03-05 DIAGNOSIS — R7689 Other specified abnormal immunological findings in serum: Secondary | ICD-10-CM

## 2024-03-05 DIAGNOSIS — J4550 Severe persistent asthma, uncomplicated: Secondary | ICD-10-CM | POA: Diagnosis not present

## 2024-03-05 DIAGNOSIS — J329 Chronic sinusitis, unspecified: Secondary | ICD-10-CM

## 2024-03-05 NOTE — Patient Instructions (Signed)
 VISIT SUMMARY:  Today, you had a follow-up appointment to review your asthma and discuss your occasional heart palpitations. Your asthma is well-controlled, and you have not needed to use your rescue inhaler recently. You also mentioned experiencing occasional heart palpitations, and you are currently wearing a patch to monitor your heart rhythm.  YOUR PLAN:  -ASTHMA: Asthma is a condition where your airways narrow and swell, making it difficult to breathe. Your asthma is well-controlled with your current treatment, and you have not needed to use your rescue inhaler recently. We will continue with your current asthma management regimen and may consider reducing your Breo dose at your next refill due to your good control.  -PALPITATIONS: Palpitations are feelings of having a fast-beating, fluttering, or pounding heart. You have reported occasional palpitations while at rest. Previous tests showed normal valve function and mild enlargement of your heart chambers, but no significant abnormalities. You are currently wearing a patch to monitor your heart rhythm.  INSTRUCTIONS:  Please continue with your current asthma management regimen. We will consider reducing your Breo dose at your next refill. Continue wearing the heart rhythm monitoring patch, and we will review the results at your next appointment.

## 2024-03-05 NOTE — Progress Notes (Signed)
 Subjective:    Patient ID: Hayley Howe, female    DOB: 1945/08/20, 78 y.o.   MRN: [id number]  Patient Care Team: Sowles, Krichna, MD as PCP - General (Family Medicine) Malvina Eck, DC as Consulting Physician (Chiropractic Medicine) Herminio Miu, MD as Consulting Physician (Otolaryngology) Therisa Bi, MD as Consulting Physician (Gastroenterology) Tamea Dedra CROME, MD as Consulting Physician (Pulmonary Disease) Leora Lynwood SAUNDERS, MD as Consulting Physician (Orthopedic Surgery)  Chief Complaint  Patient presents with   Asthma    BACKGROUND/INTERVAL:Charon is a 78 year old very remote former smoker with minimal tobacco exposure, who presents for follow-up on the issue of severe persistent asthma,allergic phenotype,and chronic rhinosinusitis.  This is a scheduled visit. She was last seen on 25 Aug 2023 by me.  At that time she was noted to be well-controlled on Dupixent , Breo and as needed albuterol . She started Dupixent  on March 2023, she has been tolerating it well.   HPI Discussed the use of AI scribe software for clinical note transcription with the patient, who gave verbal consent to proceed.  History of Present Illness   Hayley Howe is a 78 year old female with asthma who presents for follow-up of her condition.  Her asthma is currently well-controlled, and she confirms that the Dupixent  she is receiving continues to be effective. She has not needed to use her rescue inhaler, albuterol , recently. She has received her flu vaccine.  She is currently on Breo 200, 1 puff daily.  She experiences occasional heart palpitations, describing them as episodes of her heart 'just be racing' even when sitting. These episodes are infrequent. She is currently wearing a ZIO patch to monitor her heart rhythm.  This is being worked up by her primary care physician.  Overall she feels well and looks well.    TEST/EVENTS : PFTs on February 28, 2019 showed normal lung function with an  FEV1 at 87%, ratio 72, FVC 94%, no significant bronchodilator response CT chest November 2020 mild bronchial wall thickening with bronchial secretions and associated scarring and linear atelectasis in the right middle lobe, left upper lobe and lingula.  Otherwise clear with no evidence of interstitial lung disease, findings of healed granulomatous disease in the right lower lobe CT sinus April 08, 2019 extensive acute on chronic sinusitis, air-fluid levels in the maxillary sinuses, minimal improvement and left frontal sinus and scattered ethmoid air cells Allergen panel, January 2023 absolute eosinophil count 500, IgE 545, allergen panel negative except for Cladosporium Herbarum IgE (common mold)  07/14/2022 PFTs: FEV1 1.97 L or 91% predicted, FVC 2.38 L or 83% predicted, FEV1/FVC 83%, no significant bronchodilator response. Diffusion capacity normal.  Compared to PFTs from 2020, no significant change. 07/07/2023 echocardiogram: LVEF is 60 to 65% no regional wall motion abnormalities.  Mild LVH, grade 1 DD.  Right ventricular systolic function normal.  Right ventricular size normal.  Left atrial size mildly dilated.  No valvular abnormalities. 03/01/2024 PFTs: FEV1 1.92 L or 92% predicted, FVC 2.31 L or 83% predicted, FEV1/FVC 83%, no bronchodilator response, flow-volume loop shows minimal obstructive pattern.  Mild decrease in lung volumes with significant decrease in ERV likely due to obesity.  Diffusion capacity normal.  Compared to PFTs performed 14 July 2022 there has been no significant change  Review of Systems A 10 point review of systems was performed and it is as noted above otherwise negative.   Patient Active Problem List   Diagnosis Date Noted   Bilateral carpal tunnel syndrome 02/21/2024   Left  ventricular hypertrophy 10/27/2023   Perennial allergic rhinitis with seasonal variation 10/27/2023   Urge incontinence of urine 10/27/2023   Abdominal aortic atherosclerosis 10/27/2023    Sebaceous cyst 09/08/2022   Severe persistent asthma without complication (HCC) 07/14/2022   History of total hip arthroplasty 01/05/2022   Primary osteoarthritis of right hip 12/21/2021   Anemia 12/21/2021   HTN (hypertension) 08/06/2021   HLD (hyperlipidemia) 08/06/2021   Kidney lesion 08/06/2021   Chronic rhinitis 07/02/2021   Senile purpura 05/14/2020   Moderate persistent asthma with allergic rhinitis without complication 05/14/2020   Pre-diabetes 05/14/2020   Hyperglycemia 07/13/2017   Chronic constipation 12/15/2016   Primary osteoarthritis of left hip 11/05/2015   Scoliosis 11/05/2015   Chronic bilateral low back pain with left-sided sciatica 10/30/2014   Benign essential HTN 10/27/2014   Bradycardia 10/27/2014   Chronic insomnia 10/27/2014   Headache, temporal 10/27/2014   Hypercholesteremia 10/27/2014   Obesity (BMI 30-39.9) 10/27/2014   Changing sleep-work schedule, affecting sleep 10/27/2014   Vitamin D  deficiency 10/27/2014   Arthritis of knee, degenerative 09/24/2009    Social History   Tobacco Use   Smoking status: Former    Current packs/day: 0.00    Average packs/day: 0.3 packs/day for 9.7 years (2.4 ttl pk-yrs)    Types: Cigarettes    Start date: 08/13/1967    Quit date: 04/18/1977    Years since quitting: 46.9    Passive exposure: Past   Smokeless tobacco: Never   Tobacco comments:    smoking cessation materials not required  Substance Use Topics   Alcohol use: No    Alcohol/week: 0.0 standard drinks of alcohol    No Known Allergies  Current Meds  Medication Sig   albuterol  (VENTOLIN  HFA) 108 (90 Base) MCG/ACT inhaler Inhale 2 puffs into the lungs every 6 (six) hours as needed for wheezing or shortness of breath.   amLODipine  (NORVASC ) 10 MG tablet Take 1 tablet (10 mg total) by mouth daily.   aspirin  EC 81 MG tablet Take 1 tablet (81 mg total) by mouth daily.   Azelastine -Fluticasone  137-50 MCG/ACT SUSP Place 1 spray into both nostrils 2 (two)  times daily.   Cholecalciferol  (VITAMIN D ) 2000 UNITS tablet Take 1 tablet by mouth daily.   DUPIXENT  300 MG/2ML SOAJ INJECT 1 PEN SUBCUTANEOUSLY EVERY TWO WEEKS.   fluticasone  furoate-vilanterol (BREO ELLIPTA ) 200-25 MCG/ACT AEPB Inhale 1 puff by mouth once daily   loratadine  (CLARITIN ) 10 MG tablet Take 1 tablet (10 mg total) by mouth daily.   Magnesium  250 MG TABS Take 1 tablet by mouth daily.   meloxicam (MOBIC) 15 MG tablet Take 15 mg by mouth daily.   Multiple Vitamin (MULTIVITAMIN ADULT PO) Take by mouth.   pregabalin  (LYRICA ) 100 MG capsule Take 1 capsule (100 mg total) by mouth 2 (two) times daily.   pregabalin  (LYRICA ) 300 MG capsule Take 1 capsule (300 mg total) by mouth at bedtime as needed.   rosuvastatin  (CRESTOR ) 10 MG tablet Take 1 tablet (10 mg total) by mouth daily.   tolterodine  (DETROL  LA) 2 MG 24 hr capsule Take 1 capsule (2 mg total) by mouth daily.   traMADol  (ULTRAM ) 50 MG tablet Take 1 tablet (50 mg total) by mouth 2 (two) times daily. Chronic pain   valsartan -hydrochlorothiazide  (DIOVAN -HCT) 160-12.5 MG tablet Take 1 tablet by mouth daily.   vitamin C (ASCORBIC ACID ) 500 MG tablet Take 1 tablet by mouth daily.   zolpidem  (AMBIEN  CR) 12.5 MG CR tablet Take 1 tablet (12.5 mg  total) by mouth at bedtime.    Immunization History  Administered Date(s) Administered    sv, Bivalent, Protein Subunit Rsvpref,pf (Abrysvo) 03/17/2022   Fluad Quad(high Dose 65+) 12/13/2018, 01/09/2020, 12/24/2020, 12/30/2022   INFLUENZA, HIGH DOSE SEASONAL PF 01/20/2016, 12/15/2016, 01/19/2018, 12/17/2021, 02/27/2024   Influenza, Seasonal, Injecte, Preservative Fre 02/18/2010, 02/11/2011, 03/29/2012   Influenza,inj,Quad PF,6+ Mos 01/03/2013, 01/10/2014, 03/05/2015   Influenza-Unspecified 12/17/2013   Moderna SARS-COV2 Booster Vaccination 02/17/2021   Moderna Sars-Covid-2 Vaccination 05/26/2019, 06/26/2019, 02/20/2020, 10/15/2020   Pneumococcal Conjugate-13 06/19/2014   Pneumococcal  Polysaccharide-23 02/18/2010, 07/02/2015   Td 08/12/2016   Tdap 09/14/2007   Zoster Recombinant(Shingrix) 06/03/2021, 08/05/2021   Zoster, Live 08/20/2015        Objective:     BP 126/66   Pulse 75   Temp 97.9 F (36.6 C) (Oral)   Ht 5' 4 (1.626 m)   Wt 191 lb (86.6 kg)   SpO2 99%   BMI 32.79 kg/m   SpO2: 99 %  GENERAL: Well-developed, overweight woman in no acute distress, well groomed.  Ambulatory with assistance of a cane.  No conversational dyspnea.  Speech clear. HEAD: Normocephalic, atraumatic.  Nasal quality to speech. EYES: Pupils equal, round, reactive to light.  No scleral icterus.  MOUTH: Poor dentition, some missing teeth, oral mucosa moist.  No thrush.   NECK: Supple. No thyromegaly. Trachea midline. No JVD.  No adenopathy. PULMONARY: Good air entry bilaterally.  No adventitious sounds. CARDIOVASCULAR: S1 and S2. Regular rate and rhythm.  Grade 1/6 systolic ejection murmur left sternal border. No gallops or rubs.   GASTROINTESTINAL: Benign. MUSCULOSKELETAL: No joint deformity, no clubbing, no edema.  NEUROLOGIC: No focal deficit, uses cane for gait assistance, speech is fluent.   SKIN: Intact,warm,dry.  No overt rashes noted. PSYCH: Mood and behavior appropriate        Assessment & Plan:     ICD-10-CM   1. Severe persistent asthma without complication (HCC)  J45.50     2. Chronic rhinosinusitis  J32.9     3. Elevated IgE level  R76.89      Discussion:    Severe persistent asthma without complication Well-controlled with current treatment regimen.  Pulmonary function testing is stable. No recent use of rescue inhaler (albuterol ). - Continue current asthma management regimen. - Will consider reducing Breo dose at next refill to Breo 100, due to good control with current treatment.  Palpitations Intermittent palpitations reported, occurring occasionally while at rest. Previous heart test showed normal valve function and mild cardiac chamber  enlargement (left atrium), but no significant abnormalities.  Under investigation.  Chronic rhinosinusitis/elevated IgE level Clinically controlled with Dupixent .   Advised if symptoms do not improve or worsen, to please contact office for sooner follow up or seek emergency care.    I spent 30 minutes of dedicated to the care of this patient on the date of this encounter to include pre-visit review of records, face-to-face time with the patient discussing conditions above, post visit ordering of testing, clinical documentation with the electronic health record, making appropriate referrals as documented, and communicating necessary findings to members of the patients care team.     C. Leita Sanders, MD Advanced Bronchoscopy PCCM [facility]    *This note was generated using voice recognition software/Dragon and/or AI transcription program.  Despite best efforts to proofread, errors can occur which can change the meaning. Any transcriptional errors that result from this process are unintentional and may not be fully corrected at the time of dictation.

## 2024-03-24 DIAGNOSIS — R002 Palpitations: Secondary | ICD-10-CM | POA: Diagnosis not present

## 2024-03-25 ENCOUNTER — Ambulatory Visit: Payer: Self-pay | Admitting: Family Medicine

## 2024-06-28 ENCOUNTER — Ambulatory Visit: Admitting: Family Medicine

## 2025-01-03 ENCOUNTER — Ambulatory Visit
